# Patient Record
Sex: Female | Born: 2000 | Race: White | Hispanic: No | Marital: Single | Smoking: Never smoker
Health system: Southern US, Community
[De-identification: ages and names within clinical notes are randomized; demographics above are authoritative.]

## PROBLEM LIST (undated history)

## (undated) DIAGNOSIS — D649 Anemia, unspecified: Secondary | ICD-10-CM

## (undated) DIAGNOSIS — J309 Allergic rhinitis, unspecified: Secondary | ICD-10-CM

## (undated) DIAGNOSIS — G43D0 Abdominal migraine, not intractable: Secondary | ICD-10-CM

## (undated) DIAGNOSIS — E039 Hypothyroidism, unspecified: Secondary | ICD-10-CM

## (undated) DIAGNOSIS — T7840XA Allergy, unspecified, initial encounter: Secondary | ICD-10-CM

## (undated) HISTORY — DX: Allergy, unspecified, initial encounter: T78.40XA

## (undated) HISTORY — PX: WISDOM TOOTH EXTRACTION: SHX21

## (undated) HISTORY — DX: Allergic rhinitis, unspecified: J30.9

## (undated) HISTORY — PX: CHOLECYSTECTOMY: SHX55

## (undated) HISTORY — DX: Anemia, unspecified: D64.9

---
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
## 2019-04-02 ENCOUNTER — Other Ambulatory Visit: Payer: Self-pay

## 2019-04-02 NOTE — Progress Notes (Signed)
Prescreened pt over the phone for visit Doximity visit on Monday 4-20

## 2019-04-06 ENCOUNTER — Other Ambulatory Visit: Payer: Self-pay

## 2019-04-06 ENCOUNTER — Encounter: Payer: Self-pay | Admitting: Gastroenterology

## 2019-04-06 ENCOUNTER — Ambulatory Visit (INDEPENDENT_AMBULATORY_CARE_PROVIDER_SITE_OTHER): Payer: Medicaid Other | Admitting: Gastroenterology

## 2019-04-06 VITALS — Ht 67.0 in | Wt 155.0 lb

## 2019-04-06 DIAGNOSIS — R109 Unspecified abdominal pain: Secondary | ICD-10-CM | POA: Diagnosis not present

## 2019-04-06 DIAGNOSIS — F1210 Cannabis abuse, uncomplicated: Secondary | ICD-10-CM

## 2019-04-06 DIAGNOSIS — R112 Nausea with vomiting, unspecified: Secondary | ICD-10-CM | POA: Diagnosis not present

## 2019-04-06 DIAGNOSIS — Z8669 Personal history of other diseases of the nervous system and sense organs: Secondary | ICD-10-CM | POA: Diagnosis not present

## 2019-04-06 MED ORDER — NORTRIPTYLINE HCL 10 MG PO CAPS: 10.0000 mg | ORAL_CAPSULE | Freq: Every day | ORAL | 1 refills | Status: DC

## 2019-04-06 MED ORDER — OMEPRAZOLE 40 MG PO CPDR: 40.0000 mg | DELAYED_RELEASE_CAPSULE | Freq: Every day | ORAL | 3 refills | Status: DC

## 2019-04-06 MED ORDER — DICYCLOMINE HCL 10 MG PO CAPS: 10.0000 mg | ORAL_CAPSULE | Freq: Three times a day (TID) | ORAL | 2 refills | Status: DC | PRN

## 2019-04-06 MED ORDER — ONDANSETRON 4 MG PO TBDP: 4.0000 mg | ORAL_TABLET | Freq: Three times a day (TID) | ORAL | 1 refills | Status: DC | PRN

## 2019-04-06 NOTE — Patient Instructions (Addendum)
If you are age 19 or older, your body mass index should be between 23-30. Your Body mass index is 24.28 kg/m. If this is out of the aforementioned range listed, please consider follow up with your Primary Care Provider.  If you are age 3 or younger, your body mass index should be between 19-25. Your Body mass index is 24.28 kg/m. If this is out of the aformentioned range listed, please consider follow up with your Primary Care Provider.    We have sent the following medications to your pharmacy for you to pick up at your convenience: Zofran 4mg  ODT: Take every 8 hours as needed Bentyl 10mg : Take every 8 hours as needed Omeprazole 40mg : Take daily Nortriptyline 10mg : Take one tablet at bedtime for 2 weeks then increase to 2 tablets by mouth at bedtime  Please go to the lab in the basement of our building to have lab work done. Hit "B" for basement when you get on the elevator.  When the doors open the lab is on your left.  We will call you with the results. Thank you.  We have emailed you a handout regarding Cannabinoid Hyperemesis Syndrome. Let us know if you do not receive it.  Thank you for entrusting me with your care and for choosing [facility], Dr. Ileene Patrick

## 2019-04-06 NOTE — Progress Notes (Signed)
Virtual Visit via Video Note  I connected with Kathleen Grimes on 04/06/19 at  9:00 AM EDT by a video enabled telemedicine application and verified that I am speaking with the correct person using two identifiers.   I discussed the limitations of evaluation and management by telemedicine and the availability of in person appointments. The patient expressed understanding and agreed to proceed.   THIS ENCOUNTER IS A VIRTUAL VISIT DUE TO COVID-19 - PATIENT WAS NOT SEEN IN THE OFFICE. PATIENT HAS CONSENTED TO VIRTUAL VISIT / TELEMEDICINE VISIT USING DOXIMITY   Location of patient: home Location of provider: office Name of referring provider: [facility], no physician listed Persons participating: myself, patient   HPI :  18 y/o female with a prior reported history of "abdominal migraines", cyclical vomiting syndrome, migraine headaches, referred here for a new patient visit for nausea / vomiting, abdominal pains.   Patient reports years of chronic nausea / vomiting, abdominal pains dating back to childhood. She has been seen by pediatric GI in the past and carries a diagnosis of abdominal migraines / cyclical vomiting syndrome. Her symptoms are chronic and stable over time.   She occasionally has burning chest pain in her right chest at times. No dysphagia. She is bothered by borborygmi at times. She has nausea frequently which is usually postprandial, but can happen at any time. She feels nausea with all meals. She has vomiting about twice per month, it can happen at any time. She can have episodes of vomiting that are severe and can last for hours at a times. She has chronic abdominal discomfort, she feels cramps in lower abdomen frequently. Pains are below her umbilicus in the lower midline. She has fluctuating bowel habits, one day constipation and the other stools can be loose, and then normal times as well. No blood in the stools. She does have some reliable relief of  her lower pain with a bowel movement. Sometimes she has vomiting and nausea with bowel movements.   She has migraine headaches frequently, usually 5 out of 7 days of the week she will be bothered by headaches. She is not using anything preventatively to treat this. She denies any NSAID use.  She will go to the ED for symptoms when severe, seen in [facility], roughly 4 times since October. She has spells which can be severe with frequent vomiting.   She does not think she's had a CT scan in the past, only ultrasound which was done in 07/2014 and was normal. No gallstones.  Normal LFTs and CBC done on 03/30/19  No prior EGD or colonoscopy.  No FH of malignancy known.   She was given Zofran in the ED which helps her symptoms when she has them.   No tobacco use. No alcohol use. 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 She reports showers do not often help.     Past Medical History:  Diagnosis Date  . Abdominal migraine   . Allergic rhinitis 03/13/2013  . Allergy   . Anemia      Past Surgical History:  Procedure Laterality Date  . WISDOM TOOTH EXTRACTION     Family History  Problem Relation Age of Onset  . Breast cancer Paternal Grandmother   . Cancer Maternal Great-grandmother        bone marrow  . Colon cancer Neg Hx   .  Esophageal cancer Neg Hx   . Stomach cancer Neg Hx    Social History   Tobacco Use  . Smoking status: Never Smoker  . Smokeless tobacco: Never Used  Substance Use Topics  . Alcohol use: No  . Drug use: No   Current Outpatient Medications  Medication Sig Dispense Refill  . ondansetron (ZOFRAN-ODT) 4 MG disintegrating tablet Take 4 mg by mouth every 8 (eight) hours as needed for nausea or vomiting.    . potassium chloride (K-DUR) 10 MEQ tablet Take 10 mEq by mouth 2 (two) times daily.     No current facility-administered medications for this  visit.    No Known Allergies   Review of Systems: All systems reviewed and negative except where noted in HPI.   Labs per HPI  Lab Results  Component Value Date   WBC 11.4 08/02/2017   HGB 13.6 08/02/2017   HCT 39.8 08/02/2017   MCV 91.7 08/02/2017   PLT 340 08/02/2017    Lab Results  Component Value Date   CREATININE 0.66 08/02/2017   BUN 13 08/02/2017   NA 139 08/02/2017   K 4.0 08/02/2017   CL 104 08/02/2017   CO2 22 08/02/2017      Physical Exam: Ht 5\' 7"  (1.702 m) Comment: pt provided over the phone  Wt 155 lb (70.3 kg) Comment: pt provided over the phone  LMP 03/30/2019 (Exact Date)   BMI 24.28 kg/m   NA  ASSESSMENT AND PLAN:  18 y/o female here for a new patient consultation regarding the following:  Nausea with vomiting / abdominal pain / migraine headaches / marijuana use - I discussed the ddx with the patient. I'm concerned about her frequent marijuana use and possibility for cannabinoid hyperemesis syndrome. We discussed what this was and will email her some educational information on this. With all of these symptoms she should stop using marijuana completely until this is sorted out, she agreed with this. Otherwise I will screen her for H pylori using IgG serology, if positive will treat. She has not had a good trial of PPI that I can see, will give her omeprazole 40mg  once daily to take as well as Zofran PRN. Her migraine headaches are poorly controlled and that could also be driving some of her symptoms, I discussed options and think she is a good candidate for a TCA. Will try Nortryptylline to start, 10mg  qHS for 2 weeks then double dose to 20mg . Counseled her on risks / benefits and she wanted to try it. If this does not control migraines will have her see PCP or neurology for other options. Otherwise will try some bentyl to use PRN for cramps, suspect she could have IBS in regards to her low symptoms. Will also screen for celiac disease to ensure normal.    She agreed. Her labs are normal and reassuring. Will see how she responds to these measures and await her course. If no improvement moving forward, next stop is to consider imaging with CT or endoscopic evaluation. She agreed and will keep me updated if no better.  Ileene PatrickSteven Hansika Leaming, MD [facility]  CC: [facility]

## 2019-04-08 ENCOUNTER — Other Ambulatory Visit (INDEPENDENT_AMBULATORY_CARE_PROVIDER_SITE_OTHER): Payer: Medicaid Other

## 2019-04-08 DIAGNOSIS — F1210 Cannabis abuse, uncomplicated: Secondary | ICD-10-CM | POA: Diagnosis not present

## 2019-04-08 DIAGNOSIS — R109 Unspecified abdominal pain: Secondary | ICD-10-CM

## 2019-04-08 DIAGNOSIS — R112 Nausea with vomiting, unspecified: Secondary | ICD-10-CM

## 2019-04-08 DIAGNOSIS — Z8669 Personal history of other diseases of the nervous system and sense organs: Secondary | ICD-10-CM | POA: Diagnosis not present

## 2019-04-08 LAB — H. PYLORI ANTIBODY, IGG: H Pylori IgG: NEGATIVE

## 2019-04-08 LAB — IGA: IgA: 127 mg/dL (ref 68–378)

## 2019-04-10 LAB — TISSUE TRANSGLUTAMINASE, IGA: (tTG) Ab, IgA: 1 U/mL

## 2019-04-13 NOTE — Progress Notes (Unsigned)
Called patient and gave lab results. Patient states she has been taking the Omeprazole, zofran, Nortryptylline and bentyl for 1 week and it is helping. She has cut back on her marijuana use from 3gm daily to 1 gm daily. Told her that was good, but continue to try to stop

## 2019-04-14 IMAGING — US ULTRASOUND RIGHT BREAST LIMITED
1 series · 2 of 2 positions shown · non-contrast
Comparison: None.

CLINICAL DATA: 17-year-old female with a palpable abnormality
involving the upper-outer quadrant of the right breast.

EXAM:
ULTRASOUND OF THE RIGHT BREAST

[Series 1: ultrasound right breast limited · 0.07mm/px · 2 of 2 slices shown]
[im 1/2]
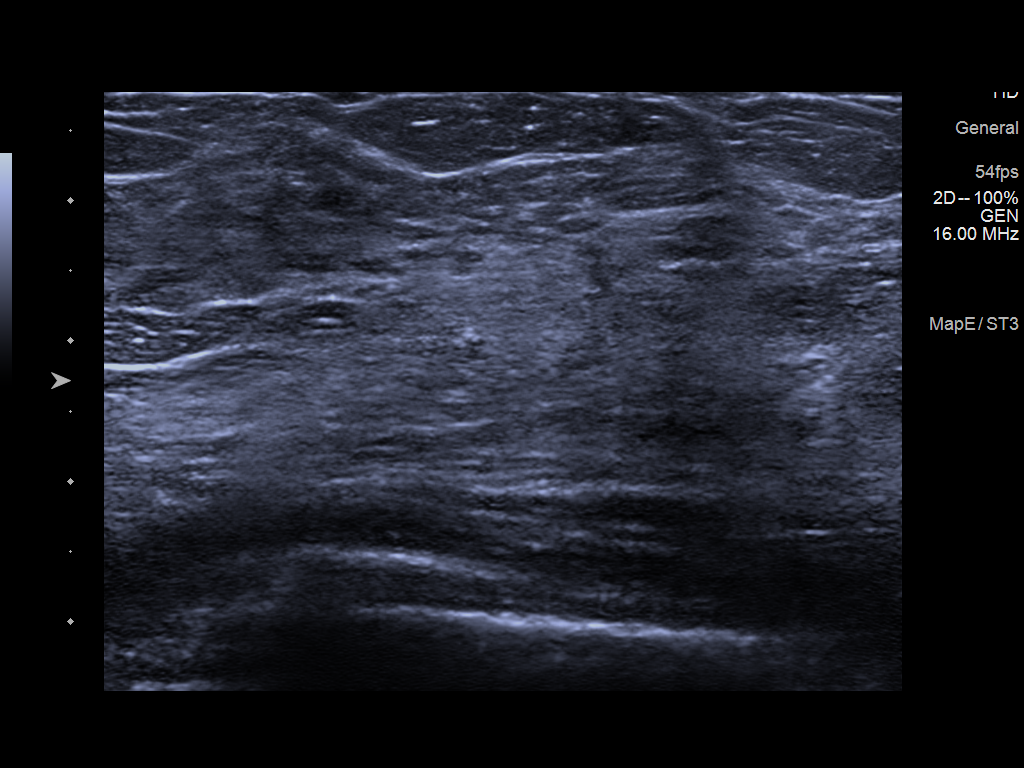
[im 2/2]
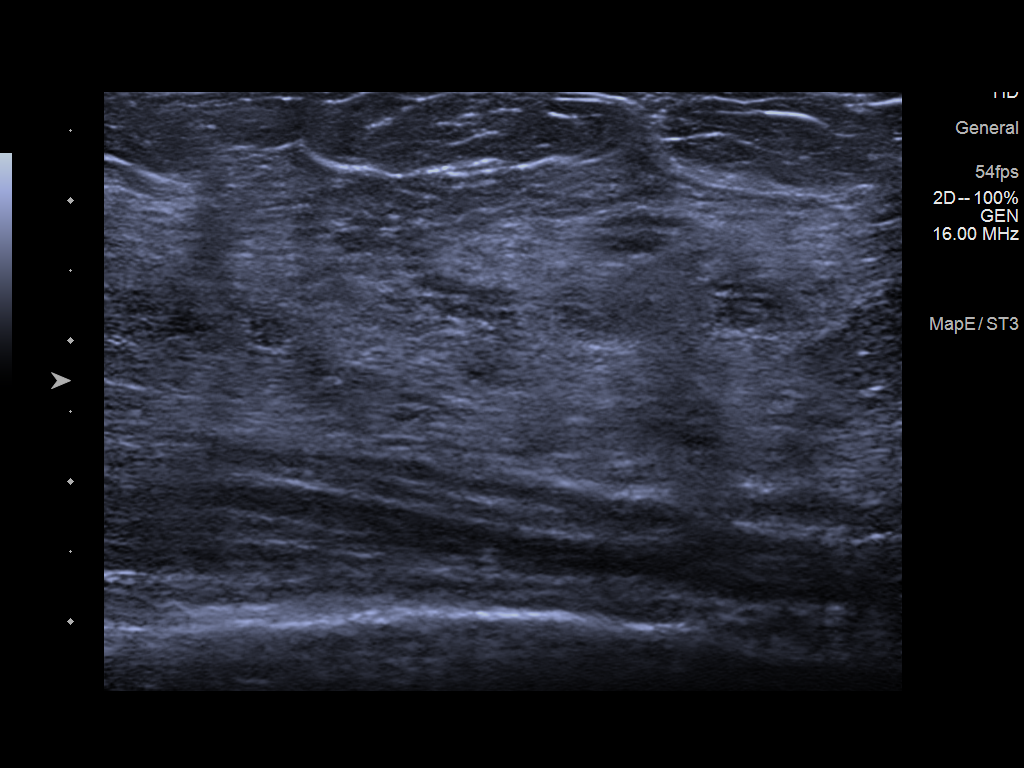

[2 of 2 positions shown; findings below may reference images not displayed]

FINDINGS: Physical examination site of palpable concern reveals a large area
of thickening involving the upper-outer right breast. No discrete
masses.

Targeted ultrasound of the upper-outer right breast was performed.
No suspicious masses or abnormality seen, only extremely dense
fibroglandular tissue identified.
IMPRESSION: No abnormalities identified at site of palpable concern in the
upper-outer right breast. This palpable area of concern could be due
to extremely dense fibroglandular tissue in this location.

RECOMMENDATION:
1. Recommend further management of the right breast palpable
abnormality be based on clinical assessment.

2. Screening mammogram at age 40 unless there are persistent or
intervening clinical concerns. (Code:QG-I-BI9)

I have discussed the findings and recommendations with the patient.
Results were also provided in writing at the conclusion of the
visit. If applicable, a reminder letter will be sent to the patient
regarding the next appointment.

BI-RADS CATEGORY  1: Negative.

## 2019-05-15 IMAGING — DX DG CHEST 2V
2 series · 2 of 2 positions shown · non-contrast
Comparison: July 25, 2017

CLINICAL DATA: Wheezing

EXAM:
CHEST - 2 VIEW

[chest pa]
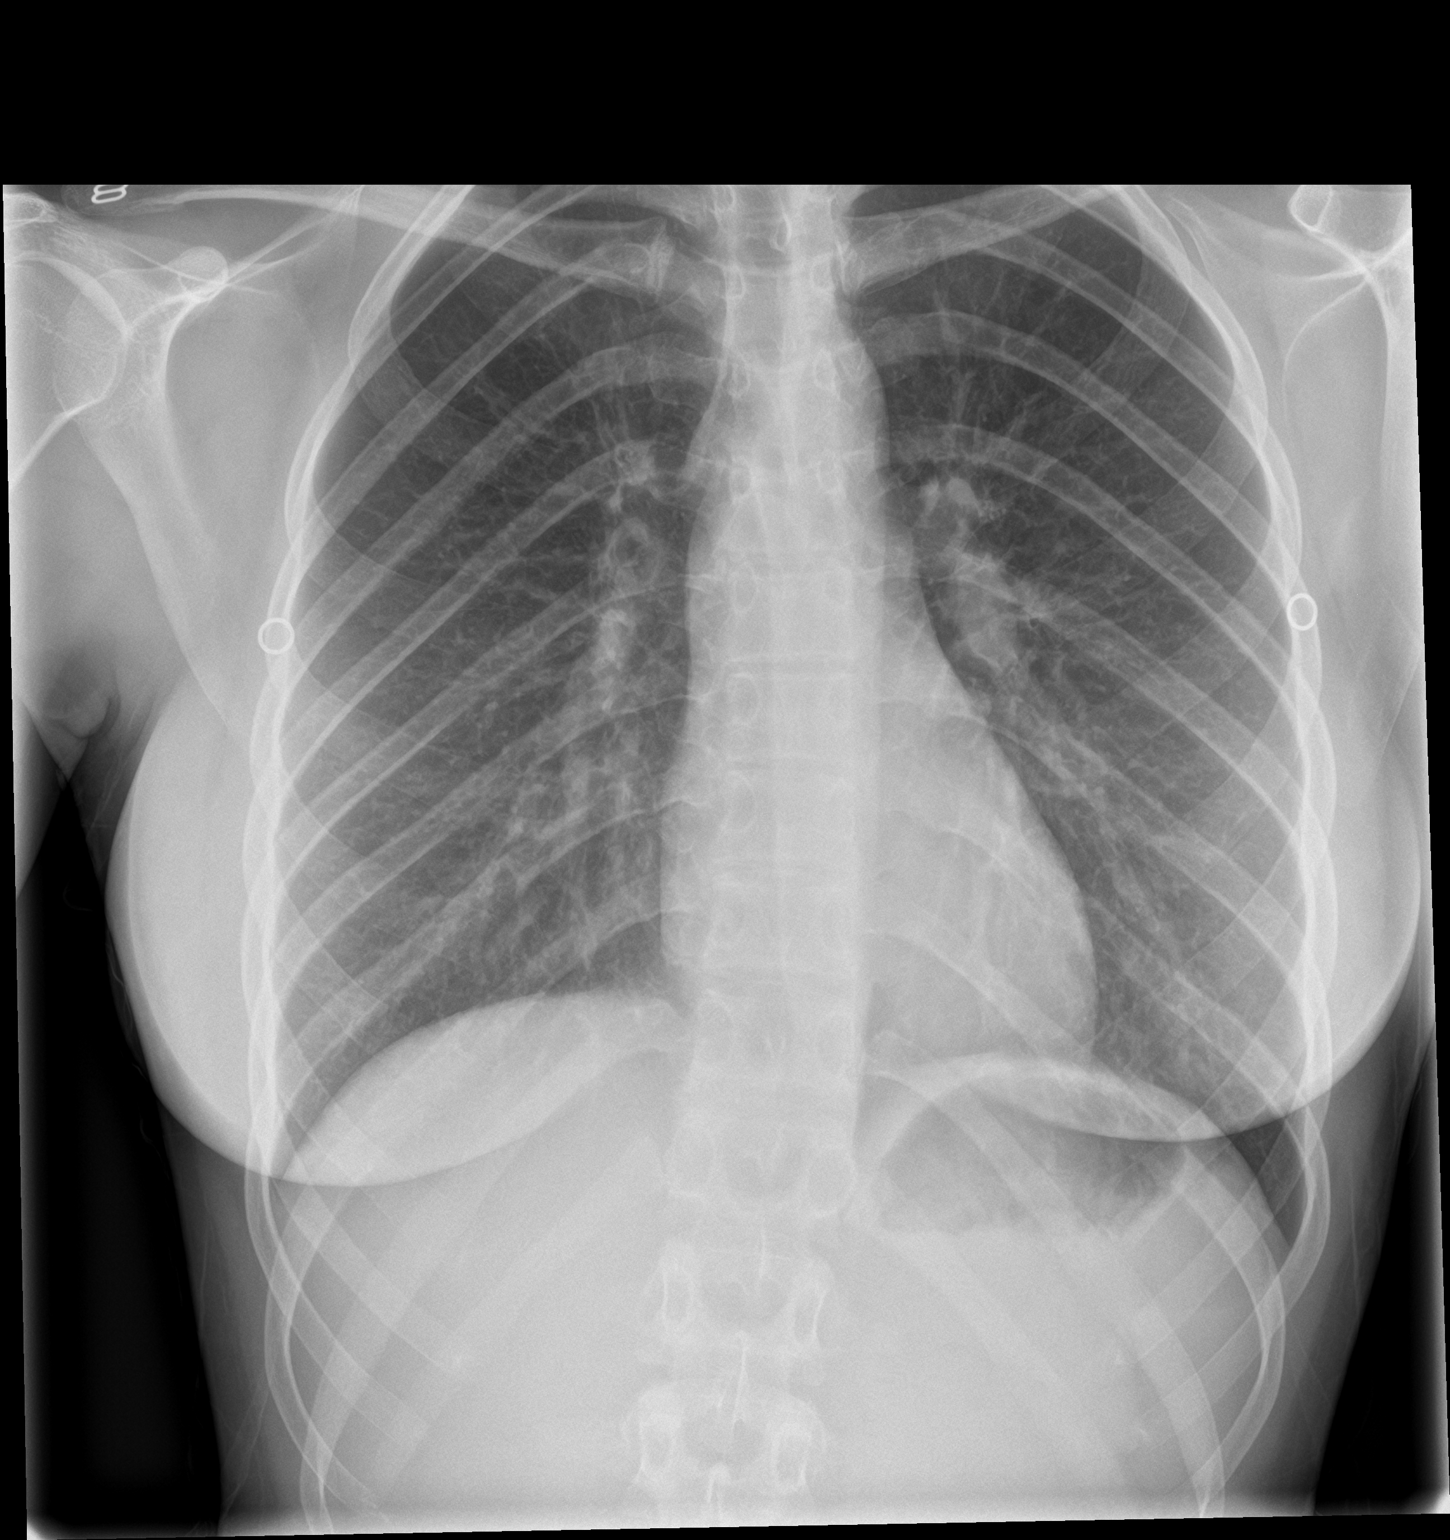

[chest lat]
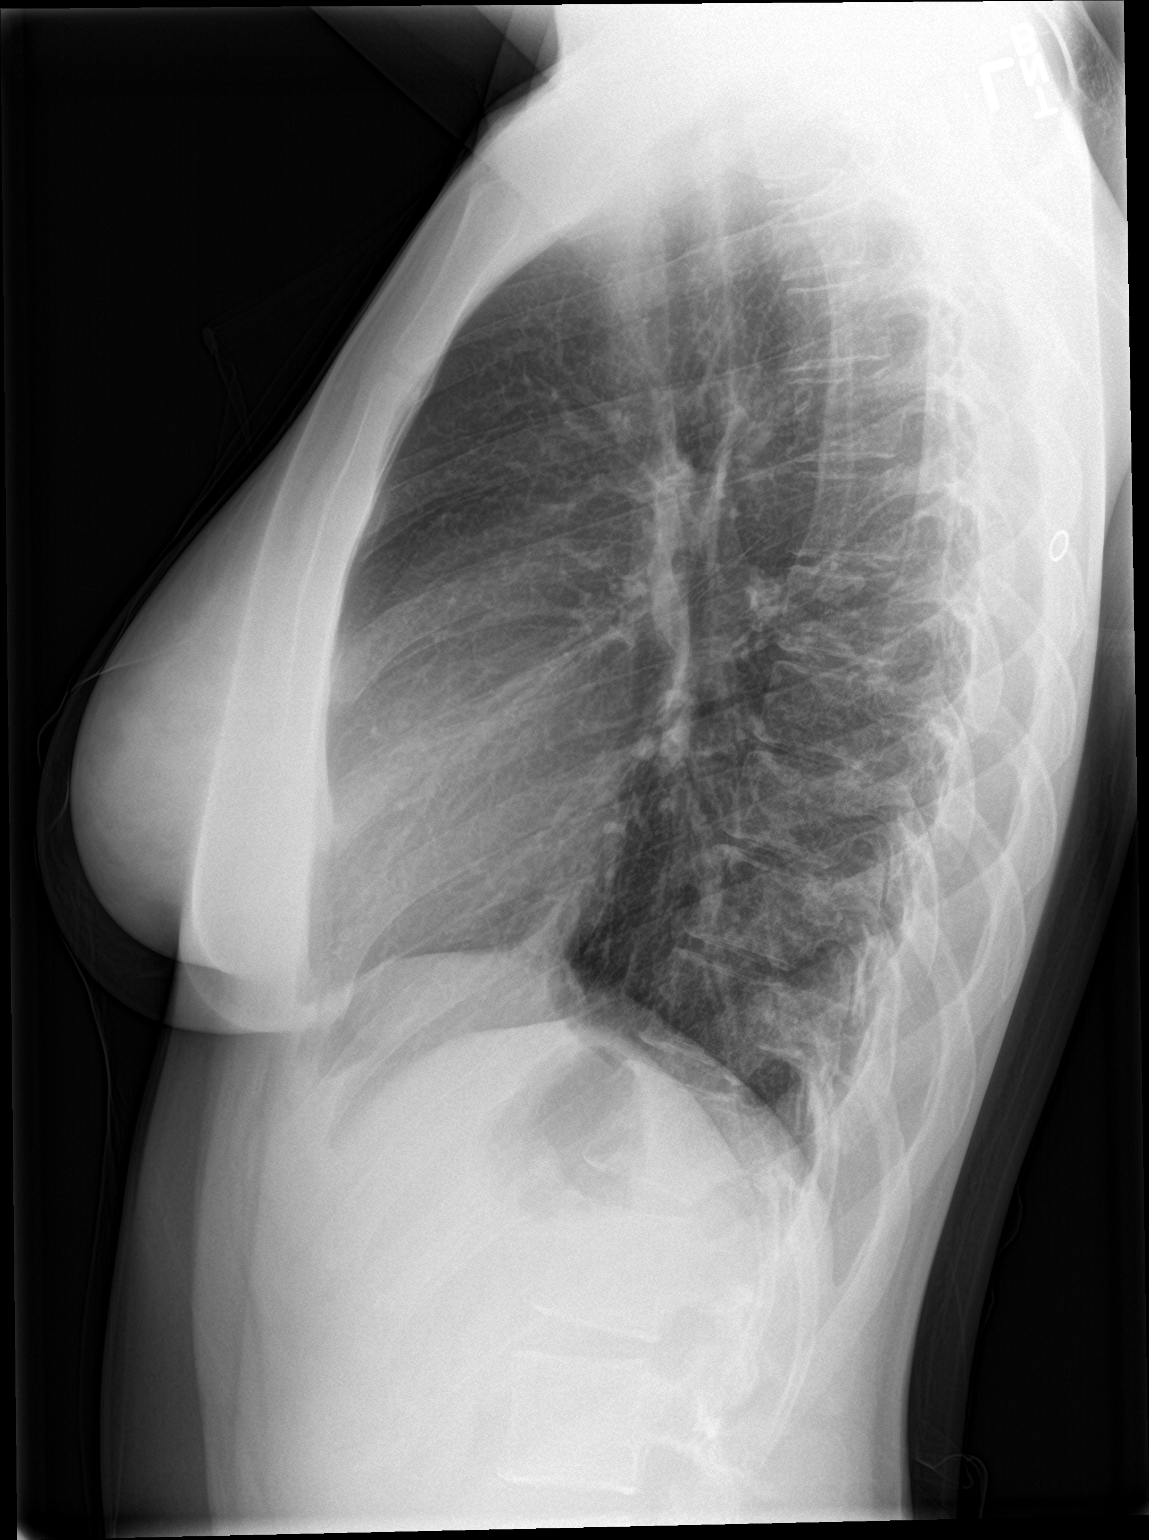

[2 of 2 positions shown; findings below may reference images not displayed]

FINDINGS: The lungs are clear. The heart size and pulmonary vascularity are
normal. No adenopathy. No bone lesions.
IMPRESSION: No edema or consolidation.

## 2019-10-22 ENCOUNTER — Other Ambulatory Visit: Payer: Self-pay

## 2019-10-22 ENCOUNTER — Encounter (HOSPITAL_COMMUNITY): Payer: Self-pay

## 2019-10-22 ENCOUNTER — Emergency Department (HOSPITAL_COMMUNITY)
Admission: EM | Admit: 2019-10-22 | Discharge: 2019-10-22 | Disposition: A | Discharge status: Home / Self Care | Payer: Medicaid Other | Attending: Emergency Medicine | Admitting: Emergency Medicine

## 2019-10-22 DIAGNOSIS — R1110 Vomiting, unspecified: Secondary | ICD-10-CM | POA: Diagnosis not present

## 2019-10-22 DIAGNOSIS — Z5321 Procedure and treatment not carried out due to patient leaving prior to being seen by health care provider: Secondary | ICD-10-CM | POA: Insufficient documentation

## 2019-10-22 DIAGNOSIS — R109 Unspecified abdominal pain: Secondary | ICD-10-CM | POA: Diagnosis present

## 2019-10-22 NOTE — ED Notes (Signed)
Called for Pt in waiting room. No answer.

## 2019-10-22 NOTE — ED Triage Notes (Signed)
Pt arrives from home via POV c/o severe abdominal pain and vomiting. Pt presents anxious and distressed. Pt states she lost count of how many times she has vomitted. Pt states she needs pain medicine and needs to be seen as soon as possible.

## 2019-12-18 NOTE — L&D Delivery Note (Addendum)
LABOR COURSE Patient was an induction for post dates. Labor course was uncomplicated. S/p cytotec x1 and pitocin. Patient progressed to complete shortly after.   Delivery Note Called to room and patient was complete and pushing. Head delivered OA. No nuchal cord present. Shoulder and body delivered in usual fashion. At 1933 a healthy female was delivered via Vaginal, Spontaneous (Presentation:LOA).  Infant with spontaneous cry, placed on mother's abdomen, dried and stimulated. Cord clamped x 2 after 1-minute delay, and cut by Grandmother of baby. Cord blood drawn. Placenta delivered spontaneously with gentle cord traction. Appears intact. Fundus firm with massage and Pitocin. Labia, perineum, vagina, and cervix inspected with first degree perineal laceration.   APGAR: 8,9 ; weight per medical record Cord: 3 vessel cord, membranes are intact.    Anesthesia: IV fentanyl, lidocaine for lac repair Episiotomy: None Lacerations: First degree perineal laceration, repaired in standard fashion without complication Suture Repair: 3.0 vicryl Est. Blood Loss (mL): 75 ml  Mom to postpartum.  Baby to Couplet care / Skin to Skin.  Betti Cruz, MD PGY-1 11/14/2020 8:07 PM    I was gloved and present for delivery of infant and placenta. I assisted with laceration repair as noted above and agree with resident's documentation.  Sheila Oats, MD OB Fellow, Faculty Practice 11/14/2020 9:22 PM

## 2020-03-17 ENCOUNTER — Other Ambulatory Visit: Payer: Self-pay | Admitting: Obstetrics and Gynecology

## 2020-03-17 DIAGNOSIS — O3680X0 Pregnancy with inconclusive fetal viability, not applicable or unspecified: Secondary | ICD-10-CM

## 2020-03-21 ENCOUNTER — Other Ambulatory Visit: Payer: Self-pay

## 2020-03-21 ENCOUNTER — Ambulatory Visit (INDEPENDENT_AMBULATORY_CARE_PROVIDER_SITE_OTHER): Payer: Medicaid Other

## 2020-03-21 DIAGNOSIS — O3680X0 Pregnancy with inconclusive fetal viability, not applicable or unspecified: Secondary | ICD-10-CM | POA: Diagnosis not present

## 2020-03-21 DIAGNOSIS — Z3A01 Less than 8 weeks gestation of pregnancy: Secondary | ICD-10-CM | POA: Diagnosis not present

## 2020-03-21 NOTE — Progress Notes (Signed)
[name] 7+1 wks,single IUP with YS,positive FHT 132 bpm,normal ovaries,CRL 11.10 mm

## 2020-03-24 ENCOUNTER — Emergency Department (HOSPITAL_COMMUNITY)
Admission: EM | Admit: 2020-03-24 | Discharge: 2020-03-24 | Disposition: A | Discharge status: Home / Self Care | Payer: Medicaid Other | Attending: Emergency Medicine | Admitting: Emergency Medicine

## 2020-03-24 ENCOUNTER — Other Ambulatory Visit: Payer: Self-pay

## 2020-03-24 ENCOUNTER — Telehealth: Payer: Self-pay | Admitting: Women's Health

## 2020-03-24 ENCOUNTER — Encounter (HOSPITAL_COMMUNITY): Payer: Self-pay | Admitting: *Deleted

## 2020-03-24 ENCOUNTER — Emergency Department (HOSPITAL_COMMUNITY): Payer: Medicaid Other

## 2020-03-24 DIAGNOSIS — R7989 Other specified abnormal findings of blood chemistry: Secondary | ICD-10-CM

## 2020-03-24 DIAGNOSIS — E876 Hypokalemia: Secondary | ICD-10-CM

## 2020-03-24 DIAGNOSIS — Z79899 Other long term (current) drug therapy: Secondary | ICD-10-CM | POA: Insufficient documentation

## 2020-03-24 DIAGNOSIS — O219 Vomiting of pregnancy, unspecified: Secondary | ICD-10-CM | POA: Diagnosis present

## 2020-03-24 DIAGNOSIS — Z3A01 Less than 8 weeks gestation of pregnancy: Secondary | ICD-10-CM | POA: Insufficient documentation

## 2020-03-24 DIAGNOSIS — R945 Abnormal results of liver function studies: Secondary | ICD-10-CM | POA: Insufficient documentation

## 2020-03-24 LAB — URINALYSIS, ROUTINE W REFLEX MICROSCOPIC
Glucose, UA: NEGATIVE mg/dL
Hgb urine dipstick: NEGATIVE
Ketones, ur: 80 mg/dL — AB
Nitrite: NEGATIVE
Protein, ur: 30 mg/dL — AB
Specific Gravity, Urine: 1.028 (ref 1.005–1.030)
pH: 6 (ref 5.0–8.0)

## 2020-03-24 LAB — CBC WITH DIFFERENTIAL/PLATELET
Abs Immature Granulocytes: 0.04 10*3/uL (ref 0.00–0.07)
Basophils Absolute: 0 10*3/uL (ref 0.0–0.1)
Basophils Relative: 0 %
Eosinophils Absolute: 0 10*3/uL (ref 0.0–0.5)
Eosinophils Relative: 0 %
HCT: 39.7 % (ref 36.0–46.0)
Hemoglobin: 14.4 g/dL (ref 12.0–15.0)
Immature Granulocytes: 1 %
Lymphocytes Relative: 11 %
Lymphs Abs: 0.9 10*3/uL (ref 0.7–4.0)
MCH: 32.7 pg (ref 26.0–34.0)
MCHC: 36.3 g/dL — ABNORMAL HIGH (ref 30.0–36.0)
MCV: 90 fL (ref 80.0–100.0)
Monocytes Absolute: 0.9 10*3/uL (ref 0.1–1.0)
Monocytes Relative: 10 %
Neutro Abs: 6.9 10*3/uL (ref 1.7–7.7)
Neutrophils Relative %: 78 %
Platelets: 270 10*3/uL (ref 150–400)
RBC: 4.41 MIL/uL (ref 3.87–5.11)
RDW: 11.9 % (ref 11.5–15.5)
WBC: 8.8 10*3/uL (ref 4.0–10.5)
nRBC: 0 % (ref 0.0–0.2)

## 2020-03-24 LAB — COMPREHENSIVE METABOLIC PANEL
ALT: 305 U/L — ABNORMAL HIGH (ref 0–44)
AST: 188 U/L — ABNORMAL HIGH (ref 15–41)
Albumin: 4.9 g/dL (ref 3.5–5.0)
Alkaline Phosphatase: 61 U/L (ref 38–126)
Anion gap: 12 (ref 5–15)
BUN: 14 mg/dL (ref 6–20)
CO2: 22 mmol/L (ref 22–32)
Calcium: 9.6 mg/dL (ref 8.9–10.3)
Chloride: 100 mmol/L (ref 98–111)
Creatinine, Ser: 0.49 mg/dL (ref 0.44–1.00)
GFR calc Af Amer: >60 mL/min (ref >60)
GFR calc non Af Amer: >60 mL/min (ref >60)
Glucose, Bld: 92 mg/dL (ref 70–99)
Potassium: 3 mmol/L — ABNORMAL LOW (ref 3.5–5.1)
Sodium: 134 mmol/L — ABNORMAL LOW (ref 135–145)
Total Bilirubin: 5 mg/dL — ABNORMAL HIGH (ref 0.3–1.2)
Total Protein: 8 g/dL (ref 6.5–8.1)

## 2020-03-24 MED ORDER — LACTATED RINGERS IV BOLUS
500.0000 mL | Freq: Once | INTRAVENOUS | Status: AC
  Administered 2020-03-24: 500 mL via INTRAVENOUS

## 2020-03-24 MED ORDER — POTASSIUM CHLORIDE CRYS ER 20 MEQ PO TBCR
40.0000 meq | EXTENDED_RELEASE_TABLET | Freq: Once | ORAL | Status: AC
  Administered 2020-03-24: 40 meq via ORAL
  Filled 2020-03-24: qty 2

## 2020-03-24 MED ORDER — ONDANSETRON HCL 4 MG/2ML IJ SOLN
4.0000 mg | Freq: Once | INTRAMUSCULAR | Status: AC
  Administered 2020-03-24: 12:00:00 4 mg via INTRAVENOUS
  Filled 2020-03-24: qty 2

## 2020-03-24 MED ORDER — POTASSIUM CHLORIDE CRYS ER 20 MEQ PO TBCR: 40.0000 meq | EXTENDED_RELEASE_TABLET | Freq: Every day | ORAL | 0 refills | Status: DC

## 2020-03-24 MED ORDER — LACTATED RINGERS IV BOLUS
1000.0000 mL | Freq: Once | INTRAVENOUS | Status: AC
  Administered 2020-03-24: 12:00:00 1000 mL via INTRAVENOUS

## 2020-03-24 MED ORDER — CEPHALEXIN 500 MG PO CAPS
500.0000 mg | ORAL_CAPSULE | Freq: Once | ORAL | Status: AC
  Administered 2020-03-24: 16:00:00 500 mg via ORAL
  Filled 2020-03-24: qty 1

## 2020-03-24 MED ORDER — CEPHALEXIN 500 MG PO CAPS: 500.0000 mg | ORAL_CAPSULE | Freq: Two times a day (BID) | ORAL | 0 refills | Status: AC

## 2020-03-24 NOTE — Telephone Encounter (Signed)
Pt states she went to the ER and she states they could not give her fluids because the ER was packed. They did call her in zofran but she is still not able to keep anything down. Pt would like to speak with the nurse.

## 2020-03-24 NOTE — Discharge Instructions (Addendum)
You have been seen today for nausea and vomiting. Please read and follow all provided instructions. Return to the emergency room for worsening condition or new concerning symptoms.    Do NOT take any Tylenol until you have your liver function retested.  1. Medications:  -Prescription sent to your pharmacy for Keflex.  This is used to treat urinary tract infections.  Please take this as prescribed. -Prescription also sent for potassium pills.  Your potassium was low today so please take these as prescribed.  Continue usual home medications Take medications as prescribed. Please review all of the medicines and only take them if you do not have an allergy to them.   2. Treatment: rest, drink plenty of fluids  3. Follow Up:  Please follow up with primary care provider by scheduling an appointment in 5 days (after finishing potassium pills) to have your potassium, liver enzymes, rechecked and your bilirubin  -Also recommend you follow-up with the on-call general surgeon Dr. Henreitta Leber.  I have given you her contact information.  Please call to schedule next available follow-up appointment.    -You can also follow up with the GI (stomach doctors) to talk about your elevated bilirubin to talk about your gallbladder sludge. When you call you should say this is an emergency department follow-up visit.    It is also a possibility that you have an allergic reaction to any of the medicines that you have been prescribed - Everybody reacts differently to medications and while MOST people have no trouble with most medicines, you may have a reaction such as nausea, vomiting, rash, swelling, shortness of breath. If this is the case, please stop taking the medicine immediately and contact your physician.  ?

## 2020-03-24 NOTE — ED Provider Notes (Signed)
[facility] EMERGENCY DEPARTMENT Provider Note   CSN: [id number] Arrival date & time: 03/24/20  1039     History Chief Complaint  Patient presents with  . Emesis During Pregnancy    Kathleen Grimes is a 19 y.o. female G1P0 currently 7 weeks 4 days gestation presents to emergency department today with chief complaint of emesis x3 days.  Patient states she has not able to tolerate any p.o. intake.  She is endorsing generalized weakness and fatigue.  She went to outside ED yesterday and was given prescription for Diclegis and Phenergan.  She last took Phenergan 12 hours ago and has not had any vomiting since. Prior to taking phenergan she had 10+ episodes of non bloody non bilious emesis She thinks she is dehydrated after noticing her urine was dark in color. She denies fever, chills, chest pain, shortness of breath, abdominal pain, gross hematuria, urinary frequency, dysuria, diarrhea, vaginal bleeding, vaginal discharge, pelvic pain. Patient's OB is Family Electrical engineer. Chart review shows she had an ultrasound on 03/21/2020 that showed single viable IUP.  History provided by patient with additional history obtained from chart review.      Past Medical History:  Diagnosis Date  . Abdominal migraine   . Allergic rhinitis 03/13/2013  . Allergy   . Anemia     Patient Active Problem List   Diagnosis Date Noted  . Allergic rhinitis 03/13/2013    Past Surgical History:  Procedure Laterality Date  . WISDOM TOOTH EXTRACTION       OB History    Gravida  1   Para      Term      Preterm      AB      Living        SAB      TAB      Ectopic      Multiple      Live Births              Family History  Problem Relation Age of Onset  . Breast cancer Paternal Grandmother   . Cancer Maternal Great-grandmother        bone marrow  . Colon cancer Neg Hx   . Esophageal cancer Neg Hx   . Stomach cancer Neg Hx     Social History   Tobacco Use  . Smoking status: Never  Smoker  . Smokeless tobacco: Never Used  Substance Use Topics  . Alcohol use: No  . Drug use: No    Home Medications Prior to Admission medications   Medication Sig Start Date End Date Taking? Authorizing Provider  Doxylamine-Pyridoxine (DICLEGIS) 10-10 MG TBEC Take 1 tablet by mouth at bedtime.   Yes [provider]  prenatal vitamin w/FE, FA (PRENATAL 1 + 1) 27-1 MG TABS tablet Take 1 tablet by mouth daily at 12 noon.   Yes [provider]  promethazine (PHENERGAN) 25 MG tablet Take 25 mg by mouth every 6 (six) hours as needed for nausea or vomiting.   Yes [provider]  cephALEXin (KEFLEX) 500 MG capsule Take 1 capsule (500 mg total) by mouth 2 (two) times daily for 7 days. 03/24/20 03/31/20  Mykenna Viele E, PA-C  dicyclomine (BENTYL) 10 MG capsule Take 1 capsule (10 mg total) by mouth every 8 (eight) hours as needed for spasms. Patient not taking: Reported on 03/24/2020 04/06/19   Benancio Deeds, MD  nortriptyline (PAMELOR) 10 MG capsule Take 1 capsule (10 mg total) by mouth  at bedtime for 14 days. Then increase to 2 tablets (20mg ) by mouth at bedtime Patient not taking: Reported on 03/24/2020 04/06/19 04/20/19  06/20/19, MD  omeprazole (PRILOSEC) 40 MG capsule Take 1 capsule (40 mg total) by mouth daily. Patient not taking: Reported on 03/24/2020 04/06/19   04/08/19, MD  ondansetron (ZOFRAN-ODT) 4 MG disintegrating tablet Take 1 tablet (4 mg total) by mouth every 8 (eight) hours as needed for nausea or vomiting. Patient not taking: Reported on 03/24/2020 04/06/19   04/08/19, MD  potassium chloride SA (KLOR-CON) 20 MEQ tablet Take 2 tablets (40 mEq total) by mouth daily for 5 days. 03/24/20 03/29/20  Oluwademilade Mckiver, 03/31/20, PA-C    Allergies    Patient has no known allergies.  Review of Systems   Review of Systems  All other systems are reviewed and are negative for acute change except as noted in the HPI.   Physical  Exam Updated Vital Signs BP 105/67 (BP Location: Right Arm)   Pulse 71   Temp 98.3 F (36.8 C) (Oral)   Resp 14   Ht 5\' 7"  (1.702 m)   Wt 59 kg   LMP  (LMP Unknown)   SpO2 100%   BMI 20.36 kg/m   Physical Exam Vitals and nursing note reviewed.  Constitutional:      General: She is not in acute distress.    Appearance: She is not ill-appearing.  HENT:     Head: Normocephalic and atraumatic.     Right Ear: Tympanic membrane and external ear normal.     Left Ear: Tympanic membrane and external ear normal.     Nose: Nose normal.     Mouth/Throat:     Mouth: Mucous membranes are dry.     Pharynx: Oropharynx is clear.  Eyes:     General: No scleral icterus.       Right eye: No discharge.        Left eye: No discharge.     Extraocular Movements: Extraocular movements intact.     Conjunctiva/sclera: Conjunctivae normal.     Pupils: Pupils are equal, round, and reactive to light.  Neck:     Vascular: No JVD.  Cardiovascular:     Rate and Rhythm: Normal rate and regular rhythm.     Pulses: Normal pulses.          Radial pulses are 2+ on the right side and 2+ on the left side.     Heart sounds: Normal heart sounds.  Pulmonary:     Comments: Lungs clear to auscultation in all fields. Symmetric chest rise. No wheezing, rales, or rhonchi. Abdominal:     Tenderness: There is no right CVA tenderness or left CVA tenderness.     Comments: Abdomen is soft, non-distended, and non-tender in all quadrants. No rigidity, no guarding. No peritoneal signs.  Musculoskeletal:        General: Normal range of motion.     Cervical back: Normal range of motion.  Skin:    General: Skin is warm and dry.     Capillary Refill: Capillary refill takes less than 2 seconds.  Neurological:     Mental Status: She is oriented to person, place, and time.     GCS: GCS eye subscore is 4. GCS verbal subscore is 5. GCS motor subscore is 6.     Comments: Fluent speech, no facial droop.  Psychiatric:         Behavior: Behavior normal.  ED Results / Procedures / Treatments   Labs (all labs ordered are listed, but only abnormal results are displayed) Labs Reviewed  CBC WITH DIFFERENTIAL/PLATELET - Abnormal; Notable for the following components:      Result Value   MCHC 36.3 (*)    All other components within normal limits  COMPREHENSIVE METABOLIC PANEL - Abnormal; Notable for the following components:   Sodium 134 (*)    Potassium 3.0 (*)    AST 188 (*)    ALT 305 (*)    Total Bilirubin 5.0 (*)    All other components within normal limits  URINALYSIS, ROUTINE W REFLEX MICROSCOPIC - Abnormal; Notable for the following components:   Color, Urine AMBER (*)    APPearance CLOUDY (*)    Bilirubin Urine SMALL (*)    Ketones, ur 80 (*)    Protein, ur 30 (*)    Leukocytes,Ua MODERATE (*)    Bacteria, UA RARE (*)    Non Squamous Epithelial 0-5 (*)    All other components within normal limits  URINE CULTURE    EKG None  Radiology US Abdomen Complete  Result Date: 03/24/2020 CLINICAL DATA:  Elevated liver function tests. EXAM: ABDOMEN ULTRASOUND COMPLETE COMPARISON:  08/03/2014 FINDINGS: Gallbladder: No shadowing gallstones. Low level echoes noted within the gallbladder lumen consistent with sludge. Small dependent hypoechoic lesions in the lower gallbladder/gallbladder neck may reflect polyps focal areas of tumefactive sludge. No wall thickening. No pericholecystic fluid. Common bile duct: Diameter: 4 mm Liver: No focal lesion identified. Within normal limits in parenchymal echogenicity. Portal vein is patent on color Doppler imaging with normal direction of blood flow towards the liver. IVC: No abnormality visualized. Pancreas: Visualized portion unremarkable. Spleen: Size and appearance within normal limits. Right Kidney: Length: 10.7. Echogenicity within normal limits. No mass or hydronephrosis visualized. Left Kidney: Length: 11.0. Echogenicity within normal limits. No mass or  hydronephrosis visualized. Abdominal aorta: No aneurysm visualized. Other findings: None. IMPRESSION: 1. No acute findings. 2. Gallbladder sludge with small polyps versus tumefactive sludge, but no shadowing stones. 3. No other abnormalities. Normal sonographic appearance of the liver. Electronically Signed   By: Amie Portland M.D.   On: 03/24/2020 15:03    Procedures Procedures (including critical care time)  Medications Ordered in ED Medications  lactated ringers bolus 1,000 mL (0 mLs Intravenous Stopped 03/24/20 1340)  ondansetron (ZOFRAN) injection 4 mg (4 mg Intravenous Given 03/24/20 1135)  lactated ringers bolus 500 mL (0 mLs Intravenous Stopped 03/24/20 1615)  potassium chloride SA (KLOR-CON) CR tablet 40 mEq (40 mEq Oral Given 03/24/20 1626)  cephALEXin (KEFLEX) capsule 500 mg (500 mg Oral Given 03/24/20 1627)    ED Course  I have reviewed the triage vital signs and the nursing notes.  Pertinent labs & imaging results that were available during my care of the patient were reviewed by me and considered in my medical decision making (see chart for details).    MDM Rules/Calculators/A&P                      Patient seen and examined. Patient presents awake, alert, hemodynamically stable, afebrile, non toxic.  On exam patient appears dehydrated, she has dry mucous membranes.  No abdominal tenderness, no peritoneal signs.  No CVA tenderness. Labs today show no leukocytosis, no anemia.  She does appear to have mild hypokalemia of 3.0, will replete with p.o. as well as elevated liver enzymes, AST is 188 and ALT is 305, her total bilirubin is  5.0.  Patient has no history of similar elevation in the past.  Serial abdominal exams have been benign.  She took Tylenol regular strength 2 times last week for a headache, she typically does not take it very often.  1500 mL of LR and zofran given.  She has had no further episodes of emesis and she is tolerating PO intake. Findings and plan of care discussed  with supervising physician Dr. Rogene Houston who agrees with plan for ultrasound of her abdomen. UA with possible infection with moderate leukocytes, 11-20 WBC, also suggestive of contaminated sample with 11-20 squamous epithelial cells.  Will send for culture.  Patient does appear to be dehydrated with 80 ketones in her urine. Also with protein 30 and small bilirubin. Will treat for UTI in the setting of pregnancy.  Abdominal ultrasound shows sludge in the gallbladder without gallstones. Liver is normal appearing.  On reassessment patient is resting comfortably.  I informed her of the imaging findings today.  Given she serial benign abdominal exams do not think this is an acute surgical abdomen.  Will give patient information to follow-up outpatient with surgery and GI if needed for further work up.  Also discharged with prescription for potassium and will have patient follow-up with PCP to have this rechecked as well as rechecking her liver enzymes.  The patient appears reasonably screened and/or stabilized for discharge and I doubt any other medical condition or other [facility] requiring further screening, evaluation, or treatment in the ED at this time prior to discharge. The patient is safe for discharge with strict return precautions discussed. Patient appears reliable for follow up.    Portions of this note were generated with [employer]. Dictation errors may occur despite best attempts at proofreading.   Final Clinical Impression(s) / ED Diagnoses Final diagnoses:  Nausea and vomiting in pregnancy  Hypokalemia  Elevated liver function tests    Rx / DC Orders ED Discharge Orders         Ordered    cephALEXin (KEFLEX) 500 MG capsule  2 times daily     03/24/20 1542    potassium chloride SA (KLOR-CON) 20 MEQ tablet  Daily     03/24/20 1542           Ivionna Verley, Harley Hallmark, PA-C 03/24/20 1639    Fredia Sorrow, MD 03/25/20 [phone]

## 2020-03-24 NOTE — Telephone Encounter (Signed)
Pt went to [facility] yesterday due to vomiting with pregnancy. Pt states ER was to busy to give her fluids. Was given prescription for Diclegis and Phenergan. Mouth is not moist. Pt is urinating 3-4 times a day; urine is dark. Pt was advised to go to [facility] ER for eval and possible fluids. Pt voiced understanding. JSY

## 2020-03-24 NOTE — ED Triage Notes (Signed)
Pt states she can keep some fluids down but not any food for days.  Pt states she is [redacted] weeks pregnant.

## 2020-03-26 LAB — URINE CULTURE: Culture: <10000 — AB

## 2020-03-28 ENCOUNTER — Telehealth: Payer: Self-pay

## 2020-03-28 NOTE — Telephone Encounter (Signed)
Pt called to schedule an apt. Pt states that she hasn't been seen at [facility]. Please advise. [phone]

## 2020-03-28 NOTE — Telephone Encounter (Signed)
Pt saw Dr.Armbruster at [facility] on 04/06/19 for a virtual visit. She has established care with them and we cannot accept her.

## 2020-03-28 NOTE — Telephone Encounter (Signed)
Pt is established with [facility].

## 2020-03-28 NOTE — Telephone Encounter (Signed)
Spoke with pt. Pt was notified that we cannot accept her at our office due to her establishing care with [facility].

## 2020-03-28 NOTE — Telephone Encounter (Signed)
Can we accept her as a new patient. Seen in the [phone]

## 2020-03-30 ENCOUNTER — Ambulatory Visit (INDEPENDENT_AMBULATORY_CARE_PROVIDER_SITE_OTHER): Payer: Medicaid Other | Admitting: Nurse Practitioner

## 2020-03-30 ENCOUNTER — Encounter: Payer: Self-pay | Admitting: Nurse Practitioner

## 2020-03-30 ENCOUNTER — Other Ambulatory Visit (INDEPENDENT_AMBULATORY_CARE_PROVIDER_SITE_OTHER): Payer: Medicaid Other

## 2020-03-30 VITALS — BP 104/70 | HR 80 | Temp 97.9°F | Ht 67.0 in | Wt 127.4 lb

## 2020-03-30 DIAGNOSIS — R7989 Other specified abnormal findings of blood chemistry: Secondary | ICD-10-CM

## 2020-03-30 DIAGNOSIS — R945 Abnormal results of liver function studies: Secondary | ICD-10-CM | POA: Diagnosis not present

## 2020-03-30 DIAGNOSIS — R112 Nausea with vomiting, unspecified: Secondary | ICD-10-CM

## 2020-03-30 DIAGNOSIS — R1011 Right upper quadrant pain: Secondary | ICD-10-CM

## 2020-03-30 LAB — LIPASE: Lipase: 10 U/L — ABNORMAL LOW (ref 11.0–59.0)

## 2020-03-30 LAB — HEPATIC FUNCTION PANEL
ALT: 108 U/L — ABNORMAL HIGH (ref 0–35)
AST: 27 U/L (ref 0–37)
Albumin: 4.8 g/dL (ref 3.5–5.2)
Alkaline Phosphatase: 59 U/L (ref 39–117)
Bilirubin, Direct: 0.5 mg/dL — ABNORMAL HIGH (ref 0.0–0.3)
Total Bilirubin: 1.8 mg/dL — ABNORMAL HIGH (ref 0.2–1.2)
Total Protein: 7.5 g/dL (ref 6.0–8.3)

## 2020-03-30 NOTE — Patient Instructions (Addendum)
If you are age 20 or older, your body mass index should be between 23-30. Your Body mass index is 19.95 kg/m. If this is out of the aforementioned range listed, please consider follow up with your Primary Care Provider.  If you are age 54 or younger, your body mass index should be between 19-25. Your Body mass index is 19.95 kg/m. If this is out of the aformentioned range listed, please consider follow up with your Primary Care Provider.   Your provider has requested that you go to the basement level for lab work before leaving today. Press "B" on the elevator. The lab is located at the first door on the left as you exit the elevator.  Keep your appointment with surgeon.  Follow up with me on 04/20/20 at 11:30 a.m.  Thank you for choosing me and [facility].   Willette Cluster, NP

## 2020-03-30 NOTE — Progress Notes (Signed)
IMPRESSION and PLAN:    18 yo female with pmh significant for cyclic vomiting syndrome, migraine headaches  # RUQ / abnormal LFTs / gallbladder sludge --Seen several days ago in ED for nausea / vomiting. I didn't see where the RUQ pain was mentioned but patient says it has been present for 2 weeks.  --AST 188 / ALT 305. Tbili 5.0, normal alk phos --Normal caliber bile ducts on [name]. With normal alk phos, choledocholithiasis seems unlikely.  --No suggestion of cholecystitis on [name] --No culprit medications --Repeat liver tests today. Additionally, obtain fractionated bilirubin, lipase, acute hepatitis panel, ANA.  --I will call her with results. Consider MCRP  --She is scheduled to see a Surgeon next week    # Chronic nausea / vomiting.  --Hx of abdominal migraines / cyclic vomiting syndrome. Currently off Nortriptyline due to pregnancy --Currently she is [redacted] weeks gestation which could be contributing to recent flare in nausea / vomiting  Also, could be related to above --ED prescribed anti-emetics    HPI:    Primary GI: Dr. Havery Moros  Chief complaint :  Abdominal pain, nausea, vomiting  Kathleen Grimes established care here 1 year ago via telemedicine visit for evaluation of nausea, vomiting and abdominal pain.  At that time she reported a history of abdominal migraines and cyclic vomiting syndrome. Please refer to that office dictation but briefly we recommended discontinuation of THC. She was given a trial of a PPI and Nortryptylline. H.pyori and celiac labs negative.   Kathleen Grimes went to [facility] ED several months later for vomiting. Per records she apparently left prior to being evaluated.   Patient seen in [facility] 03/24/20 with 3 days of vomiting. She was felt to be dehydrated and was hypokalemic with K+ of 3. CBC was normal. AST was 188 / ALT 305. Tbili 5.0, normal alk phos of 61. Lipase not drawn. [name] abd showed no acute findings. There was gallbladder sludge / polyps. CBD only 4 mm. No  findings of cholecystitis. She had taken two doses of Tylenol within a couple of days of ED labs but no other medications. Except for mildly elevated ALT of 52 in 2018, liver tests historically normal.    Patient was discharged from ED with anti-emetics, with potassium supplement and Keflex for UTI. Urine cx has returned and there was no significant growth. She is scheduled to see a surgeon with [facility] next week. Also referred to GI.   Kathleen Grimes reports RUQ pain radiating through to her back for the last 2 weeks.  I did not find mention of this at her recent ED visit for nausea / vomiting.  The pain is intermittent, worse with eating.  Not having the pain today but yesterday was a bad day.  The RUQ pain is different than pain we saw her for before which was in lower abdomen. Patient told gallbladder problems are common in pregnancy. She continues to have frequent nausea and vomiting.  Since being pregnant she has stopped all of her medications except for Keflex prescribed by ED for UTI.  She is not taking her prenatal vitamin or any OTC medications.    Review of systems:     No chest pain, no SOB, no fevers, no urinary sx   Past Medical History:  Diagnosis Date  . Abdominal migraine   . Allergic rhinitis 03/13/2013  . Allergy   . Anemia     Patient's surgical history, family medical history, social history,  medications and allergies were all reviewed in Epic   Serum creatinine: 0.49 mg/dL 03/24/20 1207 Estimated creatinine clearance: 102.4 mL/min  Current Outpatient Medications  Medication Sig Dispense Refill  . cephALEXin (KEFLEX) 500 MG capsule Take 1 capsule (500 mg total) by mouth 2 (two) times daily for 7 days. 14 capsule 0  . dicyclomine (BENTYL) 10 MG capsule Take 1 capsule (10 mg total) by mouth every 8 (eight) hours as needed for spasms. (Patient not taking: Reported on 03/24/2020) 60 capsule 2  . Doxylamine-Pyridoxine (DICLEGIS) 10-10 MG TBEC Take 1 tablet by mouth at bedtime.    .  nortriptyline (PAMELOR) 10 MG capsule Take 1 capsule (10 mg total) by mouth at bedtime for 14 days. Then increase to 2 tablets ('20mg'$ ) by mouth at bedtime (Patient not taking: Reported on 03/24/2020) 60 capsule 1  . omeprazole (PRILOSEC) 40 MG capsule Take 1 capsule (40 mg total) by mouth daily. (Patient not taking: Reported on 03/24/2020) 30 capsule 3  . ondansetron (ZOFRAN-ODT) 4 MG disintegrating tablet Take 1 tablet (4 mg total) by mouth every 8 (eight) hours as needed for nausea or vomiting. (Patient not taking: Reported on 03/24/2020) 90 tablet 1  . potassium chloride SA (KLOR-CON) 20 MEQ tablet Take 2 tablets (40 mEq total) by mouth daily for 5 days. 10 tablet 0  . prenatal vitamin w/FE, FA (PRENATAL 1 + 1) 27-1 MG TABS tablet Take 1 tablet by mouth daily at 12 noon.    . promethazine (PHENERGAN) 25 MG tablet Take 25 mg by mouth every 6 (six) hours as needed for nausea or vomiting.     No current facility-administered medications for this visit.    Physical Exam:     BP 104/70   Pulse 80   Temp 97.9 F (36.6 C)   Ht '5\' 7"'$  (1.702 m)   Wt 127 lb 6 oz (57.8 kg)   LMP  (LMP Unknown)   BMI 19.95 kg/m   GENERAL:  Pleasant female in NAD PSYCH: : Cooperative, normal affect CARDIAC:  RRR, no murmur heard, no peripheral edema PULM: Normal respiratory effort, lungs CTA bilaterally, no wheezing ABDOMEN:  Nondistended, soft, nontender. No obvious masses, no hepatomegaly,  normal bowel sounds SKIN:  turgor, no lesions seen Musculoskeletal:  Normal muscle tone, normal strength NEURO: Alert and oriented x 3, no focal neurologic deficits   Kathleen Grimes , NP 03/30/2020, 12:35 PM

## 2020-03-31 NOTE — Progress Notes (Signed)
Agree with assessment with the following thoughts: RUQ pain with sludge noted in the GB and transient elevation of LAEs. Biliary colic possible, she is seeing a surgeon however she is pregnant, hyperemesis possible with her nausea / vomiting on top of her chronic symptoms.   Not sure if she is still using marijuana but she needs to completely stop. Would give antiemetics as needed if those symptoms persist. Repeat LAEs show normalization of AST and downtrend of ALT to around 100. Awaiting acute hep panel. Would repeat LAEs in a few weeks to see if normalization occurs, and if persistent will need further serologic evaluation.

## 2020-04-01 LAB — HEPATITIS PANEL, ACUTE
Hep A IgM: NONREACTIVE
Hep B C IgM: NONREACTIVE
Hepatitis B Surface Ag: NONREACTIVE
Hepatitis C Ab: NONREACTIVE
SIGNAL TO CUT-OFF: 0.01 (ref <1.00)

## 2020-04-01 LAB — ANA: Anti Nuclear Antibody (ANA): NEGATIVE

## 2020-04-05 ENCOUNTER — Encounter: Payer: Self-pay | Admitting: General Surgery

## 2020-04-05 ENCOUNTER — Other Ambulatory Visit: Payer: Self-pay

## 2020-04-05 ENCOUNTER — Ambulatory Visit (INDEPENDENT_AMBULATORY_CARE_PROVIDER_SITE_OTHER): Payer: Medicaid Other | Admitting: General Surgery

## 2020-04-05 VITALS — BP 103/67 | HR 68 | Temp 97.9°F | Resp 12 | Ht 67.0 in | Wt 134.0 lb

## 2020-04-05 DIAGNOSIS — K8020 Calculus of gallbladder without cholecystitis without obstruction: Secondary | ICD-10-CM

## 2020-04-05 NOTE — Progress Notes (Signed)
[facility] History and Physical  Reason for Referral: Symptomatic cholelithiasis  Referring Physician: Muse, Rochelle D., PA-C     Chief Complaint     New Patient (Initial Visit)      Kathleen Grimes is a 19 y.o. female.  HPI: Ms. Kathleen Grimes is a very sweet 19 year who comes in with a chronic history of abdominal migraines and cyclic nausea /vomiting who is seen by [facility]. She recently started having RUQ pain and worsening symptoms. She is currently 9 weeks, 2 days pregnant with her first child. She says that the pain for about 2 weeks prior to seeing GI. She had been seen in the ED at [facility] and found to have elevated LFTs and US with no biliary dilation or stone. She was found to have gallbladder sludge and possible polyp. The ED visit does not really describe any RUQ pain but the patient reports that this was going on in addition to a worsening of her chronic nausea and vomiting.   Her LFTs were repeated with down trending bilirubin after her ED visit. T bilirubin of 5 trending down to 1.8. She says over the last week she has actually felt better. Her pain has been improved and the additional nausea/vomiting she felt on top of her chronic seems better.  She is nervous about having surgery and her baby. She is tearful at times.      Past Medical History:  Diagnosis Date  . Abdominal migraine   . Allergic rhinitis 03/13/2013  . Allergy   . Anemia         Past Surgical History:  Procedure Laterality Date  . WISDOM TOOTH EXTRACTION          Family History  Problem Relation Age of Onset  . Breast cancer Paternal Grandmother   . Cancer Maternal Great-grandmother    bone marrow  . Colon cancer Neg Hx   . Esophageal cancer Neg Hx   . Stomach cancer Neg Hx    Social History       Tobacco Use  . Smoking status: Never Smoker  . Smokeless tobacco: Never Used  Substance Use Topics  . Alcohol use: No  . Drug use: No   Medications: I have reviewed the patient's  current medications.  Allergies as of 04/05/2020   No Known Allergies          Medication List       Accurate as of April 05, 2020 11:59 PM. If you have any questions, ask your nurse or doctor.        STOP taking these medications    Diclegis 10-10 MG Tbec  Generic drug: Doxylamine-Pyridoxine  Stopped by: Neyland Pettengill C Brees Hounshell, MD   dicyclomine 10 MG capsule  Commonly known as: BENTYL  Stopped by: Curby Carswell C Yamili Lichtenwalner, MD   omeprazole 40 MG capsule  Commonly known as: PRILOSEC  Stopped by: Selby Slovacek C Astoria Condon, MD   ondansetron 4 MG disintegrating tablet  Commonly known as: ZOFRAN-ODT  Stopped by: Itzamara Casas C Zayvier Caravello, MD   potassium chloride SA 20 MEQ tablet  Commonly known as: KLOR-CON  Stopped by: Jehiel Koepp C Ronel Rodeheaver, MD   prenatal vitamin w/FE, FA 27-1 MG Tabs tablet  Stopped by: Khristian Phillippi C Julissa Browning, MD   promethazine 25 MG tablet  Commonly known as: PHENERGAN  Stopped by: Richey Doolittle C Verlisa Vara, MD       TAKE these medications    prenatal multivitamin Tabs tablet  Take 2 tablets by mouth daily at 12   noon. Gummies       ROS:  A comprehensive review of systems was negative except for: Gastrointestinal: positive for abdominal pain, nausea and vomiting  Musculoskeletal: positive for back pain  No vaginal bleeding or spotting, no lower abdominal pain  Blood pressure 103/67, pulse 68, temperature 97.9 F (36.6 C), temperature source Oral, resp. rate 12, height 5' 7" (1.702 m), weight 134 lb (60.8 kg), last menstrual period 02/07/2020, SpO2 99 %.  Physical Exam  Vitals reviewed.  Constitutional:  Appearance: She is normal weight.  HENT:  Head: Normocephalic.  Nose: Nose normal.  Mouth/Throat:  Mouth: Mucous membranes are moist.  Eyes:  Extraocular Movements: Extraocular movements intact.  Pupils: Pupils are equal, round, and reactive to light.  Abdominal:  General: There is no distension.  Palpations: Abdomen is soft.  Tenderness: There is no abdominal tenderness.    Comments: Unable to palpate uterus  Neurological:  Mental Status: She is alert.   Results:  US 03/24/2020- personally reviewed- normal wall thickness, some layering sludge, Small stone versus polyp  CLINICAL DATA: Elevated liver function tests.  EXAM:  ABDOMEN ULTRASOUND COMPLETE  COMPARISON: 08/03/2014  FINDINGS:  Gallbladder: No shadowing gallstones. Low level echoes noted within  the gallbladder lumen consistent with sludge. Small dependent  hypoechoic lesions in the lower gallbladder/gallbladder neck may  reflect polyps focal areas of tumefactive sludge. No wall  thickening. No pericholecystic fluid.  Common bile duct: Diameter: 4 mm  Liver: No focal lesion identified. Within normal limits in  parenchymal echogenicity. Portal vein is patent on color Doppler  imaging with normal direction of blood flow towards the liver.  IVC: No abnormality visualized.  Pancreas: Visualized portion unremarkable.  Spleen: Size and appearance within normal limits.  Right Kidney: Length: 10.7. Echogenicity within normal limits. No  mass or hydronephrosis visualized.  Left Kidney: Length: 11.0. Echogenicity within normal limits. No  mass or hydronephrosis visualized.  Abdominal aorta: No aneurysm visualized.  Other findings: None.  IMPRESSION:  1. No acute findings.  2. Gallbladder sludge with small polyps versus tumefactive sludge,  but no shadowing stones.  3. No other abnormalities. Normal sonographic appearance of the  liver.  Electronically Signed  By: David Ormond M.D.  On: 03/24/2020 15:03  Results for Sampley, Lisbeth M (MRN [id number]) as of 04/06/2020 12:31   Ref. Range 03/24/2020 12:07 03/30/2020 11:33  Alkaline Phosphatase Latest Ref Range: 39 - 117 U/L 61 59  Albumin Latest Ref Range: 3.5 - 5.2 g/dL 4.9 4.8  Lipase Latest Ref Range: 11.0 - 59.0 U/L  10.0 (L)  AST Latest Ref Range: 0 - 37 U/L 188 (H) 27  ALT Latest Ref Range: 0 - 35 U/L 305 (H) 108 (H)  Total Protein Latest Ref Range:  6.0 - 8.3 g/dL 8.0 7.5  Bilirubin, Direct Latest Ref Range: 0.0 - 0.3 mg/dL  0.5 (H)  Total Bilirubin Latest Ref Range: 0.2 - 1.2 mg/dL 5.0 (H) 1.8 (H)  GFR, Est Non African American Latest Ref Range: >60 mL/min >60    Assessment & Plan:  Dametra M Swantek is a 19 y.o. female with symptomatic cholelithiasis and potentially has had some choledocholithiasis given the elevated bilirubin in the ED. Her CBD is normal caliber and her pain has resolved so likely she passed a stone. She is feeling better. She will be 10 weeks, 2 days pregnant on 04/15/2020. She is at risk for further choledocholithiasis, acute cholecystitis, pancreatitis.  We discussed surgery during pregnancy and the guidelines that indicate surgery and laparoscopic   surgery is safe during all trimesters of pregnancy. We discussed that in the first trimester there is the risk of miscarriage can be as high as 10%. We discussed that research indicates that first, second, and third trimester surgeries are safe and have a low risk of miscarriage but that a miscarriage can still occur. We discussed that in the event of worsening symptoms, infection, or further disease that these such as appendicitis, cholecystitis could actually be worse for the fetus. We discussed the need for anesthesia and that the medications are felt to be safe in all stages of pregnancy without a high risk of teratogenic effect.  PLAN: I counseled the patient about the indication, risks and benefits of laparoscopic cholecystectomy. She understands there is a very small chance for bleeding, infection, injury to normal structures (including common bile duct), conversion to open surgery, persistent symptoms, evolution of postcholecystectomy diarrhea, need for secondary interventions, anesthesia reaction, cardiopulmonary issues and other risks not specifically detailed here. I described the expected recovery, the plan for follow-up and the restrictions during the recovery phase. We  discussed the potential need for intraoperative cholangiogram and the risk of allergic reaction from the dye and the radiation use which is low and acceptable and the lower abdomen would be shielded from the radiation if this were needed. All questions were answered.  Plan for laparoscopic cholecystectomy with intraoperative cholangiogram. Discussed need for COVID testing.  She wants to return to work week at [employer] and plan to be out of work for a week following surgery.  I will reach out to Dr. Ferguson to discuss the case. I will call the patient's mother this week to also answer any of her questions.  All questions were answered to the satisfaction of the patient.   Lillah Standre C Daleah Coulson  04/06/2020, 12:35 PM   

## 2020-04-05 NOTE — Progress Notes (Signed)
[facility]  Patient has been out of work due to illness and can return to work 04/05/2020. She will be out of work for surgery starting 04/13/2020 until 04/22/2020.  Algis Greenhouse, MD [facility] [address] [place], [place] [address] [phone] (office)

## 2020-04-05 NOTE — Patient Instructions (Signed)
Cholelithiasis  Cholelithiasis is also called "gallstones." It is a kind of gallbladder disease. The gallbladder is an organ that stores a liquid (bile) that helps you digest fat. Gallstones may not cause symptoms (may be silent gallstones) until they cause a blockage, and then they can cause pain (gallbladder attack). Follow these instructions at home:  Take over-the-counter and prescription medicines only as told by your doctor.  Stay at a healthy weight.  Eat healthy foods. This includes: ? Eating fewer fatty foods, like fried foods. ? Eating fewer refined carbs (refined carbohydrates). Refined carbs are breads and grains that are highly processed, like white bread and white rice. Instead, choose whole grains like whole-wheat bread and brown rice. ? Eating more fiber. Almonds, fresh fruit, and beans are healthy sources of fiber.  Keep all follow-up visits as told by your doctor. This is important. Contact a doctor if:  You have sudden pain in the upper right side of your belly (abdomen). Pain might spread to your right shoulder or your chest. This may be a sign of a gallbladder attack.  You feel sick to your stomach (are nauseous).  You throw up (vomit).  You have been diagnosed with gallstones that have no symptoms and you get: ? Belly pain. ? Discomfort, burning, or fullness in the upper part of your belly (indigestion). Get help right away if:  You have sudden pain in the upper right side of your belly, and it lasts for more than 2 hours.  You have belly pain that lasts for more than 5 hours.  You have a fever or chills.  You keep feeling sick to your stomach or you keep throwing up.  Your skin or the whites of your eyes turn yellow (jaundice).  You have dark-colored pee (urine).  You have light-colored poop (stool). Summary  Cholelithiasis is also called "gallstones."  The gallbladder is an organ that stores a liquid (bile) that helps you digest fat.  Silent  gallstones are gallstones that do not cause symptoms.  A gallbladder attack may cause sudden pain in the upper right side of your belly. Pain might spread to your right shoulder or your chest. If this happens, contact your doctor.  If you have sudden pain in the upper right side of your belly that lasts for more than 2 hours, get help right away. This information is not intended to replace advice given to you by your health care provider. Make sure you discuss any questions you have with your health care provider. Document Revised: 11/15/2017 Document Reviewed: 08/19/2016 Elsevier Patient Education  2020 Elsevier Inc.    Laparoscopic Cholecystectomy Laparoscopic cholecystectomy is surgery to remove the gallbladder. The gallbladder is a pear-shaped organ that lies beneath the liver on the right side of the body. The gallbladder stores bile, which is a fluid that helps the body to digest fats. Cholecystectomy is often done for inflammation of the gallbladder (cholecystitis). This condition is usually caused by a buildup of gallstones (cholelithiasis) in the gallbladder. Gallstones can block the flow of bile, which can result in inflammation and pain. In severe cases, emergency surgery may be required. This procedure is done though small incisions in your abdomen (laparoscopic surgery). A thin scope with a camera (laparoscope) is inserted through one incision. Thin surgical instruments are inserted through the other incisions. In some cases, a laparoscopic procedure may be turned into a type of surgery that is done through a larger incision (open surgery). Tell a health care provider about:  Any   allergies you have.  All medicines you are taking, including vitamins, herbs, eye drops, creams, and over-the-counter medicines.  Any problems you or family members have had with anesthetic medicines.  Any blood disorders you have.  Any surgeries you have had.  Any medical conditions you  have.  Whether you are pregnant or may be pregnant. What are the risks? Generally, this is a safe procedure. However, problems may occur, including:  Infection.  Bleeding.  Allergic reactions to medicines.  Damage to other structures or organs.  A stone remaining in the common bile duct. The common bile duct carries bile from the gallbladder into the small intestine.  A bile leak from the cyst duct that is clipped when your gallbladder is removed. Medicines  Ask your health care provider about: ? Changing or stopping your regular medicines. This is especially important if you are taking diabetes medicines or blood thinners. ? Taking medicines such as aspirin and ibuprofen. These medicines can thin your blood. Do not take these medicines before your procedure if your health care provider instructs you not to.  You may be given antibiotic medicine to help prevent infection. General instructions  Let your health care provider know if you develop a cold or an infection before surgery.  Plan to have someone take you home from the hospital or clinic.  Ask your health care provider how your surgical site will be marked or identified. What happens during the procedure?   To reduce your risk of infection: ? Your health care team will wash or sanitize their hands. ? Your skin will be washed with soap. ? Hair may be removed from the surgical area.  An IV tube may be inserted into one of your veins.  You will be given one or more of the following: ? A medicine to help you relax (sedative). ? A medicine to make you fall asleep (general anesthetic).  A breathing tube will be placed in your mouth.  Your surgeon will make several small cuts (incisions) in your abdomen.  The laparoscope will be inserted through one of the small incisions. The camera on the laparoscope will send images to a TV screen (monitor) in the operating room. This lets your surgeon see inside your  abdomen.  Air-like gas will be pumped into your abdomen. This will expand your abdomen to give the surgeon more room to perform the surgery.  Other tools that are needed for the procedure will be inserted through the other incisions. The gallbladder will be removed through one of the incisions.  Your common bile duct may be examined. If stones are found in the common bile duct, they may be removed.  After your gallbladder has been removed, the incisions will be closed with stitches (sutures), staples, or skin glue.  Your incisions may be covered with a bandage (dressing). The procedure may vary among health care providers and hospitals. What happens after the procedure?  Your blood pressure, heart rate, breathing rate, and blood oxygen level will be monitored until the medicines you were given have worn off.  You will be given medicines as needed to control your pain.  Do not drive for 24 hours if you were given a sedative. This information is not intended to replace advice given to you by your health care provider. Make sure you discuss any questions you have with your health care provider. Document Revised: 11/15/2017 Document Reviewed: 05/21/2016 Elsevier Patient Education  2020 Elsevier Inc.  

## 2020-04-06 ENCOUNTER — Encounter (HOSPITAL_COMMUNITY): Payer: Self-pay

## 2020-04-06 ENCOUNTER — Other Ambulatory Visit: Payer: Self-pay

## 2020-04-06 ENCOUNTER — Emergency Department (HOSPITAL_COMMUNITY)
Admission: EM | Admit: 2020-04-06 | Discharge: 2020-04-06 | Disposition: A | Discharge status: Home / Self Care | Payer: Medicaid Other | Attending: Emergency Medicine | Admitting: Emergency Medicine

## 2020-04-06 ENCOUNTER — Emergency Department (HOSPITAL_COMMUNITY): Payer: Medicaid Other

## 2020-04-06 DIAGNOSIS — Z3A09 9 weeks gestation of pregnancy: Secondary | ICD-10-CM | POA: Diagnosis not present

## 2020-04-06 DIAGNOSIS — R1011 Right upper quadrant pain: Secondary | ICD-10-CM | POA: Diagnosis not present

## 2020-04-06 DIAGNOSIS — O26899 Other specified pregnancy related conditions, unspecified trimester: Secondary | ICD-10-CM | POA: Diagnosis not present

## 2020-04-06 DIAGNOSIS — R112 Nausea with vomiting, unspecified: Secondary | ICD-10-CM | POA: Diagnosis not present

## 2020-04-06 DIAGNOSIS — K8020 Calculus of gallbladder without cholecystitis without obstruction: Secondary | ICD-10-CM | POA: Insufficient documentation

## 2020-04-06 DIAGNOSIS — O219 Vomiting of pregnancy, unspecified: Secondary | ICD-10-CM

## 2020-04-06 LAB — CBC WITH DIFFERENTIAL/PLATELET
Abs Immature Granulocytes: 0.05 10*3/uL (ref 0.00–0.07)
Basophils Absolute: 0 10*3/uL (ref 0.0–0.1)
Basophils Relative: 0 %
Eosinophils Absolute: 0 10*3/uL (ref 0.0–0.5)
Eosinophils Relative: 0 %
HCT: 38.1 % (ref 36.0–46.0)
Hemoglobin: 13.4 g/dL (ref 12.0–15.0)
Immature Granulocytes: 1 %
Lymphocytes Relative: 6 %
Lymphs Abs: 0.4 10*3/uL — ABNORMAL LOW (ref 0.7–4.0)
MCH: 32.4 pg (ref 26.0–34.0)
MCHC: 35.2 g/dL (ref 30.0–36.0)
MCV: 92.3 fL (ref 80.0–100.0)
Monocytes Absolute: 0.2 10*3/uL (ref 0.1–1.0)
Monocytes Relative: 3 %
Neutro Abs: 6.8 10*3/uL (ref 1.7–7.7)
Neutrophils Relative %: 90 %
Platelets: 246 10*3/uL (ref 150–400)
RBC: 4.13 MIL/uL (ref 3.87–5.11)
RDW: 12.6 % (ref 11.5–15.5)
WBC: 7.5 10*3/uL (ref 4.0–10.5)
nRBC: 0 % (ref 0.0–0.2)

## 2020-04-06 LAB — COMPREHENSIVE METABOLIC PANEL
ALT: 49 U/L — ABNORMAL HIGH (ref 0–44)
AST: 24 U/L (ref 15–41)
Albumin: 4.3 g/dL (ref 3.5–5.0)
Alkaline Phosphatase: 45 U/L (ref 38–126)
Anion gap: 9 (ref 5–15)
BUN: <5 mg/dL — ABNORMAL LOW (ref 6–20)
CO2: 21 mmol/L — ABNORMAL LOW (ref 22–32)
Calcium: 9.1 mg/dL (ref 8.9–10.3)
Chloride: 102 mmol/L (ref 98–111)
Creatinine, Ser: 0.44 mg/dL (ref 0.44–1.00)
GFR calc Af Amer: >60 mL/min (ref >60)
GFR calc non Af Amer: >60 mL/min (ref >60)
Glucose, Bld: 105 mg/dL — ABNORMAL HIGH (ref 70–99)
Potassium: 3.1 mmol/L — ABNORMAL LOW (ref 3.5–5.1)
Sodium: 132 mmol/L — ABNORMAL LOW (ref 135–145)
Total Bilirubin: 0.9 mg/dL (ref 0.3–1.2)
Total Protein: 7.3 g/dL (ref 6.5–8.1)

## 2020-04-06 LAB — URINALYSIS, ROUTINE W REFLEX MICROSCOPIC
Bilirubin Urine: NEGATIVE
Glucose, UA: NEGATIVE mg/dL
Hgb urine dipstick: NEGATIVE
Ketones, ur: 80 mg/dL — AB
Leukocytes,Ua: NEGATIVE
Nitrite: NEGATIVE
Protein, ur: NEGATIVE mg/dL
Specific Gravity, Urine: 1.01 (ref 1.005–1.030)
pH: 9 — ABNORMAL HIGH (ref 5.0–8.0)

## 2020-04-06 LAB — LIPASE, BLOOD: Lipase: 17 U/L (ref 11–51)

## 2020-04-06 MED ORDER — METOCLOPRAMIDE HCL 5 MG/ML IJ SOLN
10.0000 mg | Freq: Once | INTRAMUSCULAR | Status: AC
  Administered 2020-04-06: 17:00:00 10 mg via INTRAVENOUS
  Filled 2020-04-06: qty 2

## 2020-04-06 MED ORDER — MORPHINE SULFATE (PF) 4 MG/ML IV SOLN
4.0000 mg | Freq: Once | INTRAVENOUS | Status: AC
  Administered 2020-04-06: 4 mg via INTRAVENOUS
  Filled 2020-04-06: qty 1

## 2020-04-06 MED ORDER — ONDANSETRON HCL 4 MG/2ML IJ SOLN
4.0000 mg | Freq: Once | INTRAMUSCULAR | Status: AC
  Administered 2020-04-06: 14:00:00 4 mg via INTRAVENOUS
  Filled 2020-04-06: qty 2

## 2020-04-06 MED ORDER — POTASSIUM CHLORIDE CRYS ER 20 MEQ PO TBCR
40.0000 meq | EXTENDED_RELEASE_TABLET | Freq: Once | ORAL | Status: AC
  Administered 2020-04-06: 17:00:00 40 meq via ORAL
  Filled 2020-04-06: qty 2

## 2020-04-06 MED ORDER — SODIUM CHLORIDE 0.9 % IV BOLUS
1000.0000 mL | Freq: Once | INTRAVENOUS | Status: AC
  Administered 2020-04-06: 14:00:00 1000 mL via INTRAVENOUS

## 2020-04-06 MED ORDER — METOCLOPRAMIDE HCL 10 MG PO TABS: 10.0000 mg | ORAL_TABLET | Freq: Four times a day (QID) | ORAL | 0 refills | Status: DC

## 2020-04-06 NOTE — ED Triage Notes (Signed)
Pt presents to ED with complaints of vomiting and cold chills. Pt is currently [redacted] weeks pregnant. Pt is also having right sided abd pain.

## 2020-04-06 NOTE — ED Provider Notes (Signed)
[facility] EMERGENCY DEPARTMENT Provider Note   CSN: [id number] Arrival date & time: 04/06/20  1246     History Chief Complaint  Patient presents with  . Emesis    Kathleen Grimes is a 19 y.o. female with a past medical history significant for anemia, seasonal allergies, and calculus of gallbladder who presents to the ED due to nausea and vomiting that started earlier this morning associated with RUQ abdominal pain.  Patient is currently 9 weeks and 3 days pregnant with her first pregnancy.  She was seen by Dr. Constance Haw with general surgery yesterday and has a scheduled laparoscopic cholecystectomy on 04/15/2020.  Patient admits to worsening right upper quadrant abdominal pain associated with yellow/greenish emesis.  She admits to 5 episodes of non-bloody emesis today.  Denies diarrhea.  Denies sick contacts and Covid exposures.  Denies vaginal bleeding, urinary symptoms, and vaginal fluid.  Chart reviewed.  Patient had a transvaginal ultrasound performed on 4/5 that confirmed a viable IUP.  [facility].  She has not tried anything for her symptoms.  Denies fever, but admits to chills.  History obtained from patient and past medical records. No interpreter used during encounter.      Past Medical History:  Diagnosis Date  . Abdominal migraine   . Allergic rhinitis 03/13/2013  . Allergy   . Anemia     Patient Active Problem List   Diagnosis Date Noted  . Calculus of gallbladder without cholecystitis without obstruction 04/06/2020  . Abdominal migraine 02/22/2015  . Cyclical vomiting 17/51/0258  . Allergic rhinitis 03/13/2013    Past Surgical History:  Procedure Laterality Date  . WISDOM TOOTH EXTRACTION       OB History    Gravida  1   Para      Term      Preterm      AB      Living        SAB      TAB      Ectopic      Multiple      Live Births              Family History  Problem Relation Age of Onset  . Breast cancer  Paternal Grandmother   . Cancer Maternal Great-grandmother        bone marrow  . Colon cancer Neg Hx   . Esophageal cancer Neg Hx   . Stomach cancer Neg Hx     Social History   Tobacco Use  . Smoking status: Never Smoker  . Smokeless tobacco: Never Used  Substance Use Topics  . Alcohol use: No  . Drug use: No    Home Medications Prior to Admission medications   Medication Sig Start Date End Date Taking? Authorizing Provider  metoCLOPramide (REGLAN) 10 MG tablet Take 1 tablet (10 mg total) by mouth every 6 (six) hours. 04/06/20   Suzy Bouchard, PA-C  Prenatal Vit-Fe Fumarate-FA (PRENATAL MULTIVITAMIN) TABS tablet Take 2 tablets by mouth daily at 12 noon. Gummies    [provider]    Allergies    Patient has no known allergies.  Review of Systems   Review of Systems  Constitutional: Positive for chills. Negative for fever.  Respiratory: Negative for shortness of breath.   Cardiovascular: Negative for chest pain.  Gastrointestinal: Positive for abdominal pain (RUQ), nausea and vomiting. Negative for diarrhea.  Genitourinary: Negative for dysuria, vaginal bleeding and vaginal discharge.  All other systems reviewed  and are negative.   Physical Exam Updated Vital Signs BP (!) 116/97 (BP Location: Right Arm)   Pulse 95   Temp 98.8 F (37.1 C) (Oral)   Resp 18   Ht 5\' 7"  (1.702 m)   Wt 60.8 kg   LMP 02/07/2020   SpO2 100%   BMI 20.99 kg/m   Physical Exam Vitals and nursing note reviewed.  Constitutional:      General: She is not in acute distress.    Appearance: She is not toxic-appearing.     Comments: Appears to be uncomfortable in bed  HENT:     Head: Normocephalic.  Eyes:     Pupils: Pupils are equal, round, and reactive to light.  Cardiovascular:     Rate and Rhythm: Normal rate and regular rhythm.     Pulses: Normal pulses.     Heart sounds: Normal heart sounds. No murmur. No friction rub. No gallop.   Pulmonary:     Effort: Pulmonary  effort is normal.     Breath sounds: Normal breath sounds.  Abdominal:     General: Abdomen is flat. Bowel sounds are normal. There is no distension.     Palpations: Abdomen is soft.     Tenderness: There is abdominal tenderness. There is no guarding or rebound.     Comments: Tenderness palpation right upper quadrant without guarding or rebound.  Musculoskeletal:     Cervical back: Neck supple.     Comments: Able to move all 4 extremities without difficulty.  Skin:    General: Skin is warm and dry.  Neurological:     General: No focal deficit present.     Mental Status: She is alert.  Psychiatric:        Mood and Affect: Mood normal.        Behavior: Behavior normal.     ED Results / Procedures / Treatments   Labs (all labs ordered are listed, but only abnormal results are displayed) Labs Reviewed  CBC WITH DIFFERENTIAL/PLATELET - Abnormal; Notable for the following components:      Result Value   Lymphs Abs 0.4 (*)    All other components within normal limits  COMPREHENSIVE METABOLIC PANEL - Abnormal; Notable for the following components:   Sodium 132 (*)    Potassium 3.1 (*)    CO2 21 (*)    Glucose, Bld 105 (*)    BUN <5 (*)    ALT 49 (*)    All other components within normal limits  URINALYSIS, ROUTINE W REFLEX MICROSCOPIC - Abnormal; Notable for the following components:   APPearance CLOUDY (*)    pH 9.0 (*)    Ketones, ur 80 (*)    All other components within normal limits  LIPASE, BLOOD    EKG None  Radiology 02/09/2020 Abdomen Limited RUQ  Result Date: 04/06/2020 CLINICAL DATA:  Right upper quadrant pain EXAM: ULTRASOUND ABDOMEN LIMITED RIGHT UPPER QUADRANT COMPARISON:  03/24/2020 FINDINGS: Gallbladder: Small gallbladder polyp measuring 4 mm. No evidence of cholelithiasis or cholecystitis. Common bile duct: Diameter: 3 mm Liver: No focal lesion identified. Within normal limits in parenchymal echogenicity. Portal vein is patent on color Doppler imaging with normal  direction of blood flow towards the liver. Other: None. IMPRESSION: 1. Small gallbladder polyp. 2. Otherwise unremarkable exam. Electronically Signed   By: 05/24/2020 M.D.   On: 04/06/2020 16:19    Procedures Procedures (including critical care time)  Medications Ordered in ED Medications  ondansetron (ZOFRAN) injection 4 mg (  4 mg Intravenous Given 04/06/20 1424)  sodium chloride 0.9 % bolus 1,000 mL (0 mLs Intravenous Stopped 04/06/20 1607)  potassium chloride SA (KLOR-CON) CR tablet 40 mEq (40 mEq Oral Given 04/06/20 1652)  morphine 4 MG/ML injection 4 mg (4 mg Intravenous Given 04/06/20 1631)  metoCLOPramide (REGLAN) injection 10 mg (10 mg Intravenous Given 04/06/20 1630)    ED Course  I have reviewed the triage vital signs and the nursing notes.  Pertinent labs & imaging results that were available during my care of the patient were reviewed by me and considered in my medical decision making (see chart for details).    MDM Rules/Calculators/A&P                     19 year old female presents to the ED due to worsening right upper quadrant abdominal pain associate with nausea and vomiting that started today.  Patient has history of the same and is scheduled for a laparoscopic cholecystectomy on 04/15/2020 with Dr. Henreitta Leber with general surgery.  Patient is currently 9 weeks and 3 days pregnant with her first pregnancy.  Denies vaginal bleeding and vaginal fluid.  Upon arrival, patient is afebrile, not tachycardic or hypoxic.  Patient in no acute distress and nontoxic-appearing.  Physical exam significant for right upper quadrant tenderness palpation without rebound or guarding.  Will obtain routine labs to trend LFTs from previous ED visit.  Will also order [name] to rule out acute cholecystitis. Suspect symptoms related to nausea in pregnancy vs. Acute cholecystitis.  Will give IV fluids, Zofran, and morphine.  CMP significant for mild hyponatremia at 132 and hyper kalemia at 3.1.  Potassium  repleted here in the ED.  Very mild elevation in ALT at 49 which is an improvement from a week and 2 weeks ago.  CBC reassuring with no leukocytosis.  Lipase normal at 17.  Doubt pancreatitis.  UA negative for signs of infection.  Mild ketonuria likely due to dehydration from emesis.  [name] personally reviewed which demonstrates: IMPRESSION:  1. Small gallbladder polyp.  2. Otherwise unremarkable exam.     No concern for acute cholecystitis at this time.   5:25 PM reassessed patient at bedside who admits to improvement in symptoms.  She notes she is ready to go home.  Abdomen soft, nondistended with improvement in right upper quadrant abdominal pain.  No peritoneal signs.  Will discharge patient with nausea medication.  Advised patient to call OB/GYN tomorrow for further evaluation of nausea vomiting in early pregnancy.  Advised patient to call general surgery if right upper quadrant abdominal pain continues.  Instructed patient to take over-the-counter Tylenol as needed for pain.  Patient able to tolerate p.o. here in the ED. Strict ED precautions discussed with patient. Patient states understanding and agrees to plan. Patient discharged home in no acute distress and stable vitals.  Final Clinical Impression(s) / ED Diagnoses Final diagnoses:  RUQ pain  Nausea and vomiting in pregnancy  [redacted] weeks gestation of pregnancy    Rx / DC Orders ED Discharge Orders         Ordered    metoCLOPramide (REGLAN) 10 MG tablet  Every 6 hours,   Status:  Discontinued     04/06/20 1658    metoCLOPramide (REGLAN) 10 MG tablet  Every 6 hours     04/06/20 1726           Jesusita Oka 04/06/20 1729    Vanetta Mulders, MD 04/11/20 [phone]

## 2020-04-06 NOTE — Discharge Instructions (Addendum)
As discussed, all of your labs and ultrasound were reassuring.  No signs of gallstones or infection in your gallbladder.  I suspect your nausea and vomiting is related to early pregnancy.  I am sending you home with a nausea medication.  Please call your OB/GYN tomorrow to see if they have any other recommendations for nausea and early pregnancy.  If your right upper quadrant pain continues, please call the general surgeon for further evaluation.  Return to the ER for new or worsening symptoms.  If you continue to have abdominal pain, you may take over-the-counter Tylenol as needed for pain.  Do not take ibuprofen because it can be harmful in pregnancy.

## 2020-04-06 NOTE — Progress Notes (Signed)
Beth, please let her know that liver tests have improved. Please ask her to come for LFTs in 2 weeks. Make sure she isn't using marijuana. Thanks

## 2020-04-07 ENCOUNTER — Telehealth: Payer: Self-pay | Admitting: General Surgery

## 2020-04-07 NOTE — H&P (Signed)
[facility] History and Physical  Reason for Referral: Symptomatic cholelithiasis  Referring Physician: Tylene Fantasia., PA-C     Chief Complaint     New Patient (Initial Visit)      Kathleen Grimes is a 19 y.o. female.  HPI: Kathleen Grimes is a very sweet 19 year who comes in with a chronic history of abdominal migraines and cyclic nausea /vomiting who is seen by [facility]. She recently started having RUQ pain and worsening symptoms. She is currently 9 weeks, 2 days pregnant with her first child. She says that the pain for about 2 weeks prior to seeing GI. She had been seen in the ED at [facility] and found to have elevated LFTs and [name] with no biliary dilation or stone. She was found to have gallbladder sludge and possible polyp. The ED visit does not really describe any RUQ pain but the patient reports that this was going on in addition to a worsening of her chronic nausea and vomiting.   Her LFTs were repeated with down trending bilirubin after her ED visit. T bilirubin of 5 trending down to 1.8. She says over the last week she has actually felt better. Her pain has been improved and the additional nausea/vomiting she felt on top of her chronic seems better.  She is nervous about having surgery and her baby. She is tearful at times.      Past Medical History:  Diagnosis Date  . Abdominal migraine   . Allergic rhinitis 03/13/2013  . Allergy   . Anemia         Past Surgical History:  Procedure Laterality Date  . WISDOM TOOTH EXTRACTION          Family History  Problem Relation Age of Onset  . Breast cancer Paternal Grandmother   . Cancer Maternal Great-grandmother    bone marrow  . Colon cancer Neg Hx   . Esophageal cancer Neg Hx   . Stomach cancer Neg Hx    Social History       Tobacco Use  . Smoking status: Never Smoker  . Smokeless tobacco: Never Used  Substance Use Topics  . Alcohol use: No  . Drug use: No   Medications: I have reviewed the patient's  current medications.  Allergies as of 04/05/2020   No Known Allergies          Medication List       Accurate as of April 05, 2020 11:59 PM. If you have any questions, ask your nurse or doctor.        STOP taking these medications    Diclegis 10-10 MG Tbec  Generic drug: Doxylamine-Pyridoxine  Stopped by: Kathleen Roers, MD   dicyclomine 10 MG capsule  Commonly known as: BENTYL  Stopped by: Kathleen Roers, MD   omeprazole 40 MG capsule  Commonly known as: PRILOSEC  Stopped by: Kathleen Roers, MD   ondansetron 4 MG disintegrating tablet  Commonly known as: ZOFRAN-ODT  Stopped by: Kathleen Roers, MD   potassium chloride SA 20 MEQ tablet  Commonly known as: KLOR-CON  Stopped by: Kathleen Roers, MD   prenatal vitamin w/FE, FA 27-1 MG Tabs tablet  Stopped by: Kathleen Roers, MD   promethazine 25 MG tablet  Commonly known as: PHENERGAN  Stopped by: Kathleen Roers, MD       TAKE these medications    prenatal multivitamin Tabs tablet  Take 2 tablets by mouth daily at 12  noon. Gummies       ROS:  A comprehensive review of systems was negative except for: Gastrointestinal: positive for abdominal pain, nausea and vomiting  Musculoskeletal: positive for back pain  No vaginal bleeding or spotting, no lower abdominal pain  Blood pressure 103/67, pulse 68, temperature 97.9 F (36.6 C), temperature source Oral, resp. rate 12, height 5\' 7"  (1.702 m), weight 134 lb (60.8 kg), last menstrual period 02/07/2020, SpO2 99 %.  Physical Exam  Vitals reviewed.  Constitutional:  Appearance: She is normal weight.  HENT:  Head: Normocephalic.  Nose: Nose normal.  Mouth/Throat:  Mouth: Mucous membranes are moist.  Eyes:  Extraocular Movements: Extraocular movements intact.  Pupils: Pupils are equal, round, and reactive to light.  Abdominal:  General: There is no distension.  Palpations: Abdomen is soft.  Tenderness: There is no abdominal tenderness.    Comments: Unable to palpate uterus  Neurological:  Mental Status: She is alert.   Results:  [name] 03/24/2020- personally reviewed- normal wall thickness, some layering sludge, Small stone versus polyp  CLINICAL DATA: Elevated liver function tests.  EXAM:  ABDOMEN ULTRASOUND COMPLETE  COMPARISON: 08/03/2014  FINDINGS:  Gallbladder: No shadowing gallstones. Low level echoes noted within  the gallbladder lumen consistent with sludge. Small dependent  hypoechoic lesions in the lower gallbladder/gallbladder neck may  reflect polyps focal areas of tumefactive sludge. No wall  thickening. No pericholecystic fluid.  Common bile duct: Diameter: 4 mm  Liver: No focal lesion identified. Within normal limits in  parenchymal echogenicity. Portal vein is patent on color Doppler  imaging with normal direction of blood flow towards the liver.  IVC: No abnormality visualized.  Pancreas: Visualized portion unremarkable.  Spleen: Size and appearance within normal limits.  Right Kidney: Length: 10.7. Echogenicity within normal limits. No  mass or hydronephrosis visualized.  Left Kidney: Length: 11.0. Echogenicity within normal limits. No  mass or hydronephrosis visualized.  Abdominal aorta: No aneurysm visualized.  Other findings: None.  IMPRESSION:  1. No acute findings.  2. Gallbladder sludge with small polyps versus tumefactive sludge,  but no shadowing stones.  3. No other abnormalities. Normal sonographic appearance of the  liver.  Electronically Signed  By: Lajean Manes M.D.  On: 03/24/2020 15:03  Results for Kathleen Grimes, Kathleen Grimes (MRN [id number]) as of 04/06/2020 12:31   Ref. Range 03/24/2020 12:07 03/30/2020 11:33  Alkaline Phosphatase Latest Ref Range: 39 - 117 U/L 61 59  Albumin Latest Ref Range: 3.5 - 5.2 g/dL 4.9 4.8  Lipase Latest Ref Range: 11.0 - 59.0 U/L  10.0 (L)  AST Latest Ref Range: 0 - 37 U/L 188 (H) 27  ALT Latest Ref Range: 0 - 35 U/L 305 (H) 108 (H)  Total Protein Latest Ref Range:  6.0 - 8.3 g/dL 8.0 7.5  Bilirubin, Direct Latest Ref Range: 0.0 - 0.3 mg/dL  0.5 (H)  Total Bilirubin Latest Ref Range: 0.2 - 1.2 mg/dL 5.0 (H) 1.8 (H)  GFR, Est Non African American Latest Ref Range: >60 mL/min >60    Assessment & Plan:  ILAH BOULE is a 19 y.o. female with symptomatic cholelithiasis and potentially has had some choledocholithiasis given the elevated bilirubin in the ED. Her CBD is normal caliber and her pain has resolved so likely she passed a stone. She is feeling better. She will be 10 weeks, 2 days pregnant on 04/15/2020. She is at risk for further choledocholithiasis, acute cholecystitis, pancreatitis.  We discussed surgery during pregnancy and the guidelines that indicate surgery and laparoscopic  surgery is safe during all trimesters of pregnancy. We discussed that in the first trimester there is the risk of miscarriage can be as high as 10%. We discussed that research indicates that first, second, and third trimester surgeries are safe and have a low risk of miscarriage but that a miscarriage can still occur. We discussed that in the event of worsening symptoms, infection, or further disease that these such as appendicitis, cholecystitis could actually be worse for the fetus. We discussed the need for anesthesia and that the medications are felt to be safe in all stages of pregnancy without a high risk of teratogenic effect.  PLAN: I counseled the patient about the indication, risks and benefits of laparoscopic cholecystectomy. She understands there is a very small chance for bleeding, infection, injury to normal structures (including common bile duct), conversion to open surgery, persistent symptoms, evolution of postcholecystectomy diarrhea, need for secondary interventions, anesthesia reaction, cardiopulmonary issues and other risks not specifically detailed here. I described the expected recovery, the plan for follow-up and the restrictions during the recovery phase. We  discussed the potential need for intraoperative cholangiogram and the risk of allergic reaction from the dye and the radiation use which is low and acceptable and the lower abdomen would be shielded from the radiation if this were needed. All questions were answered.  Plan for laparoscopic cholecystectomy with intraoperative cholangiogram. Discussed need for COVID testing.  She wants to return to work week at [employer] and plan to be out of work for a week following surgery.  I will reach out to Dr. Emelda Fear to discuss the case. I will call the patient's mother this week to also answer any of her questions.  All questions were answered to the satisfaction of the patient.   Kathleen Grimes  04/06/2020, 12:35 PM

## 2020-04-07 NOTE — Telephone Encounter (Signed)
[facility]  Called patient's mother to see if she had any questions about the patient having surgery. Discussed that she is 10 weeks next week, and that I have reached out to Dr. Emelda Fear. Her first US showed possible stones and polyp and she has been back to the Ed with reassuring labs and reassuring [name].  We plan for surgery next week and I went over the risk extensively with the patient. I was calling her mom as a courtesy but unfortunately the phone got disconnected in the middle of the conversation.  I tried to call her back and give her the number to call but have not heard back from her. I tried calling two more times.   If the mother has any questions, I am happy to talk to her next week.   Plan for surgery 04/15/2020. Laparoscopic cholecystectomy as her recent  LFTs and bilirubin were normal, do not think we will need a cholangiogram.   Algis Greenhouse, MD [facility] [address] Vella Raring [place], [place] [address] [phone] (office)

## 2020-04-08 DIAGNOSIS — R7989 Other specified abnormal findings of blood chemistry: Secondary | ICD-10-CM

## 2020-04-08 DIAGNOSIS — K8020 Calculus of gallbladder without cholecystitis without obstruction: Secondary | ICD-10-CM

## 2020-04-08 DIAGNOSIS — R945 Abnormal results of liver function studies: Secondary | ICD-10-CM

## 2020-04-09 ENCOUNTER — Encounter: Payer: Self-pay | Admitting: General Surgery

## 2020-04-11 ENCOUNTER — Other Ambulatory Visit: Payer: Self-pay

## 2020-04-11 DIAGNOSIS — K8020 Calculus of gallbladder without cholecystitis without obstruction: Secondary | ICD-10-CM

## 2020-04-11 NOTE — Patient Instructions (Signed)
Kathleen Grimes  04/11/2020     @PREFPERIOPPHARMACY @   Your procedure is scheduled on  04/15/2020 .  Report to 04/17/2020 at  0740  A.M.  Call this number if you have problems the morning of surgery:  [phone]   Remember:  Do not eat or drink after midnight.                        Take these medicines the morning of surgery with A SIP OF WATER reglan    Do not wear jewelry, make-up or nail polish.  Do not wear lotions, powders, or perfumes. Please wear deodorant and brush your teeth.  Do not shave 48 hours prior to surgery.  Men may shave face and neck.  Do not bring valuables to the hospital.  [facility] is not responsible for any belongings or valuables.  Contacts, dentures or bridgework may not be worn into surgery.  Leave your suitcase in the car.  After surgery it may be brought to your room.  For patients admitted to the hospital, discharge time will be determined by your treatment team.  Patients discharged the day of surgery will not be allowed to drive home.   Name and phone number of your driver:   family Special instructions:  DO NOT smoke the morning of your procedure.  Please read over the following fact sheets that you were given. Anesthesia Post-op Instructions and Care and Recovery After Surgery       Cataract Surgery, Care After This sheet gives you information about how to care for yourself after your procedure. Your health care provider may also give you more specific instructions. If you have problems or questions, contact your health care provider. What can I expect after the procedure? After the procedure, it is common to have:  Itching.  Discomfort.  Fluid discharge.  Sensitivity to light and to touch.  Bruising in or around the eye.  Mild blurred vision. Follow these instructions at home: Eye care   Do not touch or rub your eyes.  Protect your eyes as told by your health care provider. You may be told to wear a  protective eye shield or sunglasses.  Do not put a contact lens into the affected eye or eyes until your health care provider approves.  Keep the area around your eye clean and dry: ? Avoid swimming. ? Do not allow water to hit you directly in the face while showering. ? Keep soap and shampoo out of your eyes.  Check your eye every day for signs of infection. Watch for: ? Redness, swelling, or pain. ? Fluid, blood, or pus. ? Warmth. ? A bad smell. ? Vision that is getting worse. ? Sensitivity that is getting worse. Activity  Do not drive for 24 hours if you were given a sedative during your procedure.  Avoid strenuous activities, such as playing contact sports, for as long as told by your health care provider.  Do not drive or use heavy machinery until your health care provider approves.  Do not bend or lift heavy objects. Bending increases pressure in the eye. You can walk, climb stairs, and do light household chores.  Ask your health care provider when you can return to work. If you work in a dusty environment, you may be advised to wear protective eyewear for a period of time. General instructions  Take or apply over-the-counter and prescription medicines only  as told by your health care provider. This includes eye drops.  Keep all follow-up visits as told by your health care provider. This is important. Contact a health care provider if:  You have increased bruising around your eye.  You have pain that is not helped with medicine.  You have a fever.  You have redness, swelling, or pain in your eye.  You have fluid, blood, or pus coming from your incision.  Your vision gets worse.  Your sensitivity to light gets worse. Get help right away if:  You have sudden loss of vision.  You see flashes of light or spots (floaters).  You have severe eye pain.  You develop nausea or vomiting. Summary  After your procedure, it is common to have itching, discomfort,  bruising, fluid discharge, or sensitivity to light.  Follow instructions from your health care provider about caring for your eye after the procedure.  Do not rub your eye after the procedure. You may need to wear eye protection or sunglasses. Do not wear contact lenses. Keep the area around your eye clean and dry.  Avoid activities that require a lot of effort. These include playing sports and lifting heavy objects.  Contact a health care provider if you have increased bruising, pain that does not go away, or a fever. Get help right away if you suddenly lose your vision, see flashes of light or spots, or have severe pain in the eye. This information is not intended to replace advice given to you by your health care provider. Make sure you discuss any questions you have with your health care provider. Document Revised: 09/29/2019 Document Reviewed: 06/02/2018 Elsevier Patient Education  2020 [place] Anesthesia, Adult, Care After This sheet gives you information about how to care for yourself after your procedure. Your health care provider may also give you more specific instructions. If you have problems or questions, contact your health care provider. What can I expect after the procedure? After the procedure, the following side effects are common:  Pain or discomfort at the IV site.  Nausea.  Vomiting.  Sore throat.  Trouble concentrating.  Feeling cold or chills.  Weak or tired.  Sleepiness and fatigue.  Soreness and body aches. These side effects can affect parts of the body that were not involved in surgery. Follow these instructions at home:  For at least 24 hours after the procedure:  Have a responsible adult stay with you. It is important to have someone help care for you until you are awake and alert.  Rest as needed.  Do not: ? Participate in activities in which you could fall or become injured. ? Drive. ? Use heavy machinery. ? Drink  alcohol. ? Take sleeping pills or medicines that cause drowsiness. ? Make important decisions or sign legal documents. ? Take care of children on your own. Eating and drinking  Follow any instructions from your health care provider about eating or drinking restrictions.  When you feel hungry, start by eating small amounts of foods that are soft and easy to digest (bland), such as toast. Gradually return to your regular diet.  Drink enough fluid to keep your urine pale yellow.  If you vomit, rehydrate by drinking water, juice, or clear broth. General instructions  If you have sleep apnea, surgery and certain medicines can increase your risk for breathing problems. Follow instructions from your health care provider about wearing your sleep device: ? Anytime you are sleeping, including during daytime naps. ?  While taking prescription pain medicines, sleeping medicines, or medicines that make you drowsy.  Return to your normal activities as told by your health care provider. Ask your health care provider what activities are safe for you.  Take over-the-counter and prescription medicines only as told by your health care provider.  If you smoke, do not smoke without supervision.  Keep all follow-up visits as told by your health care provider. This is important. Contact a health care provider if:  You have nausea or vomiting that does not get better with medicine.  You cannot eat or drink without vomiting.  You have pain that does not get better with medicine.  You are unable to pass urine.  You develop a skin rash.  You have a fever.  You have redness around your IV site that gets worse. Get help right away if:  You have difficulty breathing.  You have chest pain.  You have blood in your urine or stool, or you vomit blood. Summary  After the procedure, it is common to have a sore throat or nausea. It is also common to feel tired.  Have a responsible adult stay with you  for the first 24 hours after general anesthesia. It is important to have someone help care for you until you are awake and alert.  When you feel hungry, start by eating small amounts of foods that are soft and easy to digest (bland), such as toast. Gradually return to your regular diet.  Drink enough fluid to keep your urine pale yellow.  Return to your normal activities as told by your health care provider. Ask your health care provider what activities are safe for you. This information is not intended to replace advice given to you by your health care provider. Make sure you discuss any questions you have with your health care provider. Document Revised: 12/06/2017 Document Reviewed: 07/19/2017 Elsevier Patient Education  2020 [employer]. How to Use Chlorhexidine for Bathing Chlorhexidine gluconate (CHG) is a germ-killing (antiseptic) solution that is used to clean the skin. It can get rid of the bacteria that normally live on the skin and can keep them away for about 24 hours. To clean your skin with CHG, you may be given:  A CHG solution to use in the shower or as part of a sponge bath.  A prepackaged cloth that contains CHG. Cleaning your skin with CHG may help lower the risk for infection:  While you are staying in the intensive care unit of the hospital.  If you have a vascular access, such as a central line, to provide short-term or long-term access to your veins.  If you have a catheter to drain urine from your bladder.  If you are on a ventilator. A ventilator is a machine that helps you breathe by moving air in and out of your lungs.  After surgery. What are the risks? Risks of using CHG include:  A skin reaction.  Hearing loss, if CHG gets in your ears.  Eye injury, if CHG gets in your eyes and is not rinsed out.  The CHG product catching fire. Make sure that you avoid smoking and flames after applying CHG to your skin. Do not use CHG:  If you have a  chlorhexidine allergy or have previously reacted to chlorhexidine.  On babies younger than 64 months of age. How to use CHG solution  Use CHG only as told by your health care provider, and follow the instructions on the label.  Use  the full amount of CHG as directed. Usually, this is one bottle. During a shower Follow these steps when using CHG solution during a shower (unless your health care provider gives you different instructions): 1. Start the shower. 2. Use your normal soap and shampoo to wash your face and hair. 3. Turn off the shower or move out of the shower stream. 4. Pour the CHG onto a clean washcloth. Do not use any type of brush or rough-edged sponge. 5. Starting at your neck, lather your body down to your toes. Make sure you follow these instructions: ? If you will be having surgery, pay special attention to the part of your body where you will be having surgery. Scrub this area for at least 1 minute. ? Do not use CHG on your head or face. If the solution gets into your ears or eyes, rinse them well with water. ? Avoid your genital area. ? Avoid any areas of skin that have broken skin, cuts, or scrapes. ? Scrub your back and under your arms. Make sure to wash skin folds. 6. Let the lather sit on your skin for 1-2 minutes or as long as told by your health care provider. 7. Thoroughly rinse your entire body in the shower. Make sure that all body creases and crevices are rinsed well. 8. Dry off with a clean towel. Do not put any substances on your body afterward--such as powder, lotion, or perfume--unless you are told to do so by your health care provider. Only use lotions that are recommended by the manufacturer. 9. Put on clean clothes or pajamas. 10. If it is the night before your surgery, sleep in clean sheets.  During a sponge bath Follow these steps when using CHG solution during a sponge bath (unless your health care provider gives you different instructions): 1. Use  your normal soap and shampoo to wash your face and hair. 2. Pour the CHG onto a clean washcloth. 3. Starting at your neck, lather your body down to your toes. Make sure you follow these instructions: ? If you will be having surgery, pay special attention to the part of your body where you will be having surgery. Scrub this area for at least 1 minute. ? Do not use CHG on your head or face. If the solution gets into your ears or eyes, rinse them well with water. ? Avoid your genital area. ? Avoid any areas of skin that have broken skin, cuts, or scrapes. ? Scrub your back and under your arms. Make sure to wash skin folds. 4. Let the lather sit on your skin for 1-2 minutes or as long as told by your health care provider. 5. Using a different clean, wet washcloth, thoroughly rinse your entire body. Make sure that all body creases and crevices are rinsed well. 6. Dry off with a clean towel. Do not put any substances on your body afterward--such as powder, lotion, or perfume--unless you are told to do so by your health care provider. Only use lotions that are recommended by the manufacturer. 7. Put on clean clothes or pajamas. 8. If it is the night before your surgery, sleep in clean sheets. How to use CHG prepackaged cloths  Only use CHG cloths as told by your health care provider, and follow the instructions on the label.  Use the CHG cloth on clean, dry skin.  Do not use the CHG cloth on your head or face unless your health care provider tells you to.  When washing  with the CHG cloth: ? Avoid your genital area. ? Avoid any areas of skin that have broken skin, cuts, or scrapes. Before surgery Follow these steps when using a CHG cloth to clean before surgery (unless your health care provider gives you different instructions): 1. Using the CHG cloth, vigorously scrub the part of your body where you will be having surgery. Scrub using a back-and-forth motion for 3 minutes. The area on your body  should be completely wet with CHG when you are done scrubbing. 2. Do not rinse. Discard the cloth and let the area air-dry. Do not put any substances on the area afterward, such as powder, lotion, or perfume. 3. Put on clean clothes or pajamas. 4. If it is the night before your surgery, sleep in clean sheets.  For general bathing Follow these steps when using CHG cloths for general bathing (unless your health care provider gives you different instructions). 1. Use a separate CHG cloth for each area of your body. Make sure you wash between any folds of skin and between your fingers and toes. Wash your body in the following order, switching to a new cloth after each step: ? The front of your neck, shoulders, and chest. ? Both of your arms, under your arms, and your hands. ? Your stomach and groin area, avoiding the genitals. ? Your right leg and foot. ? Your left leg and foot. ? The back of your neck, your back, and your buttocks. 2. Do not rinse. Discard the cloth and let the area air-dry. Do not put any substances on your body afterward--such as powder, lotion, or perfume--unless you are told to do so by your health care provider. Only use lotions that are recommended by the manufacturer. 3. Put on clean clothes or pajamas. Contact a health care provider if:  Your skin gets irritated after scrubbing.  You have questions about using your solution or cloth. Get help right away if:  Your eyes become very red or swollen.  Your eyes itch badly.  Your skin itches badly and is red or swollen.  Your hearing changes.  You have trouble seeing.  You have swelling or tingling in your mouth or throat.  You have trouble breathing.  You swallow any chlorhexidine. Summary  Chlorhexidine gluconate (CHG) is a germ-killing (antiseptic) solution that is used to clean the skin. Cleaning your skin with CHG may help to lower your risk for infection.  You may be given CHG to use for bathing. It may  be in a bottle or in a prepackaged cloth to use on your skin. Carefully follow your health care provider's instructions and the instructions on the product label.  Do not use CHG if you have a chlorhexidine allergy.  Contact your health care provider if your skin gets irritated after scrubbing. This information is not intended to replace advice given to you by your health care provider. Make sure you discuss any questions you have with your health care provider. Document Revised: 02/19/2019 Document Reviewed: 10/31/2017 Elsevier Patient Education  2020 [employer].

## 2020-04-11 NOTE — Pre-Procedure Instructions (Signed)
Notified Dr Alva Garnet on upcoming PAT and procedure. He wants to see patient at her PAT.

## 2020-04-12 ENCOUNTER — Encounter (HOSPITAL_COMMUNITY)
Admission: RE | Admit: 2020-04-12 | Discharge: 2020-04-12 | Disposition: A | Discharge status: Home / Self Care | Payer: Medicaid Other | Source: Ambulatory Visit | Attending: General Surgery | Admitting: General Surgery

## 2020-04-12 ENCOUNTER — Other Ambulatory Visit (HOSPITAL_COMMUNITY)
Admission: RE | Admit: 2020-04-12 | Discharge: 2020-04-12 | Disposition: A | Discharge status: Home / Self Care | Payer: Medicaid Other | Source: Ambulatory Visit | Attending: General Surgery | Admitting: General Surgery

## 2020-04-12 ENCOUNTER — Encounter (HOSPITAL_COMMUNITY): Payer: Self-pay

## 2020-04-12 ENCOUNTER — Other Ambulatory Visit: Payer: Self-pay

## 2020-04-12 DIAGNOSIS — Z20822 Contact with and (suspected) exposure to covid-19: Secondary | ICD-10-CM | POA: Insufficient documentation

## 2020-04-12 DIAGNOSIS — Z01812 Encounter for preprocedural laboratory examination: Secondary | ICD-10-CM | POA: Diagnosis not present

## 2020-04-12 LAB — COMPREHENSIVE METABOLIC PANEL
ALT: 34 U/L (ref 0–44)
AST: 21 U/L (ref 15–41)
Albumin: 4 g/dL (ref 3.5–5.0)
Alkaline Phosphatase: 38 U/L (ref 38–126)
Anion gap: 7 (ref 5–15)
BUN: 5 mg/dL — ABNORMAL LOW (ref 6–20)
CO2: 23 mmol/L (ref 22–32)
Calcium: 8.8 mg/dL — ABNORMAL LOW (ref 8.9–10.3)
Chloride: 104 mmol/L (ref 98–111)
Creatinine, Ser: 0.48 mg/dL (ref 0.44–1.00)
GFR calc Af Amer: >60 mL/min (ref >60)
GFR calc non Af Amer: >60 mL/min (ref >60)
Glucose, Bld: 84 mg/dL (ref 70–99)
Potassium: 3.7 mmol/L (ref 3.5–5.1)
Sodium: 134 mmol/L — ABNORMAL LOW (ref 135–145)
Total Bilirubin: 0.9 mg/dL (ref 0.3–1.2)
Total Protein: 6.1 g/dL — ABNORMAL LOW (ref 6.5–8.1)

## 2020-04-13 ENCOUNTER — Ambulatory Visit (HOSPITAL_COMMUNITY): Payer: Medicaid Other | Admitting: Anesthesiology

## 2020-04-13 ENCOUNTER — Inpatient Hospital Stay (HOSPITAL_COMMUNITY)
Admission: RE | Admit: 2020-04-13 | Discharge: 2020-04-13 | Disposition: A | Discharge status: Home / Self Care | Payer: Medicaid Other | Source: Ambulatory Visit

## 2020-04-13 ENCOUNTER — Other Ambulatory Visit: Payer: Self-pay

## 2020-04-13 ENCOUNTER — Encounter (HOSPITAL_COMMUNITY)
Admission: RE | Disposition: A | Discharge status: Home / Self Care | Payer: Self-pay | Source: Home / Self Care | Attending: General Surgery

## 2020-04-13 ENCOUNTER — Ambulatory Visit (HOSPITAL_COMMUNITY)
Admission: RE | Admit: 2020-04-13 | Discharge: 2020-04-13 | Disposition: A | Discharge status: Home / Self Care | Payer: Medicaid Other | Attending: General Surgery | Admitting: General Surgery

## 2020-04-13 ENCOUNTER — Other Ambulatory Visit (HOSPITAL_COMMUNITY): Payer: Medicaid Other

## 2020-04-13 ENCOUNTER — Encounter (HOSPITAL_COMMUNITY): Payer: Self-pay | Admitting: General Surgery

## 2020-04-13 ENCOUNTER — Encounter (HOSPITAL_COMMUNITY): Payer: Medicaid Other

## 2020-04-13 DIAGNOSIS — Z3A10 10 weeks gestation of pregnancy: Secondary | ICD-10-CM | POA: Diagnosis not present

## 2020-04-13 DIAGNOSIS — K8020 Calculus of gallbladder without cholecystitis without obstruction: Secondary | ICD-10-CM | POA: Diagnosis present

## 2020-04-13 DIAGNOSIS — R945 Abnormal results of liver function studies: Secondary | ICD-10-CM | POA: Diagnosis not present

## 2020-04-13 DIAGNOSIS — O99611 Diseases of the digestive system complicating pregnancy, first trimester: Secondary | ICD-10-CM | POA: Insufficient documentation

## 2020-04-13 DIAGNOSIS — K8010 Calculus of gallbladder with chronic cholecystitis without obstruction: Secondary | ICD-10-CM | POA: Diagnosis present

## 2020-04-13 HISTORY — PX: CHOLECYSTECTOMY: SHX55

## 2020-04-13 LAB — SARS CORONAVIRUS 2 (TAT 6-24 HRS): SARS Coronavirus 2: NEGATIVE

## 2020-04-13 SURGERY — LAPAROSCOPIC CHOLECYSTECTOMY
Anesthesia: General

## 2020-04-13 MED ORDER — DEXAMETHASONE SODIUM PHOSPHATE 4 MG/ML IJ SOLN
INTRAMUSCULAR | Status: DC | PRN
  Administered 2020-04-13: 10 mg via INTRAVENOUS

## 2020-04-13 MED ORDER — FENTANYL CITRATE (PF) 100 MCG/2ML IJ SOLN
INTRAMUSCULAR | Status: DC | PRN
  Administered 2020-04-13 (×5): 50 ug via INTRAVENOUS

## 2020-04-13 MED ORDER — ACETAMINOPHEN 10 MG/ML IV SOLN
INTRAVENOUS | Status: AC
  Filled 2020-04-13: qty 100

## 2020-04-13 MED ORDER — HYDROCODONE-ACETAMINOPHEN 5-325 MG PO TABS: 1.0000 | ORAL_TABLET | ORAL | 0 refills | Status: DC | PRN

## 2020-04-13 MED ORDER — CHLORHEXIDINE GLUCONATE CLOTH 2 % EX PADS: 6.0000 | MEDICATED_PAD | Freq: Once | CUTANEOUS | Status: DC

## 2020-04-13 MED ORDER — MEPERIDINE HCL 50 MG/ML IJ SOLN: 6.2500 mg | INTRAMUSCULAR | Status: DC | PRN

## 2020-04-13 MED ORDER — SODIUM CHLORIDE 0.9 % IV SOLN
2.0000 g | INTRAVENOUS | Status: AC
  Administered 2020-04-13: 2 g via INTRAVENOUS
  Filled 2020-04-13: qty 2

## 2020-04-13 MED ORDER — BUPIVACAINE HCL (PF) 0.5 % IJ SOLN
INTRAMUSCULAR | Status: AC
  Filled 2020-04-13: qty 30

## 2020-04-13 MED ORDER — BUPIVACAINE HCL (PF) 0.5 % IJ SOLN
INTRAMUSCULAR | Status: DC | PRN
  Administered 2020-04-13: 9 mL

## 2020-04-13 MED ORDER — SUGAMMADEX SODIUM 500 MG/5ML IV SOLN
INTRAVENOUS | Status: AC
  Filled 2020-04-13: qty 5

## 2020-04-13 MED ORDER — PROMETHAZINE HCL 25 MG/ML IJ SOLN
6.2500 mg | INTRAMUSCULAR | Status: DC | PRN
  Administered 2020-04-13: 6.25 mg via INTRAVENOUS
  Filled 2020-04-13: qty 1

## 2020-04-13 MED ORDER — ONDANSETRON HCL 4 MG/2ML IJ SOLN
INTRAMUSCULAR | Status: DC | PRN
  Administered 2020-04-13: 4 mg via INTRAVENOUS

## 2020-04-13 MED ORDER — LACTATED RINGERS IV SOLN
INTRAVENOUS | Status: DC | PRN
Start: 2020-04-13 — End: 2020-04-13

## 2020-04-13 MED ORDER — LACTATED RINGERS IV SOLN
Freq: Once | INTRAVENOUS | Status: AC
  Administered 2020-04-13: 12:00:00 1000 mL via INTRAVENOUS

## 2020-04-13 MED ORDER — PROPOFOL 10 MG/ML IV BOLUS
INTRAVENOUS | Status: DC | PRN
  Administered 2020-04-13: 200 mg via INTRAVENOUS

## 2020-04-13 MED ORDER — SODIUM CHLORIDE 0.9 % IV SOLN
INTRAVENOUS | Status: AC
  Filled 2020-04-13: qty 2

## 2020-04-13 MED ORDER — HYDROMORPHONE HCL 1 MG/ML IJ SOLN
0.2500 mg | INTRAMUSCULAR | Status: DC | PRN
  Administered 2020-04-13 (×3): 0.5 mg via INTRAVENOUS
  Filled 2020-04-13 (×3): qty 0.5

## 2020-04-13 MED ORDER — DOCUSATE SODIUM 100 MG PO CAPS
100.0000 mg | ORAL_CAPSULE | Freq: Two times a day (BID) | ORAL | 0 refills | Status: DC | PRN
Start: 2020-04-13 — End: 2020-06-07

## 2020-04-13 MED ORDER — SODIUM CHLORIDE 0.9 % IR SOLN
Status: DC | PRN
  Administered 2020-04-13: 1000 mL

## 2020-04-13 MED ORDER — FENTANYL CITRATE (PF) 100 MCG/2ML IJ SOLN
INTRAMUSCULAR | Status: AC
  Filled 2020-04-13: qty 2

## 2020-04-13 MED ORDER — ROCURONIUM BROMIDE 100 MG/10ML IV SOLN
INTRAVENOUS | Status: DC | PRN
  Administered 2020-04-13: 40 mg via INTRAVENOUS

## 2020-04-13 MED ORDER — SUGAMMADEX SODIUM 200 MG/2ML IV SOLN
INTRAVENOUS | Status: DC | PRN
  Administered 2020-04-13: 150 mg via INTRAVENOUS

## 2020-04-13 MED ORDER — ACETAMINOPHEN 10 MG/ML IV SOLN
1000.0000 mg | Freq: Once | INTRAVENOUS | Status: AC
  Administered 2020-04-13: 1000 mg via INTRAVENOUS

## 2020-04-13 MED ORDER — LIDOCAINE HCL (CARDIAC) PF 100 MG/5ML IV SOSY
PREFILLED_SYRINGE | INTRAVENOUS | Status: DC | PRN
  Administered 2020-04-13: 50 mg via INTRAVENOUS

## 2020-04-13 MED ORDER — HEMOSTATIC AGENTS (NO CHARGE) OPTIME
TOPICAL | Status: DC | PRN
  Administered 2020-04-13: 1 via TOPICAL

## 2020-04-13 SURGICAL SUPPLY — 50 items
ADH SKN CLS APL DERMABOND .7 (GAUZE/BANDAGES/DRESSINGS) ×1
APL PRP STRL LF DISP 70% ISPRP (MISCELLANEOUS) ×1
APPLIER CLIP ROT 10 11.4 M/L (STAPLE) ×3
APR CLP MED LRG 11.4X10 (STAPLE) ×1
BAG RETRIEVAL 10 (BASKET) ×1
BAG RETRIEVAL 10MM (BASKET) ×1
BLADE SURG 15 STRL LF DISP TIS (BLADE) ×1 IMPLANT
BLADE SURG 15 STRL SS (BLADE) ×3
CHLORAPREP W/TINT 26 (MISCELLANEOUS) ×3 IMPLANT
CLIP APPLIE ROT 10 11.4 M/L (STAPLE) ×1 IMPLANT
CLOTH BEACON ORANGE TIMEOUT ST (SAFETY) ×3 IMPLANT
COVER LIGHT HANDLE STERIS (MISCELLANEOUS) ×6 IMPLANT
COVER WAND RF STERILE (DRAPES) ×3 IMPLANT
DECANTER SPIKE VIAL GLASS SM (MISCELLANEOUS) ×3 IMPLANT
DERMABOND ADVANCED (GAUZE/BANDAGES/DRESSINGS) ×2
DERMABOND ADVANCED .7 DNX12 (GAUZE/BANDAGES/DRESSINGS) ×1 IMPLANT
ELECT REM PT RETURN 9FT ADLT (ELECTROSURGICAL) ×3
ELECTRODE REM PT RTRN 9FT ADLT (ELECTROSURGICAL) ×1 IMPLANT
GLOVE BIO SURGEON STRL SZ 6.5 (GLOVE) ×2 IMPLANT
GLOVE BIO SURGEONS STRL SZ 6.5 (GLOVE) ×1
GLOVE BIOGEL M 7.0 STRL (GLOVE) ×2 IMPLANT
GLOVE BIOGEL PI IND STRL 6.5 (GLOVE) ×1 IMPLANT
GLOVE BIOGEL PI IND STRL 7.0 (GLOVE) ×3 IMPLANT
GLOVE BIOGEL PI IND STRL 7.5 (GLOVE) IMPLANT
GLOVE BIOGEL PI INDICATOR 6.5 (GLOVE) ×2
GLOVE BIOGEL PI INDICATOR 7.0 (GLOVE) ×8
GLOVE BIOGEL PI INDICATOR 7.5 (GLOVE) ×2
GOWN STRL REUS W/TWL LRG LVL3 (GOWN DISPOSABLE) ×9 IMPLANT
HEMOSTAT SNOW SURGICEL 2X4 (HEMOSTASIS) ×3 IMPLANT
INST SET LAPROSCOPIC AP (KITS) ×3 IMPLANT
KIT TURNOVER KIT A (KITS) ×3 IMPLANT
MANIFOLD NEPTUNE II (INSTRUMENTS) ×3 IMPLANT
NDL INSUFFLATION 14GA 120MM (NEEDLE) ×1 IMPLANT
NEEDLE INSUFFLATION 14GA 120MM (NEEDLE) ×3 IMPLANT
NS IRRIG 1000ML POUR BTL (IV SOLUTION) ×3 IMPLANT
PACK LAP CHOLE LZT030E (CUSTOM PROCEDURE TRAY) ×2 IMPLANT
PAD ARMBOARD 7.5X6 YLW CONV (MISCELLANEOUS) ×3 IMPLANT
SET BASIN LINEN APH (SET/KITS/TRAYS/PACK) ×3 IMPLANT
SET TUBE SMOKE EVAC HIGH FLOW (TUBING) ×3 IMPLANT
SLEEVE ENDOPATH XCEL 5M (ENDOMECHANICALS) ×3 IMPLANT
SUT MNCRL AB 4-0 PS2 18 (SUTURE) ×5 IMPLANT
SUT VICRYL 0 UR6 27IN ABS (SUTURE) ×3 IMPLANT
SYS BAG RETRIEVAL 10MM (BASKET) ×1
SYSTEM BAG RETRIEVAL 10MM (BASKET) ×1 IMPLANT
TROCAR ENDO BLADELESS 11MM (ENDOMECHANICALS) ×3 IMPLANT
TROCAR XCEL NON-BLD 5MMX100MML (ENDOMECHANICALS) ×3 IMPLANT
TROCAR XCEL UNIV SLVE 11M 100M (ENDOMECHANICALS) ×3 IMPLANT
TUBE CONNECTING 12'X1/4 (SUCTIONS) ×1
TUBE CONNECTING 12X1/4 (SUCTIONS) ×2 IMPLANT
WARMER LAPAROSCOPE (MISCELLANEOUS) ×3 IMPLANT

## 2020-04-13 NOTE — Discharge Instructions (Addendum)
General Anesthesia, Adult, Care After This sheet gives you information about how to care for yourself after your procedure. Your health care provider may also give you more specific instructions. If you have problems or questions, contact your health care provider. What can I expect after the procedure? After the procedure, the following side effects are common:  Pain or discomfort at the IV site.  Nausea.  Vomiting.  Sore throat.  Trouble concentrating.  Feeling cold or chills.  Weak or tired.  Sleepiness and fatigue.  Soreness and body aches. These side effects can affect parts of the body that were not involved in surgery. Follow these instructions at home:  For at least 24 hours after the procedure:  Have a responsible adult stay with you. It is important to have someone help care for you until you are awake and alert.  Rest as needed.  Do not: ? Participate in activities in which you could fall or become injured. ? Drive. ? Use heavy machinery. ? Drink alcohol. ? Take sleeping pills or medicines that cause drowsiness. ? Make important decisions or sign legal documents. ? Take care of children on your own. Eating and drinking  Follow any instructions from your health care provider about eating or drinking restrictions.  When you feel hungry, start by eating small amounts of foods that are soft and easy to digest (bland), such as toast. Gradually return to your regular diet.  Drink enough fluid to keep your urine pale yellow.  If you vomit, rehydrate by drinking water, juice, or clear broth. General instructions  If you have sleep apnea, surgery and certain medicines can increase your risk for breathing problems. Follow instructions from your health care provider about wearing your sleep device: ? Anytime you are sleeping, including during daytime naps. ? While taking prescription pain medicines, sleeping medicines, or medicines that make you drowsy.  Return to  your normal activities as told by your health care provider. Ask your health care provider what activities are safe for you.  Take over-the-counter and prescription medicines only as told by your health care provider.  If you smoke, do not smoke without supervision.  Keep all follow-up visits as told by your health care provider. This is important. Contact a health care provider if:  You have nausea or vomiting that does not get better with medicine.  You cannot eat or drink without vomiting.  You have pain that does not get better with medicine.  You are unable to pass urine.  You develop a skin rash.  You have a fever.  You have redness around your IV site that gets worse. Get help right away if:  You have difficulty breathing.  You have chest pain.  You have blood in your urine or stool, or you vomit blood. Summary  After the procedure, it is common to have a sore throat or nausea. It is also common to feel tired.  Have a responsible adult stay with you for the first 24 hours after general anesthesia. It is important to have someone help care for you until you are awake and alert.  When you feel hungry, start by eating small amounts of foods that are soft and easy to digest (bland), such as toast. Gradually return to your regular diet.  Drink enough fluid to keep your urine pale yellow.  Return to your normal activities as told by your health care provider. Ask your health care provider what activities are safe for you. This information is not   intended to replace advice given to you by your health care provider. Make sure you discuss any questions you have with your health care provider. Document Revised: 12/06/2017 Document Reviewed: 07/19/2017 Elsevier Patient Education  [place]. Discharge Laparoscopic Surgery Instructions:  Common Complaints: Right shoulder pain is common after laparoscopic surgery. This is secondary to the gas used in the surgery being  trapped under the diaphragm.  Walk to help your body absorb the gas. This will improve in a few days. Pain at the port sites are common, especially the larger port sites. This will improve with time.  Some nausea is common and poor appetite. The main goal is to stay hydrated the first few days after surgery.   Diet/ Activity: Diet as tolerated. You may not have an appetite, but it is important to stay hydrated. Drink 64 ounces of water a day. Your appetite will return with time.  Shower per your regular routine daily.  Do not take hot showers. Take warm showers that are less than 10 minutes. Rest and listen to your body, but do not remain in bed all day.  Walk everyday for at least 15-20 minutes. Deep cough and move around every 1-2 hours in the first few days after surgery.  Do not lift > 10 lbs, perform excessive bending, pushing, pulling, squatting for 1-2 weeks after surgery.  Do not pick at the dermabond glue on your incision sites.  This glue film will remain in place for 1-2 weeks and will start to peel off.  Do not place lotions or balms on your incision unless instructed to specifically by Dr. Constance Haw.   Pain Expectations and Narcotics: -After surgery you will have pain associated with your incisions and this is normal. The pain is muscular and nerve pain, and will get better with time. -You are encouraged and expected to take non narcotic medications like tylenol and ibuprofen (when able) to treat pain as multiple modalities can aid with pain treatment. -Narcotics are only used when pain is severe or there is breakthrough pain. -You are not expected to have a pain score of 0 after surgery, as we cannot prevent pain. A pain score of 3-4 that allows you to be functional, move, walk, and tolerate some activity is the goal. The pain will continue to improve over the days after surgery and is dependent on your surgery. -Due to [place] law, we are only able to give a certain amount of pain  medication to treat post operative pain, and we only give additional narcotics on a patient by patient basis.  -For most laparoscopic surgery, studies have shown that the majority of patients only need 10-15 narcotic pills, and for open surgeries most patients only need 15-20.   -Having appropriate expectations of pain and knowledge of pain management with non narcotics is important as we do not want anyone to become addicted to narcotic pain medication.  -Using ice packs in the first 48 hours and heating pads after 48 hours, wearing an abdominal binder (when recommended), and using over the counter medications are all ways to help with pain management.   -Simple acts like meditation and mindfulness practices after surgery can also help with pain control and research has proven the benefit of these practices.  Medication:l Take tylenol as needed for pain control,  every 4-6 hours.  Do not exceed 4000mg  of tylenol a day. Take Norco for breakthrough pain every 4 hours.  Take Colace for constipation related to narcotic pain medication. If  you do not have a bowel movement in 2 days, take Miralax over the counter.  Drink plenty of water to also prevent constipation.   Contact Information: If you have questions or concerns, please call our office, [phone], Monday- Thursday 8AM-5PM and Friday 8AM-12Noon.  If it is after hours or on the weekend, please call [facility], [phone], and ask to speak to the surgeon on call for Dr. Henreitta Leber at [facility].    Laparoscopic Cholecystectomy, Care After This sheet gives you information about how to care for yourself after your procedure. Your doctor may also give you more specific instructions. If you have problems or questions, contact your doctor. Follow these instructions at home: Care for cuts from surgery (incisions)   Follow instructions from your doctor about how to take care of your cuts from surgery. Make sure you: ? Wash your hands  with soap and water before you change your bandage (dressing). If you cannot use soap and water, use hand sanitizer. ? Change your bandage as told by your doctor. ? Leave stitches (sutures), skin glue, or skin tape (adhesive) strips in place. They may need to stay in place for 2 weeks or longer. If tape strips get loose and curl up, you may trim the loose edges. Do not remove tape strips completely unless your doctor says it is okay.  Do not take baths, swim, or use a hot tub until your doctor says it is okay.   You are ok to shower.  Check your surgical cut area every day for signs of infection. Check for: ? More redness, swelling, or pain. ? More fluid or blood. ? Warmth. ? Pus or a bad smell. Activity  Do not drive or use heavy machinery while taking prescription pain medicine.  Do not lift anything that is heavier than 10 lb (4.5 kg) until your doctor says it is okay.  Do not play contact sports until your doctor says it is okay.  Do not drive for 24 hours if you were given a medicine to help you relax (sedative).  Rest as needed. Do not return to work or school until your doctor says it is okay. General instructions  Take over-the-counter and prescription medicines only as told by your doctor.  To prevent or treat constipation while you are taking prescription pain medicine, your doctor may recommend that you: ? Drink enough fluid to keep your pee (urine) clear or pale yellow. ? Take over-the-counter or prescription medicines. ? Eat foods that are high in fiber, such as fresh fruits and vegetables, whole grains, and beans. ? Limit foods that are high in fat and processed sugars, such as fried and sweet foods. Contact a doctor if:  You develop a rash.  You have more redness, swelling, or pain around your surgical cuts.  You have more fluid or blood coming from your surgical cuts.  Your surgical cuts feel warm to the touch.  You have pus or a bad smell coming from your  surgical cuts.  You have a fever.  One or more of your surgical cuts breaks open. Get help right away if:  You have trouble breathing.  You have chest pain.  You have pain that is getting worse in your shoulders.  You faint or feel dizzy when you stand.  You have very bad pain in your belly (abdomen).  You are sick to your stomach (nauseous) for more than one day.  You have throwing up (vomiting) that lasts for more than  one day.  You have leg pain. This information is not intended to replace advice given to you by your health care provider. Make sure you discuss any questions you have with your health care provider. Document Revised: 11/15/2017 Document Reviewed: 05/21/2016 Elsevier Patient Education  2020 [employer].   First Trimester of Pregnancy The first trimester of pregnancy is from week 1 until the end of week 13 (months 1 through 3). A week after a sperm fertilizes an egg, the egg will implant on the wall of the uterus. This embryo will begin to develop into a baby. Genes from you and your partner will form the baby. The female genes will determine whether the baby will be a boy or a girl. At 6-8 weeks, the eyes and face will be formed, and the heartbeat can be seen on ultrasound. At the end of 12 weeks, all the baby's organs will be formed. Now that you are pregnant, you will want to do everything you can to have a healthy baby. Two of the most important things are to get good prenatal care and to follow your health care provider's instructions. Prenatal care is all the medical care you receive before the baby's birth. This care will help prevent, find, and treat any problems during the pregnancy and childbirth. Body changes during your first trimester Your body goes through many changes during pregnancy. The changes vary from woman to woman.  You may gain or lose a couple of pounds at first.  You may feel sick to your stomach (nauseous) and you may throw up (vomit). If  the vomiting is uncontrollable, call your health care provider.  You may tire easily.  You may develop headaches that can be relieved by medicines. All medicines should be approved by your health care provider.  You may urinate more often. Painful urination may mean you have a bladder infection.  You may develop heartburn as a result of your pregnancy.  You may develop constipation because certain hormones are causing the muscles that push stool through your intestines to slow down.  You may develop hemorrhoids or swollen veins (varicose veins).  Your breasts may begin to grow larger and become tender. Your nipples may stick out more, and the tissue that surrounds them (areola) may become darker.  Your gums may bleed and may be sensitive to brushing and flossing.  Dark spots or blotches (chloasma, mask of pregnancy) may develop on your face. This will likely fade after the baby is born.  Your menstrual periods will stop.  You may have a loss of appetite.  You may develop cravings for certain kinds of food.  You may have changes in your emotions from day to day, such as being excited to be pregnant or being concerned that something may go wrong with the pregnancy and baby.  You may have more vivid and strange dreams.  You may have changes in your hair. These can include thickening of your hair, rapid growth, and changes in texture. Some women also have hair loss during or after pregnancy, or hair that feels dry or thin. Your hair will most likely return to normal after your baby is born. What to expect at prenatal visits During a routine prenatal visit:  You will be weighed to make sure you and the baby are growing normally.  Your blood pressure will be taken.  Your abdomen will be measured to track your baby's growth.  The fetal heartbeat will be listened to between weeks 10 and  14 of your pregnancy.  Test results from any previous visits will be discussed. Your health care  provider may ask you:  How you are feeling.  If you are feeling the baby move.  If you have had any abnormal symptoms, such as leaking fluid, bleeding, severe headaches, or abdominal cramping.  If you are using any tobacco products, including cigarettes, chewing tobacco, and electronic cigarettes.  If you have any questions. Other tests that may be performed during your first trimester include:  Blood tests to find your blood type and to check for the presence of any previous infections. The tests will also be used to check for low iron levels (anemia) and protein on red blood cells (Rh antibodies). Depending on your risk factors, or if you previously had diabetes during pregnancy, you may have tests to check for high blood sugar that affects pregnant women (gestational diabetes).  Urine tests to check for infections, diabetes, or protein in the urine.  An ultrasound to confirm the proper growth and development of the baby.  Fetal screens for spinal cord problems (spina bifida) and Down syndrome.  HIV (human immunodeficiency virus) testing. Routine prenatal testing includes screening for HIV, unless you choose not to have this test.  You may need other tests to make sure you and the baby are doing well. Follow these instructions at home: Medicines  Follow your health care provider's instructions regarding medicine use. Specific medicines may be either safe or unsafe to take during pregnancy.  Take a prenatal vitamin that contains at least 600 micrograms (mcg) of folic acid.  If you develop constipation, try taking a stool softener if your health care provider approves. Eating and drinking   Eat a balanced diet that includes fresh fruits and vegetables, whole grains, good sources of protein such as meat, eggs, or tofu, and low-fat dairy. Your health care provider will help you determine the amount of weight gain that is right for you.  Avoid raw meat and uncooked cheese. These  carry germs that can cause birth defects in the baby.  Eating four or five small meals rather than three large meals a day may help relieve nausea and vomiting. If you start to feel nauseous, eating a few soda crackers can be helpful. Drinking liquids between meals, instead of during meals, also seems to help ease nausea and vomiting.  Limit foods that are high in fat and processed sugars, such as fried and sweet foods.  To prevent constipation: ? Eat foods that are high in fiber, such as fresh fruits and vegetables, whole grains, and beans. ? Drink enough fluid to keep your urine clear or pale yellow. Activity  Exercise only as directed by your health care provider. Most women can continue their usual exercise routine during pregnancy. Try to exercise for 30 minutes at least 5 days a week. Exercising will help you: ? Control your weight. ? Stay in shape. ? Be prepared for labor and delivery.  Experiencing pain or cramping in the lower abdomen or lower back is a good sign that you should stop exercising. Check with your health care provider before continuing with normal exercises.  Try to avoid standing for long periods of time. Move your legs often if you must stand in one place for a long time.  Avoid heavy lifting.  Wear low-heeled shoes and practice good posture.  You may continue to have sex unless your health care provider tells you not to. Relieving pain and discomfort  Wear a good  support bra to relieve breast tenderness.  Take warm sitz baths to soothe any pain or discomfort caused by hemorrhoids. Use hemorrhoid cream if your health care provider approves.  Rest with your legs elevated if you have leg cramps or low back pain.  If you develop varicose veins in your legs, wear support hose. Elevate your feet for 15 minutes, 3-4 times a day. Limit salt in your diet. Prenatal care  Schedule your prenatal visits by the twelfth week of pregnancy. They are usually scheduled  monthly at first, then more often in the last 2 months before delivery.  Write down your questions. Take them to your prenatal visits.  Keep all your prenatal visits as told by your health care provider. This is important. Safety  Wear your seat belt at all times when driving.  Make a list of emergency phone numbers, including numbers for family, friends, the hospital, and police and fire departments. General instructions  Ask your health care provider for a referral to a local prenatal education class. Begin classes no later than the beginning of month 6 of your pregnancy.  Ask for help if you have counseling or nutritional needs during pregnancy. Your health care provider can offer advice or refer you to specialists for help with various needs.  Do not use hot tubs, steam rooms, or saunas.  Do not douche or use tampons or scented sanitary pads.  Do not cross your legs for long periods of time.  Avoid cat litter boxes and soil used by cats. These carry germs that can cause birth defects in the baby and possibly loss of the fetus by miscarriage or stillbirth.  Avoid all smoking, herbs, alcohol, and medicines not prescribed by your health care provider. Chemicals in these products affect the formation and growth of the baby.  Do not use any products that contain nicotine or tobacco, such as cigarettes and e-cigarettes. If you need help quitting, ask your health care provider. You may receive counseling support and other resources to help you quit.  Schedule a dentist appointment. At home, brush your teeth with a soft toothbrush and be gentle when you floss. Contact a health care provider if:  You have dizziness.  You have mild pelvic cramps, pelvic pressure, or nagging pain in the abdominal area.  You have persistent nausea, vomiting, or diarrhea.  You have a bad smelling vaginal discharge.  You have pain when you urinate.  You notice increased swelling in your face, hands,  legs, or ankles.  You are exposed to fifth disease or chickenpox.  You are exposed to [place] measles (rubella) and have never had it. Get help right away if:  You have a fever.  You are leaking fluid from your vagina.  You have spotting or bleeding from your vagina.  You have severe abdominal cramping or pain.  You have rapid weight gain or loss.  You vomit blood or material that looks like coffee grounds.  You develop a severe headache.  You have shortness of breath.  You have any kind of trauma, such as from a fall or a car accident. Summary  The first trimester of pregnancy is from week 1 until the end of week 13 (months 1 through 3).  Your body goes through many changes during pregnancy. The changes vary from woman to woman.  You will have routine prenatal visits. During those visits, your health care provider will examine you, discuss any test results you may have, and talk with you about how  you are feeling. This information is not intended to replace advice given to you by your health care provider. Make sure you discuss any questions you have with your health care provider. Document Revised: 11/15/2017 Document Reviewed: 11/14/2016 Elsevier Patient Education  2020 [employer].

## 2020-04-13 NOTE — Interval H&P Note (Signed)
History and Physical Interval Note:  04/13/2020 12:06 PM  Kathleen Grimes  has presented today for surgery, with the diagnosis of Cholelithiasis.  The various methods of treatment have been discussed with the patient and family. After consideration of risks, benefits and other options for treatment, the patient has consented to  Procedure(s): LAPAROSCOPIC CHOLECYSTECTOMY (N/A) as a surgical intervention.  The patient's history has been reviewed, patient examined, no change in status, stable for surgery.  I have reviewed the patient's chart and labs.  Questions were answered to the patient's satisfaction.    Patient has been symptomatic with RUQ pain and had to go to ED last week. Asked if we could schedule surgery sooner. She is 10 weeks pregnany. No spotting. Has chronic nausea and vomiting.  LFTs have been good and normal X 2 now, and I repeated them again, and do not think we need to do a cholangiogram at this time.  Repeat US with possible polyp in gallbladder and CBD 70mm.   Lucretia Roers

## 2020-04-13 NOTE — Anesthesia Postprocedure Evaluation (Signed)
Anesthesia Post Note  Patient: Kathleen Grimes  Procedure(s) Performed: LAPAROSCOPIC CHOLECYSTECTOMY (N/A )  Patient location during evaluation: PACU Anesthesia Type: General Level of consciousness: awake and alert Pain management: pain level controlled Vital Signs Assessment: post-procedure vital signs reviewed and stable Respiratory status: spontaneous breathing, nonlabored ventilation and respiratory function stable Cardiovascular status: stable Postop Assessment: no apparent nausea or vomiting Anesthetic complications: no     Last Vitals:  Vitals:   04/13/20 1134 04/13/20 1323  BP: 108/67 (!) 136/91  Pulse: 95 (!) 116  Resp: 18 11  Temp: 36.8 C 37.3 C  SpO2: 97% 98%    Last Pain:  Vitals:   04/13/20 1330  TempSrc:   PainSc: 5                  Dara Camargo Hristova

## 2020-04-13 NOTE — Progress Notes (Signed)
[facility]  Patient asked me to speak with her cousin Pennie Rushing after surgery. Mother has no minutes on her phone so unable to call her.  Pennie Rushing was not in the lobby when I called out.   Norco sent to [facility].  Dr. Emelda Fear notified that we had to move up 2 days due to worsening symptoms.   Algis Greenhouse, MD [facility] [address] Vella Raring [place], [place] [address] [phone] (office)

## 2020-04-13 NOTE — Transfer of Care (Signed)
Immediate Anesthesia Transfer of Care Note  Patient: Kathleen Grimes  Procedure(s) Performed: LAPAROSCOPIC CHOLECYSTECTOMY (N/A )  Patient Location: PACU  Anesthesia Type:General  Level of Consciousness: awake, alert  and oriented  Airway & Oxygen Therapy: Patient Spontanous Breathing  Post-op Assessment: Report given to RN and Post -op Vital signs reviewed and stable  Post vital signs: Reviewed and stable  Last Vitals:  Vitals Value Taken Time  BP 129/85 04/13/20 1330  Temp 37.3 C 04/13/20 1323  Pulse 104 04/13/20 1331  Resp 11 04/13/20 1331  SpO2 100 % 04/13/20 1331  Vitals shown include unvalidated device data.  Last Pain:  Vitals:   04/13/20 1330  TempSrc:   PainSc: 5       Patients Stated Pain Goal: 4 (04/13/20 1134)  Complications: No apparent anesthesia complications

## 2020-04-13 NOTE — Op Note (Addendum)
Operative Note   Preoperative Diagnosis: Symptomatic cholelithiasis Postoperative Diagnosis: Same   Procedure(s) Performed: Laparoscopic cholecystectomy   Surgeon: Lillia Abed C. Henreitta Leber, MD   Assistants: Franky Macho, MD No   Anesthesia: General endotracheal   Anesthesiologist: Molli Barrows, MD    Specimens: Gallbladder    Estimated Blood Loss: Minimal    Blood Replacement: None    Complications: None    Operative Findings: Distended gallbladder, minor omental adhesions    Procedure: The patient was taken to the operating room and placed supine. General endotracheal anesthesia was induced. Intravenous antibiotics were administered per protocol. An orogastric tube positioned to decompress the stomach. The abdomen was prepared and draped in the usual sterile fashion.    A supraumbilical incision was made and the open Hassan technique was performed gaining access into the peritoneum with scissors.  Pneumoperitoneum to 15 mmHg with carbon dioxide was achieved.  A 10 mm 0-degree operative laparoscope was introduced. The area underlying the trocar and was inspected and without evidence of injury.  Remaining trocars were placed under direct vision. Two 5 mm ports were placed in the right abdomen, between the anterior axillary and midclavicular line.  A final 11 mm port was placed through the mid-epigastrium, near the falciform ligament.   The gallbladder was distended with some minor omental adhesions which were taken down with electrocautery.    The gallbladder fundus was elevated cephalad and the infundibulum was retracted to the patient's right. The gallbladder/cystic duct junction was skeletonized. The cystic artery noted in the triangle of Calot and was also skeletonized.  We then continued liberal medial and lateral dissection until the critical view of safety was achieved.    The cystic duct was triply clipped and cystic artery was doubly clipped and both were divided. The  gallbladder was then dissected from the liver bed with electrocautery. The specimen was placed in an Endopouch and was retrieved through the epigastric site.   Final inspection revealed acceptable hemostasis. Surgical SNOW was placed in the gallbladder bed.  Trocars were removed and pneumoperitoneum was released.  0 Vicryl fascial sutures were used to close the epigastric and umbilical port sites. Skin incisions were closed with 4-0 Monocryl subcuticular sutures and Dermabond. The patient was awakened from anesthesia and extubated without complication.    Algis Greenhouse, MD [facility] [address] [place], [place] [address] [phone] (office)

## 2020-04-13 NOTE — Anesthesia Procedure Notes (Signed)
Procedure Name: Intubation Date/Time: 04/13/2020 12:24 PM Performed by: Hewitt Blade, CRNA Pre-anesthesia Checklist: Patient identified, Emergency Drugs available, Suction available and Patient being monitored Patient Re-evaluated:Patient Re-evaluated prior to induction Oxygen Delivery Method: Circle system utilized Preoxygenation: Pre-oxygenation with 100% oxygen Induction Type: IV induction Ventilation: Mask ventilation without difficulty Laryngoscope Size: Mac and 3 Grade View: Grade I Tube type: Oral Tube size: 7.0 mm Number of attempts: 1 Airway Equipment and Method: Stylet Placement Confirmation: ETT inserted through vocal cords under direct vision,  positive ETCO2 and breath sounds checked- equal and bilateral Secured at: 21 cm Tube secured with: Tape Dental Injury: Teeth and Oropharynx as per pre-operative assessment

## 2020-04-13 NOTE — Anesthesia Preprocedure Evaluation (Addendum)
Anesthesia Evaluation  Patient identified by MRN, date of birth, ID band Patient awake    Reviewed: Allergy & Precautions, NPO status , Patient's Chart, lab work & pertinent test results  History of Anesthesia Complications Negative for: history of anesthetic complications  Airway Mallampati: I  TM Distance: >3 FB Neck ROM: Full    Dental  (+) Dental Advisory Given   Pulmonary neg pulmonary ROS, Patient abstained from smoking.,    Pulmonary exam normal breath sounds clear to auscultation       Cardiovascular negative cardio ROS Normal cardiovascular exam Rhythm:Regular Rate:Normal     Neuro/Psych negative neurological ROS  negative psych ROS   GI/Hepatic (+)     substance abuse (last use- 04/12/20)  marijuana use, Nausea and vomiting   Endo/Other  negative endocrine ROS  Renal/GU negative Renal ROS  negative genitourinary   Musculoskeletal negative musculoskeletal ROS (+)   Abdominal   Peds negative pediatric ROS (+)  Hematology  (+) anemia ,   Anesthesia Other Findings   Reproductive/Obstetrics (+) Pregnancy                            Anesthesia Physical Anesthesia Plan  ASA: II  Anesthesia Plan: General   Post-op Pain Management:    Induction: Intravenous  PONV Risk Score and Plan: 4 or greater and Ondansetron, Dexamethasone, Metaclopromide and Treatment may vary due to age or medical condition  Airway Management Planned: Oral ETT  Additional Equipment:   Intra-op Plan:   Post-operative Plan: Extubation in OR  Informed Consent: I have reviewed the patients History and Physical, chart, labs and discussed the procedure including the risks, benefits and alternatives for the proposed anesthesia with the patient or authorized representative who has indicated his/her understanding and acceptance.     Dental advisory given  Plan Discussed with: CRNA and  Surgeon  Anesthesia Plan Comments: (Risks of anesthesia during pregnancy was explained, patient agreed to proceed.)       Anesthesia Quick Evaluation

## 2020-04-13 NOTE — Progress Notes (Signed)
[facility]  Spoke with a female cousin in the lobby. All questions answered.  Algis Greenhouse, MD [facility] [address] [place], [place] [address] [phone] (office)

## 2020-04-14 LAB — SURGICAL PATHOLOGY

## 2020-04-15 ENCOUNTER — Encounter: Payer: Self-pay | Admitting: General Surgery

## 2020-04-15 DIAGNOSIS — K8020 Calculus of gallbladder without cholecystitis without obstruction: Secondary | ICD-10-CM | POA: Diagnosis present

## 2020-04-17 ENCOUNTER — Encounter: Payer: Self-pay | Admitting: General Surgery

## 2020-04-18 ENCOUNTER — Inpatient Hospital Stay (HOSPITAL_COMMUNITY)
Admission: EM | Admit: 2020-04-18 | Discharge: 2020-04-20 | DRG: 832 | Disposition: A | Discharge status: Home / Self Care | Payer: Medicaid Other | Attending: General Surgery | Admitting: General Surgery

## 2020-04-18 ENCOUNTER — Encounter (HOSPITAL_COMMUNITY): Payer: Self-pay | Admitting: *Deleted

## 2020-04-18 ENCOUNTER — Other Ambulatory Visit: Payer: Self-pay

## 2020-04-18 DIAGNOSIS — G43D0 Abdominal migraine, not intractable: Secondary | ICD-10-CM | POA: Diagnosis present

## 2020-04-18 DIAGNOSIS — E876 Hypokalemia: Secondary | ICD-10-CM | POA: Diagnosis not present

## 2020-04-18 DIAGNOSIS — R112 Nausea with vomiting, unspecified: Secondary | ICD-10-CM | POA: Diagnosis present

## 2020-04-18 DIAGNOSIS — O210 Mild hyperemesis gravidarum: Principal | ICD-10-CM | POA: Diagnosis present

## 2020-04-18 DIAGNOSIS — Z803 Family history of malignant neoplasm of breast: Secondary | ICD-10-CM

## 2020-04-18 DIAGNOSIS — O26611 Liver and biliary tract disorders in pregnancy, first trimester: Secondary | ICD-10-CM | POA: Diagnosis present

## 2020-04-18 DIAGNOSIS — Z3A11 11 weeks gestation of pregnancy: Secondary | ICD-10-CM

## 2020-04-18 DIAGNOSIS — O99281 Endocrine, nutritional and metabolic diseases complicating pregnancy, first trimester: Secondary | ICD-10-CM | POA: Diagnosis present

## 2020-04-18 DIAGNOSIS — O99351 Diseases of the nervous system complicating pregnancy, first trimester: Secondary | ICD-10-CM | POA: Diagnosis present

## 2020-04-18 DIAGNOSIS — Z20822 Contact with and (suspected) exposure to covid-19: Secondary | ICD-10-CM | POA: Diagnosis present

## 2020-04-18 DIAGNOSIS — O99321 Drug use complicating pregnancy, first trimester: Secondary | ICD-10-CM | POA: Diagnosis present

## 2020-04-18 LAB — CBC WITH DIFFERENTIAL/PLATELET
Abs Immature Granulocytes: 0.02 10*3/uL (ref 0.00–0.07)
Basophils Absolute: 0 10*3/uL (ref 0.0–0.1)
Basophils Relative: 0 %
Eosinophils Absolute: 0.1 10*3/uL (ref 0.0–0.5)
Eosinophils Relative: 1 %
HCT: 37.4 % (ref 36.0–46.0)
Hemoglobin: 13.2 g/dL (ref 12.0–15.0)
Immature Granulocytes: 0 %
Lymphocytes Relative: 12 %
Lymphs Abs: 0.9 10*3/uL (ref 0.7–4.0)
MCH: 32.2 pg (ref 26.0–34.0)
MCHC: 35.3 g/dL (ref 30.0–36.0)
MCV: 91.2 fL (ref 80.0–100.0)
Monocytes Absolute: 0.5 10*3/uL (ref 0.1–1.0)
Monocytes Relative: 6 %
Neutro Abs: 6.3 10*3/uL (ref 1.7–7.7)
Neutrophils Relative %: 81 %
Platelets: 246 10*3/uL (ref 150–400)
RBC: 4.1 MIL/uL (ref 3.87–5.11)
RDW: 12.5 % (ref 11.5–15.5)
WBC: 7.9 10*3/uL (ref 4.0–10.5)
nRBC: 0 % (ref 0.0–0.2)

## 2020-04-18 LAB — URINALYSIS, ROUTINE W REFLEX MICROSCOPIC
Bilirubin Urine: NEGATIVE
Glucose, UA: NEGATIVE mg/dL
Hgb urine dipstick: NEGATIVE
Ketones, ur: 80 mg/dL — AB
Nitrite: NEGATIVE
Protein, ur: 30 mg/dL — AB
Specific Gravity, Urine: 1.019 (ref 1.005–1.030)
pH: 6 (ref 5.0–8.0)

## 2020-04-18 LAB — COMPREHENSIVE METABOLIC PANEL
ALT: 193 U/L — ABNORMAL HIGH (ref 0–44)
AST: 58 U/L — ABNORMAL HIGH (ref 15–41)
Albumin: 3.9 g/dL (ref 3.5–5.0)
Alkaline Phosphatase: 127 U/L — ABNORMAL HIGH (ref 38–126)
Anion gap: 14 (ref 5–15)
BUN: 7 mg/dL (ref 6–20)
CO2: 21 mmol/L — ABNORMAL LOW (ref 22–32)
Calcium: 9.4 mg/dL (ref 8.9–10.3)
Chloride: 99 mmol/L (ref 98–111)
Creatinine, Ser: 0.45 mg/dL (ref 0.44–1.00)
GFR calc Af Amer: >60 mL/min (ref >60)
GFR calc non Af Amer: >60 mL/min (ref >60)
Glucose, Bld: 91 mg/dL (ref 70–99)
Potassium: 3.1 mmol/L — ABNORMAL LOW (ref 3.5–5.1)
Sodium: 134 mmol/L — ABNORMAL LOW (ref 135–145)
Total Bilirubin: 1.7 mg/dL — ABNORMAL HIGH (ref 0.3–1.2)
Total Protein: 7.1 g/dL (ref 6.5–8.1)

## 2020-04-18 LAB — HCG, QUANTITATIVE, PREGNANCY: hCG, Beta Chain, Quant, S: 91109 m[IU]/mL — ABNORMAL HIGH (ref <5)

## 2020-04-18 LAB — RAPID URINE DRUG SCREEN, HOSP PERFORMED
Amphetamines: NOT DETECTED
Barbiturates: NOT DETECTED
Benzodiazepines: NOT DETECTED
Cocaine: NOT DETECTED
Opiates: NOT DETECTED
Tetrahydrocannabinol: POSITIVE — AB

## 2020-04-18 LAB — RESPIRATORY PANEL BY RT PCR (FLU A&B, COVID)
Influenza A by PCR: NEGATIVE
Influenza B by PCR: NEGATIVE
SARS Coronavirus 2 by RT PCR: NEGATIVE

## 2020-04-18 LAB — LIPASE, BLOOD: Lipase: 16 U/L (ref 11–51)

## 2020-04-18 LAB — HIV ANTIBODY (ROUTINE TESTING W REFLEX): HIV Screen 4th Generation wRfx: NONREACTIVE

## 2020-04-18 MED ORDER — DIPHENHYDRAMINE HCL 12.5 MG/5ML PO ELIX: 12.5000 mg | ORAL_SOLUTION | Freq: Four times a day (QID) | ORAL | Status: DC | PRN

## 2020-04-18 MED ORDER — ONDANSETRON HCL 4 MG/2ML IJ SOLN
4.0000 mg | Freq: Four times a day (QID) | INTRAMUSCULAR | Status: DC | PRN
  Administered 2020-04-18 (×2): 4 mg via INTRAVENOUS
  Filled 2020-04-18 (×2): qty 2

## 2020-04-18 MED ORDER — METOCLOPRAMIDE HCL 5 MG/ML IJ SOLN
10.0000 mg | Freq: Once | INTRAMUSCULAR | Status: AC
  Administered 2020-04-18: 10 mg via INTRAVENOUS
  Filled 2020-04-18: qty 2

## 2020-04-18 MED ORDER — ACETAMINOPHEN 325 MG PO TABS: 650.0000 mg | ORAL_TABLET | Freq: Four times a day (QID) | ORAL | Status: DC | PRN

## 2020-04-18 MED ORDER — DIPHENHYDRAMINE HCL 50 MG/ML IJ SOLN: 12.5000 mg | Freq: Four times a day (QID) | INTRAMUSCULAR | Status: DC | PRN

## 2020-04-18 MED ORDER — DOCUSATE SODIUM 100 MG PO CAPS
100.0000 mg | ORAL_CAPSULE | Freq: Two times a day (BID) | ORAL | Status: DC
  Administered 2020-04-18 – 2020-04-20 (×4): 100 mg via ORAL
  Filled 2020-04-18 (×4): qty 1

## 2020-04-18 MED ORDER — LACTATED RINGERS IV SOLN: INTRAVENOUS | Status: DC

## 2020-04-18 MED ORDER — METOCLOPRAMIDE HCL 10 MG PO TABS
10.0000 mg | ORAL_TABLET | Freq: Four times a day (QID) | ORAL | Status: DC
  Administered 2020-04-18 – 2020-04-20 (×6): 10 mg via ORAL
  Filled 2020-04-18 (×8): qty 1

## 2020-04-18 MED ORDER — PROMETHAZINE HCL 25 MG/ML IJ SOLN
6.2500 mg | Freq: Four times a day (QID) | INTRAMUSCULAR | Status: DC | PRN
  Administered 2020-04-18 (×2): 6.25 mg via INTRAVENOUS
  Filled 2020-04-18 (×2): qty 1

## 2020-04-18 MED ORDER — OXYCODONE HCL 5 MG PO TABS
5.0000 mg | ORAL_TABLET | ORAL | Status: DC | PRN
  Administered 2020-04-18: 5 mg via ORAL
  Filled 2020-04-18: qty 1

## 2020-04-18 MED ORDER — LACTATED RINGERS IV BOLUS
1000.0000 mL | Freq: Once | INTRAVENOUS | Status: AC
  Administered 2020-04-18: 1000 mL via INTRAVENOUS

## 2020-04-18 MED ORDER — PRENATAL MULTIVITAMIN CH
1.0000 | ORAL_TABLET | Freq: Every day | ORAL | Status: DC
  Filled 2020-04-18 (×6): qty 1

## 2020-04-18 MED ORDER — ACETAMINOPHEN 650 MG RE SUPP: 650.0000 mg | Freq: Four times a day (QID) | RECTAL | Status: DC | PRN

## 2020-04-18 MED ORDER — DICYCLOMINE HCL 10 MG PO CAPS
10.0000 mg | ORAL_CAPSULE | Freq: Once | ORAL | Status: AC
  Administered 2020-04-18: 10 mg via ORAL
  Filled 2020-04-18: qty 1

## 2020-04-18 MED ORDER — FENTANYL CITRATE (PF) 100 MCG/2ML IJ SOLN
50.0000 ug | Freq: Once | INTRAMUSCULAR | Status: AC
  Administered 2020-04-18: 50 ug via INTRAVENOUS
  Filled 2020-04-18: qty 2

## 2020-04-18 NOTE — ED Notes (Signed)
Pt requesting something for nausea, Dr. Henreitta Leber informed and orders given and carried out; pt given cool washcloth to help ease the nausea

## 2020-04-18 NOTE — ED Provider Notes (Signed)
[facility] EMERGENCY DEPARTMENT Provider Note   CSN: [id number] Arrival date & time: 04/18/20  0531     History Chief Complaint  Patient presents with  . Emesis    Kathleen Grimes is a 19 y.o. female.  Recent laparoscopic cholecystectomy and recently found out she was pregnant she is approximately 11 weeks at this time.  She comes in with nonbloody nonbilious emesis.  Patient states she was discharged from the hospital on hydrocodone which made her feel ill so she stopped those a couple days ago and was feeling pretty good for couple days but then her last 24 to 36 hours she states that she started feeling bad again with multiple episodes of emesis.  She did not try the at home because she states that it does not work.  She states that she has some pain generalized abdominal pain but nothing that is focal.  She does not have any pain around her wound.  No drainage from her wounds.  No vaginal bleeding or discharge or leakage of fluid.  No history of being pregnant in the past.   Emesis  Past Medical History:  Diagnosis Date  . Abdominal migraine   . Allergic rhinitis 03/13/2013  . Allergy   . Anemia     Patient Active Problem List   Diagnosis Date Noted  . Calculus of gallbladder without cholecystitis without obstruction 04/06/2020  . Abdominal migraine 02/22/2015  . 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 Allergic rhinitis 03/13/2013    Past Surgical History:  Procedure Laterality Date  . CHOLECYSTECTOMY N/A 04/13/2020   Procedure: LAPAROSCOPIC CHOLECYSTECTOMY;  Surgeon: Lucretia Roers, MD;  Location: [facility];  Service: General;  Laterality: N/A;  . WISDOM TOOTH EXTRACTION       OB History    Gravida  1   Para      Term      Preterm      AB      Living        SAB      TAB      Ectopic      Multiple      Live Births              Family History  Problem Relation Age of Onset  . Breast cancer Paternal Grandmother   . Cancer Maternal Great-grandmother         bone marrow  . Colon cancer Neg Hx   . Esophageal cancer Neg Hx   . Stomach cancer Neg Hx     Social History   Tobacco Use  . Smoking status: Never Smoker  . Smokeless tobacco: Never Used  Substance Use Topics  . Alcohol use: No  . Drug use: Yes    Types: Marijuana    Comment: 2 grams    Home Medications Prior to Admission medications   Medication Sig Start Date End Date Taking? Authorizing Provider  docusate sodium (COLACE) 100 MG capsule Take 1 capsule (100 mg total) by mouth 2 (two) times daily as needed (constipation related to narcotic use). 04/13/20 04/13/21  Lucretia Roers, MD  HYDROcodone-acetaminophen (NORCO/VICODIN) 5-325 MG tablet Take 1 tablet by mouth every 4 (four) hours as needed for severe pain. 04/13/20 04/13/21  Lucretia Roers, MD  metoCLOPramide (REGLAN) 10 MG tablet Take 1 tablet (10 mg total) by mouth every 6 (six) hours. 04/06/20   Mannie Stabile, PA-C  Prenatal Vit-Fe Fumarate-FA (PRENATAL MULTIVITAMIN) TABS tablet Take 2 tablets by mouth daily at 12  noon. Gummies    [provider]    Allergies    Patient has no known allergies.  Review of Systems   Review of Systems  Gastrointestinal: Positive for vomiting.  All other systems reviewed and are negative.   Physical Exam Updated Vital Signs BP 117/85   Pulse 95   Temp 97.6 F (36.4 C) (Oral)   Resp 20   Ht 5\' 7"  (1.702 m)   Wt 60.7 kg   LMP 02/07/2020   SpO2 100%   BMI 20.96 kg/m   Physical Exam Vitals and nursing note reviewed.  Constitutional:      Appearance: She is well-developed.  HENT:     Head: Normocephalic and atraumatic.     Nose: No congestion or rhinorrhea.     Mouth/Throat:     Mouth: Mucous membranes are dry.     Pharynx: Oropharynx is clear.  Cardiovascular:     Rate and Rhythm: Normal rate and regular rhythm.  Pulmonary:     Effort: No respiratory distress.     Breath sounds: No stridor.  Abdominal:     General: There is no distension.      Palpations: There is no mass.     Tenderness: There is no abdominal tenderness. There is no guarding or rebound.  Musculoskeletal:        General: No swelling or tenderness. Normal range of motion.     Cervical back: Normal range of motion.  Skin:    General: Skin is warm and dry.     Coloration: Skin is not jaundiced.     Comments: Laparoscopic wounds c/d/i with glue in place.   Neurological:     General: No focal deficit present.     Mental Status: She is alert.     Cranial Nerves: No cranial nerve deficit.     ED Results / Procedures / Treatments   Labs (all labs ordered are listed, but only abnormal results are displayed) Labs Reviewed  CBC WITH DIFFERENTIAL/PLATELET  COMPREHENSIVE METABOLIC PANEL  LIPASE, BLOOD  URINALYSIS, ROUTINE W REFLEX MICROSCOPIC  HCG, QUANTITATIVE, PREGNANCY    EKG None  Radiology No results found.  Procedures Procedures (including critical care time)  Medications Ordered in ED Medications  lactated ringers bolus 1,000 mL (1,000 mLs Intravenous New Bag/Given (Non-Interop) 04/18/20 0641)  metoCLOPramide (REGLAN) injection 10 mg (10 mg Intravenous Given 04/18/20 0640)  fentaNYL (SUBLIMAZE) injection 50 mcg (50 mcg Intravenous Given 04/18/20 0640)    ED Course  I have reviewed the triage vital signs and the nursing notes.  Pertinent labs & imaging results that were available during my care of the patient were reviewed by me and considered in my medical decision making (see chart for details).    MDM Rules/Calculators/A&P                      Emesis and abdominal cramping. Recent chole but no obvious signs of complications. Abdomen benign, doubt surgical issue currently. Will check basic labs, urine, give symptomatic treatment.   Labs w/ elevated liver enzymes and bilirubin. Still with vomiting, mild pain.   Discussed with Dr. Constance Haw who will see patient for likely observation.   Final Clinical Impression(s) / ED Diagnoses Final  diagnoses:  None    Rx / DC Orders ED Discharge Orders    None       Garik Diamant, Corene Cornea, MD 04/19/20 [phone]

## 2020-04-18 NOTE — ED Notes (Signed)
Attempted report x1. 

## 2020-04-18 NOTE — ED Triage Notes (Signed)
Pt c/o abd pain and vomiting that has gotten worse since having gallbladder surgery last week,

## 2020-04-18 NOTE — H&P (Addendum)
[facility] History and Physical   Chief Complaint    Emesis      Kathleen Grimes is a 19 y.o. female.  HPI: Kathleen Grimes is a 19 yo 10 weeks 5 days pregnant per her report with a history of chronic nausea/vomiting from abdominal migraine/ marijuana use who had symptomatic cholelithiasis and elevated LFTs in the past few weeks that prompted multiple visits to the ED.  At one point her bilirubin was as high as 5, and this trended down with repeat imaging showing no stones. I did LFTs the day of surgery to ensure no changes to avoid cholangiogram at that time.  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  She reports having a few good days then developing nausea and vomiting post operatively and has been unable to hold anything down per her report. She came to the ED today due to this concerns. In the Ed she had some minor abdominal pain but only intermittent. LFTs were done and were elevated. Given her post operative state and nausea/vomiting, she is being admitted for observation, hydration, and to trend her labs.   Currently she has no pain and her nausea is controlled with zofran.     Past Medical History:  Diagnosis Date  . Abdominal migraine   . Allergic rhinitis 03/13/2013  . Allergy   . Anemia     Past Surgical History:  Procedure Laterality Date  . CHOLECYSTECTOMY N/A 04/13/2020   Procedure: LAPAROSCOPIC CHOLECYSTECTOMY;  Surgeon: Lucretia Roers, MD;  Location: [facility];  Service: General;  Laterality: N/A;  . WISDOM TOOTH EXTRACTION      Family History  Problem Relation Age of Onset  . Breast cancer Paternal Grandmother   . Cancer Maternal Great-grandmother        bone marrow  . Colon cancer Neg Hx   . Esophageal cancer Neg Hx   . Stomach cancer Neg Hx     Social History   Tobacco Use  . Smoking status: Never Smoker  . Smokeless tobacco: Never Used   Substance Use Topics  . Alcohol use: No  . Drug use: Yes    Types: Marijuana    Comment: 2 grams    Medications:  Current Facility-Administered Medications  Medication Dose Route Frequency Provider Last Rate Last Admin  . acetaminophen (TYLENOL) tablet 650 mg  650 mg Oral Q6H PRN Lucretia Roers, MD       Or  . acetaminophen (TYLENOL) suppository 650 mg  650 mg Rectal Q6H PRN Lucretia Roers, MD      . diphenhydrAMINE (BENADRYL) 12.5 MG/5ML elixir 12.5 mg  12.5 mg Oral Q6H PRN Lucretia Roers, MD       Or  . diphenhydrAMINE (BENADRYL) injection 12.5 mg  12.5 mg Intravenous Q6H PRN Lucretia Roers, MD      . docusate sodium (COLACE) capsule 100 mg  100 mg Oral BID Lucretia Roers, MD      . lactated ringers infusion   Intravenous Continuous Lucretia Roers, MD      . metoCLOPramide (REGLAN) tablet 10 mg  10 mg Oral Q6H Lucretia Roers, MD   10 mg at 04/18/20 0800  . ondansetron (ZOFRAN) injection 4 mg  4 mg Intravenous Q6H PRN Lucretia Roers, MD      . oxyCODONE (Oxy IR/ROXICODONE) immediate release tablet 5 mg  5 mg Oral Q4H PRN Algis Greenhouse  C, MD      . prenatal multivitamin tablet 2 tablet  2 tablet Oral Q1200 Lucretia Roers, MD      . promethazine (PHENERGAN) injection 6.25 mg  6.25 mg Intravenous Q6H PRN Lucretia Roers, MD       Current Outpatient Medications  Medication Sig Dispense Refill Last Dose  . docusate sodium (COLACE) 100 MG capsule Take 1 capsule (100 mg total) by mouth 2 (two) times daily as needed (constipation related to narcotic use). 60 capsule 0 04/17/2020 at Unknown time  . HYDROcodone-acetaminophen (NORCO/VICODIN) 5-325 MG tablet Take 1 tablet by mouth every 4 (four) hours as needed for severe pain. 15 tablet 0 04/17/2020 at Unknown time  . metoCLOPramide (REGLAN) 10 MG tablet Take 1 tablet (10 mg total) by mouth every 6 (six) hours. 30 tablet 0 04/17/2020 at Unknown time  . Prenatal Vit-Fe Fumarate-FA (PRENATAL MULTIVITAMIN) TABS  tablet Take 2 tablets by mouth daily at 12 noon. Gummies   04/17/2020 at Unknown time   No Known Allergies    ROS:  A comprehensive review of systems was negative except for: Gastrointestinal: positive for nausea and vomiting No spotting or cramping   Blood pressure 117/85, pulse 95, temperature 97.6 F (36.4 C), temperature source Oral, resp. rate 20, height 5\' 7"  (1.702 m), weight 60.7 kg, last menstrual period 02/07/2020, SpO2 100 %. Physical Exam Vitals reviewed.  Constitutional:      Appearance: She is normal weight.  HENT:     Head: Normocephalic and atraumatic.     Nose: Nose normal.     Mouth/Throat:     Mouth: Mucous membranes are moist.  Eyes:     Extraocular Movements: Extraocular movements intact.     Pupils: Pupils are equal, round, and reactive to light.  Cardiovascular:     Rate and Rhythm: Normal rate.  Pulmonary:     Effort: Pulmonary effort is normal.  Abdominal:     General: There is no distension.     Palpations: Abdomen is soft.     Tenderness: There is no abdominal tenderness.     Comments: Port sites healing, dermabond in place, no erythema or drainage, no lower abdominal tenderness  Musculoskeletal:        General: No swelling. Normal range of motion.     Cervical back: Normal range of motion.  Skin:    General: Skin is warm.  Neurological:     General: No focal deficit present.     Mental Status: She is alert and oriented to person, place, and time.  Psychiatric:        Mood and Affect: Mood normal.        Behavior: Behavior normal.        Thought Content: Thought content normal.        Judgment: Judgment normal.     Results: Results for orders placed or performed during the hospital encounter of 04/18/20 (from the past 48 hour(s))  Urinalysis, Routine w reflex microscopic     Status: Abnormal   Collection Time: 04/18/20  6:16 AM  Result Value Ref Range   Color, Urine YELLOW YELLOW   APPearance HAZY (A) CLEAR   Specific Gravity, Urine  1.019 1.005 - 1.030   pH 6.0 5.0 - 8.0   Glucose, UA NEGATIVE NEGATIVE mg/dL   Hgb urine dipstick NEGATIVE NEGATIVE   Bilirubin Urine NEGATIVE NEGATIVE   Ketones, ur 80 (A) NEGATIVE mg/dL   Protein, ur 30 (A) NEGATIVE mg/dL   Nitrite NEGATIVE  NEGATIVE   Leukocytes,Ua SMALL (A) NEGATIVE   RBC / HPF 0-5 0 - 5 RBC/hpf   WBC, UA 6-10 0 - 5 WBC/hpf   Bacteria, UA RARE (A) NONE SEEN   Squamous Epithelial / LPF 0-5 0 - 5   Mucus PRESENT     Comment: Performed at [facility], [address], [place], [place] [address]  CBC with Differential     Status: None   Collection Time: 04/18/20  6:37 AM  Result Value Ref Range   WBC 7.9 4.0 - 10.5 K/uL   RBC 4.10 3.87 - 5.11 MIL/uL   Hemoglobin 13.2 12.0 - 15.0 g/dL   HCT 37.4 36.0 - 46.0 %   MCV 91.2 80.0 - 100.0 fL   MCH 32.2 26.0 - 34.0 pg   MCHC 35.3 30.0 - 36.0 g/dL   RDW 12.5 11.5 - 15.5 %   Platelets 246 150 - 400 K/uL   nRBC 0.0 0.0 - 0.2 %   Neutrophils Relative % 81 %   Neutro Abs 6.3 1.7 - 7.7 K/uL   Lymphocytes Relative 12 %   Lymphs Abs 0.9 0.7 - 4.0 K/uL   Monocytes Relative 6 %   Monocytes Absolute 0.5 0.1 - 1.0 K/uL   Eosinophils Relative 1 %   Eosinophils Absolute 0.1 0.0 - 0.5 K/uL   Basophils Relative 0 %   Basophils Absolute 0.0 0.0 - 0.1 K/uL   Immature Granulocytes 0 %   Abs Immature Granulocytes 0.02 0.00 - 0.07 K/uL    Comment: Performed at [facility], [address] [place], [place] [address]  Comprehensive metabolic panel     Status: Abnormal   Collection Time: 04/18/20  6:37 AM  Result Value Ref Range   Sodium 134 (L) 135 - 145 mmol/L   Potassium 3.1 (L) 3.5 - 5.1 mmol/L   Chloride 99 98 - 111 mmol/L   CO2 21 (L) 22 - 32 mmol/L   Glucose, Bld 91 70 - 99 mg/dL    Comment: Glucose reference range applies only to samples taken after fasting for at least 8 hours.   BUN 7 6 - 20 mg/dL   Creatinine, Ser 0.45 0.44 - 1.00 mg/dL   Calcium 9.4 8.9 - 10.3 mg/dL   Total Protein 7.1 6.5 - 8.1 g/dL   Albumin 3.9 3.5  - 5.0 g/dL   AST 58 (H) 15 - 41 U/L   ALT 193 (H) 0 - 44 U/L   Alkaline Phosphatase 127 (H) 38 - 126 U/L   Total Bilirubin 1.7 (H) 0.3 - 1.2 mg/dL   GFR calc non Af Amer >60 >60 mL/min   GFR calc Af Amer >60 >60 mL/min   Anion gap 14 5 - 15    Comment: Performed at [facility], [address] [place], [place] [address]  Lipase, blood     Status: None   Collection Time: 04/18/20  6:37 AM  Result Value Ref Range   Lipase 16 11 - 51 U/L    Comment: Performed at [facility], [address], [place], [place] [address]  hCG, quantitative, pregnancy     Status: Abnormal   Collection Time: 04/18/20  6:37 AM  Result Value Ref Range   hCG, Beta Chain, Quant, S 91,109 (H) <5 mIU/mL    Comment:          GEST. AGE      CONC.  (mIU/mL)   <=1 WEEK        5 - 50  2 WEEKS       50 - 500     3 WEEKS       100 - 10,000     4 WEEKS     1,000 - 30,000     5 WEEKS     3,500 - 115,000   6-8 WEEKS     12,000 - 270,000    12 WEEKS     15,000 - 220,000        FEMALE AND NON-PREGNANT FEMALE:     LESS THAN 5 mIU/mL Performed at [facility], [address] [place], [place] [address]   Rapid urine drug screen (hospital performed)     Status: Abnormal   Collection Time: 04/18/20  6:51 AM  Result Value Ref Range   Opiates NONE DETECTED NONE DETECTED   Cocaine NONE DETECTED NONE DETECTED   Benzodiazepines NONE DETECTED NONE DETECTED   Amphetamines NONE DETECTED NONE DETECTED   Tetrahydrocannabinol POSITIVE (A) NONE DETECTED   Barbiturates NONE DETECTED NONE DETECTED    Comment: (NOTE) DRUG SCREEN FOR MEDICAL PURPOSES ONLY.  IF CONFIRMATION IS NEEDED FOR ANY PURPOSE, NOTIFY LAB WITHIN 5 DAYS. LOWEST DETECTABLE LIMITS FOR URINE DRUG SCREEN Drug Class                     Cutoff (ng/mL) Amphetamine and metabolites    1000 Barbiturate and metabolites    200 Benzodiazepine                 200 Tricyclics and metabolites     300 Opiates and metabolites        300 Cocaine and metabolites         300 THC                            50 Performed at [facility], [address] [place], [place] [address]      Assessment & Plan:  Kathleen Grimes is a 18 y.o. female with elevated LFTs in the setting of nausea/vomiting. She is post op from a laparoscopic cholecystectomy 04/13/2020. She has issues with chronic nausea/vomiting and could also have contributing symptoms from her pregnancy and her post operative state.   -Observation  -Tylenol and Roxicodone PRN for pain, Norco made her ill  -IS, OOB -HD ok -Regular diet, NPO at midnight pending need for imaging -Nausea -Zofran PRN, Phenergan for refractory  -Labs in AM -LR bolus in Ed and @ 100 now -10 weeks 5 days pregnant, have notified Dr. Emelda Fear and Dr. Despina Hidden of patient's admission, do not think need formal consult at this time, no spotting and no cramping pain -SCDs -COVID test pending -Positive for THC on urine Tox   All questions were answered to the satisfaction of the patient.   Lucretia Roers 04/18/2020, 9:02 AM

## 2020-04-18 NOTE — ED Notes (Signed)
Pt intrusted to call when she needs to void

## 2020-04-19 DIAGNOSIS — G43D0 Abdominal migraine, not intractable: Secondary | ICD-10-CM | POA: Diagnosis present

## 2020-04-19 DIAGNOSIS — Z20822 Contact with and (suspected) exposure to covid-19: Secondary | ICD-10-CM | POA: Diagnosis present

## 2020-04-19 DIAGNOSIS — E876 Hypokalemia: Secondary | ICD-10-CM | POA: Diagnosis not present

## 2020-04-19 DIAGNOSIS — O99351 Diseases of the nervous system complicating pregnancy, first trimester: Secondary | ICD-10-CM | POA: Diagnosis present

## 2020-04-19 DIAGNOSIS — O99111 Other diseases of the blood and blood-forming organs and certain disorders involving the immune mechanism complicating pregnancy, first trimester: Secondary | ICD-10-CM | POA: Diagnosis not present

## 2020-04-19 DIAGNOSIS — O99281 Endocrine, nutritional and metabolic diseases complicating pregnancy, first trimester: Secondary | ICD-10-CM | POA: Diagnosis present

## 2020-04-19 DIAGNOSIS — D72829 Elevated white blood cell count, unspecified: Secondary | ICD-10-CM | POA: Diagnosis not present

## 2020-04-19 DIAGNOSIS — O210 Mild hyperemesis gravidarum: Secondary | ICD-10-CM | POA: Diagnosis not present

## 2020-04-19 DIAGNOSIS — O26611 Liver and biliary tract disorders in pregnancy, first trimester: Secondary | ICD-10-CM | POA: Diagnosis present

## 2020-04-19 DIAGNOSIS — R1110 Vomiting, unspecified: Secondary | ICD-10-CM | POA: Diagnosis not present

## 2020-04-19 DIAGNOSIS — Z3A11 11 weeks gestation of pregnancy: Secondary | ICD-10-CM | POA: Diagnosis not present

## 2020-04-19 DIAGNOSIS — O219 Vomiting of pregnancy, unspecified: Secondary | ICD-10-CM | POA: Diagnosis not present

## 2020-04-19 DIAGNOSIS — O99321 Drug use complicating pregnancy, first trimester: Secondary | ICD-10-CM | POA: Diagnosis present

## 2020-04-19 DIAGNOSIS — Z803 Family history of malignant neoplasm of breast: Secondary | ICD-10-CM | POA: Diagnosis not present

## 2020-04-19 LAB — CBC WITH DIFFERENTIAL/PLATELET
Abs Immature Granulocytes: 0.02 10*3/uL (ref 0.00–0.07)
Basophils Absolute: 0 10*3/uL (ref 0.0–0.1)
Basophils Relative: 0 %
Eosinophils Absolute: 0.1 10*3/uL (ref 0.0–0.5)
Eosinophils Relative: 2 %
HCT: 30.1 % — ABNORMAL LOW (ref 36.0–46.0)
Hemoglobin: 10.4 g/dL — ABNORMAL LOW (ref 12.0–15.0)
Immature Granulocytes: 0 %
Lymphocytes Relative: 28 %
Lymphs Abs: 1.6 10*3/uL (ref 0.7–4.0)
MCH: 32.4 pg (ref 26.0–34.0)
MCHC: 34.6 g/dL (ref 30.0–36.0)
MCV: 93.8 fL (ref 80.0–100.0)
Monocytes Absolute: 0.5 10*3/uL (ref 0.1–1.0)
Monocytes Relative: 9 %
Neutro Abs: 3.4 10*3/uL (ref 1.7–7.7)
Neutrophils Relative %: 61 %
Platelets: 196 10*3/uL (ref 150–400)
RBC: 3.21 MIL/uL — ABNORMAL LOW (ref 3.87–5.11)
RDW: 12.6 % (ref 11.5–15.5)
WBC: 5.7 10*3/uL (ref 4.0–10.5)
nRBC: 0 % (ref 0.0–0.2)

## 2020-04-19 LAB — COMPREHENSIVE METABOLIC PANEL
ALT: 141 U/L — ABNORMAL HIGH (ref 0–44)
AST: 62 U/L — ABNORMAL HIGH (ref 15–41)
Albumin: 2.9 g/dL — ABNORMAL LOW (ref 3.5–5.0)
Alkaline Phosphatase: 98 U/L (ref 38–126)
Anion gap: 9 (ref 5–15)
BUN: 5 mg/dL — ABNORMAL LOW (ref 6–20)
CO2: 24 mmol/L (ref 22–32)
Calcium: 8.4 mg/dL — ABNORMAL LOW (ref 8.9–10.3)
Chloride: 103 mmol/L (ref 98–111)
Creatinine, Ser: 0.43 mg/dL — ABNORMAL LOW (ref 0.44–1.00)
GFR calc Af Amer: >60 mL/min (ref >60)
GFR calc non Af Amer: >60 mL/min (ref >60)
Glucose, Bld: 84 mg/dL (ref 70–99)
Potassium: 3.1 mmol/L — ABNORMAL LOW (ref 3.5–5.1)
Sodium: 136 mmol/L (ref 135–145)
Total Bilirubin: 1.4 mg/dL — ABNORMAL HIGH (ref 0.3–1.2)
Total Protein: 5.4 g/dL — ABNORMAL LOW (ref 6.5–8.1)

## 2020-04-19 MED ORDER — METHYLPREDNISOLONE 4 MG PO TABS: 4.0000 mg | ORAL_TABLET | Freq: Every day | ORAL | Status: DC

## 2020-04-19 MED ORDER — METHYLPREDNISOLONE 4 MG PO TABS: 8.0000 mg | ORAL_TABLET | Freq: Every day | ORAL | Status: DC

## 2020-04-19 MED ORDER — ONDANSETRON HCL 4 MG PO TABS
4.0000 mg | ORAL_TABLET | Freq: Three times a day (TID) | ORAL | Status: DC
  Administered 2020-04-19 – 2020-04-20 (×5): 4 mg via ORAL
  Filled 2020-04-19 (×6): qty 1

## 2020-04-19 MED ORDER — METHYLPREDNISOLONE 4 MG PO TABS: 16.0000 mg | ORAL_TABLET | Freq: Every day | ORAL | Status: DC

## 2020-04-19 MED ORDER — METHYLPREDNISOLONE SODIUM SUCC 125 MG IJ SOLR
48.0000 mg | Freq: Once | INTRAMUSCULAR | Status: AC
  Administered 2020-04-19: 48 mg via INTRAVENOUS
  Filled 2020-04-19: qty 2

## 2020-04-19 MED ORDER — METHYLPREDNISOLONE 4 MG PO TABS
16.0000 mg | ORAL_TABLET | Freq: Every day | ORAL | Status: DC
  Administered 2020-04-20: 16 mg via ORAL
  Filled 2020-04-19: qty 4

## 2020-04-19 NOTE — Consult Note (Signed)
FACULTY PRACTICE ANTEPARTUM(COMPREHENSIVE) NOTE / Consult  Kathleen Grimes is a 19 y.o. G1P0 at [redacted]w[redacted]d by LMP who is admitted for nausea and vomiting , now 6 days s/p Laparoscopic Cholecystectomy for suspected symptomatic cholelithiasis. She had surgical findings of a distended gallbladder and minor omental adhesions.   History notable for a chronic history of abdominal migraines and cyclic nausea /vomiting who is seen by [facility]. She recently started having RUQ pain and worsening symptoms 4/22. . She says that the pain for about 2 weeks prior to seeing GI. She had been seen in the ED at [facility] and found to have elevated LFTs and [name] with no biliary dilation or stone. She was found to have gallbladder sludge and possible polyp. The ED visit does not really describe any RUQ pain but the patient reports that this was going on in addition to a worsening of her chronic nausea and vomiting.   Her LFTs were repeated with down trending bilirubin after her ED visit. T bilirubin of 5 trending down to 1.8. She says over the last week she has actually felt better. Her pain has been improved and the additional nausea/vomiting she felt on top of her chronic seems better.  She saw Selinda Orion of [facility] 4/14 for her abdominal pain, nausea, and vomiting, had a 1 yr history of Abdominal Migraines and cyclic vomiting syndrome. Please see the note in the chart. . Length of Stay:  1  Days  Subjective: Pt improving , took PO today, feels zofran is working. No bm since Thursday, but + flatus.   Vitals:  Blood pressure 99/61, pulse 63, temperature 98.4 F (36.9 C), temperature source Oral, resp. rate 20, height 5\' 7"  (1.702 m), weight 60.7 kg, last menstrual period 02/07/2020, SpO2 99 %. Physical Examination:  General appearance - alert, well appearing, and in no distress, oriented to person, place, and time, well hydrated and appears fatigued Heart - normal rate and regular rhythm Abdomen - soft, nontender,  nondistended well healing surgical scars, no erythema Extremities: extremities normal, atraumatic, no cyanosis or edema and Homans sign is negative, no sign of DVT with DTRs 2+ bilaterally   Fetal Monitoring:  none  Labs:  Results for orders placed or performed during the hospital encounter of 04/18/20 (from the past 24 hour(s))  Comprehensive metabolic panel   Collection Time: 04/19/20  6:05 AM  Result Value Ref Range   Sodium 136 135 - 145 mmol/L   Potassium 3.1 (L) 3.5 - 5.1 mmol/L   Chloride 103 98 - 111 mmol/L   CO2 24 22 - 32 mmol/L   Glucose, Bld 84 70 - 99 mg/dL   BUN 5 (L) 6 - 20 mg/dL   Creatinine, Ser 06/19/20 (L) 0.44 - 1.00 mg/dL   Calcium 8.4 (L) 8.9 - 10.3 mg/dL   Total Protein 5.4 (L) 6.5 - 8.1 g/dL   Albumin 2.9 (L) 3.5 - 5.0 g/dL   AST 62 (H) 15 - 41 U/L   ALT 141 (H) 0 - 44 U/L   Alkaline Phosphatase 98 38 - 126 U/L   Total Bilirubin 1.4 (H) 0.3 - 1.2 mg/dL   GFR calc non Af Amer >60 >60 mL/min   GFR calc Af Amer >60 >60 mL/min   Anion gap 9 5 - 15  CBC with Differential/Platelet   Collection Time: 04/19/20  6:05 AM  Result Value Ref Range   WBC 5.7 4.0 - 10.5 K/uL   RBC 3.21 (L) 3.87 - 5.11 MIL/uL  Hemoglobin 10.4 (L) 12.0 - 15.0 g/dL   HCT 30.1 (L) 36.0 - 46.0 %   MCV 93.8 80.0 - 100.0 fL   MCH 32.4 26.0 - 34.0 pg   MCHC 34.6 30.0 - 36.0 g/dL   RDW 12.6 11.5 - 15.5 %   Platelets 196 150 - 400 K/uL   nRBC 0.0 0.0 - 0.2 %   Neutrophils Relative % 61 %   Neutro Abs 3.4 1.7 - 7.7 K/uL   Lymphocytes Relative 28 %   Lymphs Abs 1.6 0.7 - 4.0 K/uL   Monocytes Relative 9 %   Monocytes Absolute 0.5 0.1 - 1.0 K/uL   Eosinophils Relative 2 %   Eosinophils Absolute 0.1 0.0 - 0.5 K/uL   Basophils Relative 0 %   Basophils Absolute 0.0 0.0 - 0.1 K/uL   Immature Granulocytes 0 %   Abs Immature Granulocytes 0.02 0.00 - 0.07 K/uL    Imaging Studies:     Currently EPIC will not allow sonographic studies to automatically populate into notes.  In the meantime, copy  and paste results into note or free text.  Medications:  Scheduled . docusate sodium  100 mg Oral BID  . metoCLOPramide  10 mg Oral Q6H  . ondansetron  4 mg Oral TID AC & HS  . prenatal multivitamin  1 tablet Oral Q1200   I have reviewed the patient's current medications.  ASSESSMENT:  Cyclic vomiting in early pregnancy. Elevated LFT's s/p cholecystectomy without suspicion of surgical complication Patient Active Problem List   Diagnosis Date Noted  . Nausea & vomiting 04/18/2020  . Calculus of gallbladder without cholecystitis without obstruction 04/06/2020  . Abdominal migraine 02/22/2015  . Cyclical vomiting 63/14/9702  . Allergic rhinitis 03/13/2013    PLAN: Agree with slow reintroduction of food.  Continue zofran. Will add steroids per hyperemesis gravidarum protocol, as these patients with cyclic vomiting usually have challenging courses of Hyperemesis in first trimester, and require extensive support and recurrent bouts of admission for rehydration til HCG levels improve in second trimester.   Kathleen Grimes 04/19/2020,10:57 AM

## 2020-04-19 NOTE — Progress Notes (Signed)
[facility] Progress Note     Subjective: Labs improving but still with nausea. Says she vomited yesterday. Thinks the zofran is helping.   Objective: Vital signs in last 24 hours: Temp:  [97.6 F (36.4 C)-99.5 F (37.5 C)] 97.6 F (36.4 C) (05/04 1405) Pulse Rate:  [62-80] 80 (05/04 1405) Resp:  [16-20] 18 (05/04 1405) BP: (92-100)/(51-64) 94/64 (05/04 1405) SpO2:  [98 %-100 %] 100 % (05/04 1405) Weight:  [59.1 kg] 59.1 kg (05/04 1102) Last BM Date: 04/15/20  Intake/Output from previous day: 05/03 0701 - 05/04 0700 In: 1498.3 [I.V.:498.3; IV Piggyback:1000] Out: -  Intake/Output this shift: No intake/output data recorded.  General appearance: alert, cooperative and no distress Resp: normal work of breathing GI: soft, nondistended, appropriately tender RUQ, port sites healing   Lab Results:  Recent Labs    04/18/20 0637 04/19/20 0605  WBC 7.9 5.7  HGB 13.2 10.4*  HCT 37.4 30.1*  PLT 246 196   BMET Recent Labs    04/18/20 0637 04/19/20 0605  NA 134* 136  K 3.1* 3.1*  CL 99 103  CO2 21* 24  GLUCOSE 91 84  BUN 7 5*  CREATININE 0.45 0.43*  CALCIUM 9.4 8.4*    Assessment/Plan: Ms. Luevano is a 19 yo s/p laparoscopic cholecystectomy. Doing fair and LFTs improving but still with nausea. Multifactorial including chronic nausea/vomiting, pregnancy, and post op. LFTs improving on their own, no need for further imaging at this time.   Zofran scheduled before meals Regular diet LFTs in AM Dr. Emelda Fear to weigh in appreciate assistance    LOS: 0 days    Lucretia Roers 04/19/2020

## 2020-04-20 ENCOUNTER — Ambulatory Visit: Payer: Medicaid Other | Admitting: Nurse Practitioner

## 2020-04-20 DIAGNOSIS — O99111 Other diseases of the blood and blood-forming organs and certain disorders involving the immune mechanism complicating pregnancy, first trimester: Secondary | ICD-10-CM

## 2020-04-20 DIAGNOSIS — Z9049 Acquired absence of other specified parts of digestive tract: Secondary | ICD-10-CM

## 2020-04-20 DIAGNOSIS — D72829 Elevated white blood cell count, unspecified: Secondary | ICD-10-CM

## 2020-04-20 DIAGNOSIS — O219 Vomiting of pregnancy, unspecified: Secondary | ICD-10-CM

## 2020-04-20 DIAGNOSIS — O99281 Endocrine, nutritional and metabolic diseases complicating pregnancy, first trimester: Secondary | ICD-10-CM

## 2020-04-20 DIAGNOSIS — Z3A11 11 weeks gestation of pregnancy: Secondary | ICD-10-CM

## 2020-04-20 DIAGNOSIS — E876 Hypokalemia: Secondary | ICD-10-CM

## 2020-04-20 LAB — COMPREHENSIVE METABOLIC PANEL
ALT: 138 U/L — ABNORMAL HIGH (ref 0–44)
AST: 59 U/L — ABNORMAL HIGH (ref 15–41)
Albumin: 3 g/dL — ABNORMAL LOW (ref 3.5–5.0)
Alkaline Phosphatase: 102 U/L (ref 38–126)
Anion gap: 3 — ABNORMAL LOW (ref 5–15)
BUN: <5 mg/dL — ABNORMAL LOW (ref 6–20)
CO2: 24 mmol/L (ref 22–32)
Calcium: 8.1 mg/dL — ABNORMAL LOW (ref 8.9–10.3)
Chloride: 107 mmol/L (ref 98–111)
Creatinine, Ser: 0.36 mg/dL — ABNORMAL LOW (ref 0.44–1.00)
GFR calc Af Amer: >60 mL/min (ref >60)
GFR calc non Af Amer: >60 mL/min (ref >60)
Glucose, Bld: 103 mg/dL — ABNORMAL HIGH (ref 70–99)
Potassium: 3 mmol/L — ABNORMAL LOW (ref 3.5–5.1)
Sodium: 134 mmol/L — ABNORMAL LOW (ref 135–145)
Total Bilirubin: 0.7 mg/dL (ref 0.3–1.2)
Total Protein: 5.6 g/dL — ABNORMAL LOW (ref 6.5–8.1)

## 2020-04-20 MED ORDER — POTASSIUM CHLORIDE 10 MEQ/100ML IV SOLN
10.0000 meq | INTRAVENOUS | Status: AC
  Administered 2020-04-20 (×2): 10 meq via INTRAVENOUS
  Filled 2020-04-20 (×3): qty 100

## 2020-04-20 MED ORDER — ONDANSETRON HCL 4 MG PO TABS: 4.0000 mg | ORAL_TABLET | Freq: Three times a day (TID) | ORAL | 0 refills | Status: AC

## 2020-04-20 NOTE — Progress Notes (Addendum)
Subjective: Hospital day 3 for nausea , vomiting, now 7 days s/p laparoscopic Cholecystectomy, now [redacted]w[redacted]d with singleton intrauterine pregnancy.  Patient reports tolerating PO, + flatus and no problems voiding.  No BM yet since last Thursday,  Was turning the corner on tolerating foods yesterday, and actually probably was tolerating PO yesterday before the steroid dose.   So I would like to see how she does on antiemetics, and will follow up closely once she is d/c'd.  Objective: I have reviewed patient's vital signs, medications and labs.  General: alert, cooperative, appears stated age and no distress Resp: normal respiration GI: normal findings: soft, non-tender and incision: clean Vaginal Bleeding: none  CMP Latest Ref Rng & Units 04/20/2020 04/19/2020 04/18/2020  Glucose 70 - 99 mg/dL 130(Q) 84 91  BUN 6 - 20 mg/dL <6(V) 5(L) 7  Creatinine 0.44 - 1.00 mg/dL 7.84(O) 9.62(X) 5.28  Sodium 135 - 145 mmol/L 134(L) 136 134(L)  Potassium 3.5 - 5.1 mmol/L 3.0(L) 3.1(L) 3.1(L)  Chloride 98 - 111 mmol/L 107 103 99  CO2 22 - 32 mmol/L 24 24 21(L)  Calcium 8.9 - 10.3 mg/dL 8.1(L) 8.4(L) 9.4  Total Protein 6.5 - 8.1 g/dL 4.1(L) 2.4(M) 7.1  Total Bilirubin 0.3 - 1.2 mg/dL 0.7 0.1(U) 2.7(O)  Alkaline Phos 38 - 126 U/L 102 98 127(H)  AST 15 - 41 U/L 59(H) 62(H) 58(H)  ALT 0 - 44 U/L 138(H) 141(H) 193(H)   Prior to this admission, she had normal LFT's Filed Weights   04/18/20 0553 04/19/20 1102 04/20/20 0541  Weight: 60.7 kg 59.1 kg 59.9 kg  pt has not lost but 1 kg in 4 wks.  Assessment/Plan: persisitent low grade elevation of LFT HypoKalemia,  IUP 11 w 3 S/p lap cholecystectomy  Nausea and vomiting improved Cyclic vomiting.  Plan: add K to IV Fluids,  10 mEQ x 3 doses.             Stop medrol dosing           Consider following as outpatient            Decision re: further imaging by General surgeon.  LOS: 1 day    Tilda Burrow 04/20/2020, 7:54 AM

## 2020-04-20 NOTE — Discharge Summary (Signed)
Physician Discharge Summary  Patient ID: ADISSON DEAK MRN: [id number] DOB/AGE: 22-Aug-2001 19 y.o.  Admit date: 04/18/2020 Discharge date: 04/20/2020  Admission Diagnoses: Nausea and vomiting, elevated liver function test   Discharge Diagnoses:  Active Problems:   Nausea & vomiting   Discharged Condition: good  Hospital Course: Kathleen Grimes is a sweet patient who is [redacted] weeks pregnant and had a cholecystectomy last week for symptomatic cholelithiasis. She continues to have nausea and vomiting, and was in the ED. She was noted to have elevated LFTs, so I admitted her for observation. Her LFTs have trended down on their own and are potentially related to her nausea/vomiting. She has been hydrated and started on zofran. She was also seen by Dr. Glo Herring and had improvement in the nausea with zofran, so a steroid taper was held for now. Her etiology of her nausea and vomiting is likely multifactoral including her chronic nausea and vomiting related to abdominal migraines, marijuana use, pregnancy and post operative.   Prior to discharge she was tolerating a diet and was not having any pain and never complained of pain. She was sent home on Zofran.   Consults: gynecology- Dr. Glo Herring   Significant Diagnostic Studies:   Results for NILSA, MACHT (MRN [id number]) as of 04/20/2020 15:24  Ref. Range 04/12/2020 11:45 04/18/2020 06:37 04/19/2020 06:05 04/20/2020 06:05  Sodium Latest Ref Range: 135 - 145 mmol/L 134 (L) 134 (L) 136 134 (L)  Potassium Latest Ref Range: 3.5 - 5.1 mmol/L 3.7 3.1 (L) 3.1 (L) 3.0 (L)  Chloride Latest Ref Range: 98 - 111 mmol/L 104 99 103 107  CO2 Latest Ref Range: 22 - 32 mmol/L 23 21 (L) 24 24  Glucose Latest Ref Range: 70 - 99 mg/dL 84 91 84 103 (H)  BUN Latest Ref Range: 6 - 20 mg/dL 5 (L) 7 5 (L) <5 (L)  Creatinine Latest Ref Range: 0.44 - 1.00 mg/dL 0.48 0.45 0.43 (L) 0.36 (L)  Calcium Latest Ref Range: 8.9 - 10.3 mg/dL 8.8 (L) 9.4 8.4 (L) 8.1 (L)  Anion gap Latest Ref  Range: 5 - 15  7 14 9 3  (L)  Alkaline Phosphatase Latest Ref Range: 38 - 126 U/L 38 127 (H) 98 102  Albumin Latest Ref Range: 3.5 - 5.0 g/dL 4.0 3.9 2.9 (L) 3.0 (L)  Lipase Latest Ref Range: 11 - 51 U/L  16    AST Latest Ref Range: 15 - 41 U/L 21 58 (H) 62 (H) 59 (H)  ALT Latest Ref Range: 0 - 44 U/L 34 193 (H) 141 (H) 138 (H)  Total Protein Latest Ref Range: 6.5 - 8.1 g/dL 6.1 (L) 7.1 5.4 (L) 5.6 (L)  Total Bilirubin Latest Ref Range: 0.3 - 1.2 mg/dL 0.9 1.7 (H) 1.4 (H) 0.7  GFR, Est Non African American Latest Ref Range: >60 mL/min >60 >60 >60 >60  GFR, Est African American Latest Ref Range: >60 mL/min >60 >60 >60 >60    Treatments: IV hydration and antiemetic   Discharge Exam: Blood pressure 100/60, pulse (!) 57, temperature 98.7 F (37.1 C), temperature source Oral, resp. rate 18, height 5\' 7"  (1.702 m), weight 59.9 kg, last menstrual period 02/07/2020, SpO2 100 %. General appearance: alert, cooperative and no distress Resp: normal work of breathing GI: soft, nondistended, non tender, port sites healing without drainage or erythema  Disposition: Discharge disposition: 01-Home or Self Care      Home  Discharge Instructions    Call MD for:  persistant dizziness or light-headedness  Complete by: As directed    Call MD for:  persistant nausea and vomiting   Complete by: As directed    Call MD for:  redness, tenderness, or signs of infection (pain, swelling, redness, odor or green/yellow discharge around incision site)   Complete by: As directed    Call MD for:  severe uncontrolled pain   Complete by: As directed    Call MD for:  temperature >100.4   Complete by: As directed    Diet - low sodium heart healthy   Complete by: As directed    Increase activity slowly   Complete by: As directed      Allergies as of 04/20/2020      Reactions   Hydrocodone Nausea Only      Medication List    STOP taking these medications   HYDROcodone-acetaminophen 5-325 MG  tablet Commonly known as: NORCO/VICODIN     TAKE these medications   docusate sodium 100 MG capsule Commonly known as: Colace Take 1 capsule (100 mg total) by mouth 2 (two) times daily as needed (constipation related to narcotic use).   metoCLOPramide 10 MG tablet Commonly known as: REGLAN Take 1 tablet (10 mg total) by mouth every 6 (six) hours.   ondansetron 4 MG tablet Commonly known as: ZOFRAN Take 1 tablet (4 mg total) by mouth 4 (four) times daily -  before meals and at bedtime.   prenatal multivitamin Tabs tablet Take 2 tablets by mouth daily at 12 noon. Gummies      Follow-up Information    Tilda Burrow, MD Follow up.   Specialties: Obstetrics and Gynecology, Radiology Why: 04/25/2020 appointment with Dr. Emelda Fear  Dr. Lincoln Brigham [phone] Contact information: [address] [place] [address] [phone]          Future Appointments  Date Time Provider Department Center  04/25/2020 10:00 AM [facility] [name] [facility] None  04/25/2020 10:30 AM Moss Mc, RN [facility]  04/25/2020 10:50 AM Cheral Marker, CNM [facility]  04/26/2020 11:50 AM Lucretia Roers, MD [facility] None    Signed: Lucretia Roers 04/20/2020, 3:25 PM

## 2020-04-20 NOTE — Progress Notes (Signed)
Discharge instructions reviewed with patient. Patient verbalized understanding of instructions. Patient discharged home in stable condition.  

## 2020-04-20 NOTE — Discharge Instructions (Signed)
Your nausea and vomiting is likely multi-factoral with the pregnancy, marijuana use, chronic nausea/vomiting and post operative nausea/vomiting.  Continue activity and diet as tolerated. Dr. Emelda Fear will see you 04/25/2020 and will get labs. Dr. Emelda Fear has provided his cell phone number pending you having issues or questions on the weekend [phone].  Dr. Henreitta Leber will call you 04/26/2020 and see if you if there is any additional concern or questions.   Your potassium was low in the hospital. Eat bananas, sweet potatoes, cucumbers, cooked spinach, cooked broccoli in the next week.  Take your zofran with meals and at bedtime until seen by Dr. Emelda Fear.   Nausea and Vomiting, Adult Nausea is the feeling that you have an upset stomach or that you are about to vomit. Vomiting is when stomach contents are thrown up and out of the mouth as a result of nausea. Vomiting can make you feel weak and cause you to become dehydrated. Dehydration can make you feel tired and thirsty, cause you to have a dry mouth, and decrease how often you urinate. Older adults and people with other diseases or a weak disease-fighting system (immune system) are at higher risk for dehydration. It is important to treat your nausea and vomiting as told by your health care provider. Follow these instructions at home: Watch your symptoms for any changes. Tell your health care provider about them. Follow these instructions to care for yourself at home. Eating and drinking      Take an oral rehydration solution (ORS). This is a drink that is sold at pharmacies and retail stores.  Drink clear fluids slowly and in small amounts as you are able. Clear fluids include water, ice chips, low-calorie sports drinks, and fruit juice that has water added (diluted fruit juice).  Eat bland, easy-to-digest foods in small amounts as you are able. These foods include bananas, applesauce, rice, lean meats, toast, and crackers.  Avoid fluids  that contain a lot of sugar or caffeine, such as energy drinks, sports drinks, and soda.  Avoid alcohol.  Avoid spicy or fatty foods. General instructions  Take over-the-counter and prescription medicines only as told by your health care provider.  Drink enough fluid to keep your urine pale yellow.  Wash your hands often using soap and water. If soap and water are not available, use hand sanitizer.  Make sure that all people in your household wash their hands well and often.  Rest at home while you recover.  Watch your condition for any changes.  Breathe slowly and deeply when you feel nauseated.  Keep all follow-up visits as told by your health care provider. This is important. Contact a health care provider if:  Your symptoms get worse.  You have new symptoms.  You have a fever.  You cannot drink fluids without vomiting.  Your nausea does not go away after 2 days.  You feel light-headed or dizzy.  You have a headache.  You have muscle cramps.  You have a rash.  You have pain while urinating. Get help right away if:  You have pain in your chest, neck, arm, or jaw.  You feel extremely weak or you faint.  You have persistent vomiting.  You have vomit that is bright red or looks like black coffee grounds.  You have bloody or black stools or stools that look like tar.  You have a severe headache, a stiff neck, or both.  You have severe pain, cramping, or bloating in your abdomen.  You have difficulty  breathing, or you are breathing very quickly.  Your heart is beating very quickly.  Your skin feels cold and clammy.  You feel confused.  You have signs of dehydration, such as: ? Dark urine, very little urine, or no urine. ? Cracked lips. ? Dry mouth. ? Sunken eyes. ? Sleepiness. ? Weakness. These symptoms may represent a serious problem that is an emergency. Do not wait to see if the symptoms will go away. Get medical help right away. Call your  local emergency services (911 in the [place] Do not drive yourself to the hospital. Summary  Nausea is the feeling that you have an upset stomach or that you are about to vomit. As nausea gets worse, it can lead to vomiting. Vomiting can make you feel weak and cause you to become dehydrated.  Follow instructions from your health care provider about eating and drinking to prevent dehydration.  Take over-the-counter and prescription medicines only as told by your health care provider.  Contact your health care provider if your symptoms get worse, or you have new symptoms.  Keep all follow-up visits as told by your health care provider. This is important. This information is not intended to replace advice given to you by your health care provider. Make sure you discuss any questions you have with your health care provider. Document Revised: 03/27/2019 Document Reviewed: 05/13/2018 Elsevier Patient Education  [employer].

## 2020-04-22 ENCOUNTER — Other Ambulatory Visit: Payer: Self-pay | Admitting: Obstetrics & Gynecology

## 2020-04-22 ENCOUNTER — Encounter: Payer: Self-pay | Admitting: General Surgery

## 2020-04-22 DIAGNOSIS — Z3682 Encounter for antenatal screening for nuchal translucency: Secondary | ICD-10-CM

## 2020-04-25 ENCOUNTER — Other Ambulatory Visit: Payer: Self-pay

## 2020-04-25 ENCOUNTER — Encounter: Payer: Self-pay | Admitting: General Surgery

## 2020-04-25 ENCOUNTER — Ambulatory Visit: Payer: Medicaid Other | Admitting: *Deleted

## 2020-04-25 ENCOUNTER — Ambulatory Visit (INDEPENDENT_AMBULATORY_CARE_PROVIDER_SITE_OTHER): Payer: Medicaid Other

## 2020-04-25 ENCOUNTER — Ambulatory Visit (INDEPENDENT_AMBULATORY_CARE_PROVIDER_SITE_OTHER): Payer: Medicaid Other | Admitting: Women's Health

## 2020-04-25 ENCOUNTER — Encounter: Payer: Self-pay | Admitting: Women's Health

## 2020-04-25 VITALS — BP 106/78 | HR 110 | Wt 122.0 lb

## 2020-04-25 DIAGNOSIS — Z3401 Encounter for supervision of normal first pregnancy, first trimester: Secondary | ICD-10-CM

## 2020-04-25 DIAGNOSIS — F1290 Cannabis use, unspecified, uncomplicated: Secondary | ICD-10-CM | POA: Diagnosis not present

## 2020-04-25 DIAGNOSIS — Z3A12 12 weeks gestation of pregnancy: Secondary | ICD-10-CM

## 2020-04-25 DIAGNOSIS — R7989 Other specified abnormal findings of blood chemistry: Secondary | ICD-10-CM | POA: Diagnosis not present

## 2020-04-25 DIAGNOSIS — O99321 Drug use complicating pregnancy, first trimester: Secondary | ICD-10-CM

## 2020-04-25 DIAGNOSIS — O219 Vomiting of pregnancy, unspecified: Secondary | ICD-10-CM

## 2020-04-25 DIAGNOSIS — Z3402 Encounter for supervision of normal first pregnancy, second trimester: Secondary | ICD-10-CM

## 2020-04-25 DIAGNOSIS — Z3682 Encounter for antenatal screening for nuchal translucency: Secondary | ICD-10-CM

## 2020-04-25 DIAGNOSIS — Z3400 Encounter for supervision of normal first pregnancy, unspecified trimester: Secondary | ICD-10-CM | POA: Insufficient documentation

## 2020-04-25 DIAGNOSIS — Z9049 Acquired absence of other specified parts of digestive tract: Secondary | ICD-10-CM | POA: Insufficient documentation

## 2020-04-25 LAB — POCT URINALYSIS DIPSTICK OB
Blood, UA: NEGATIVE
Glucose, UA: NEGATIVE
Leukocytes, UA: NEGATIVE
Nitrite, UA: NEGATIVE

## 2020-04-25 MED ORDER — BLOOD PRESSURE MONITOR MISC: 0 refills | Status: DC

## 2020-04-25 MED ORDER — DOXYLAMINE-PYRIDOXINE 10-10 MG PO TBEC: DELAYED_RELEASE_TABLET | ORAL | 6 refills | Status: DC

## 2020-04-25 MED ORDER — PROMETHAZINE HCL 25 MG RE SUPP: 25.0000 mg | Freq: Four times a day (QID) | RECTAL | 6 refills | Status: DC | PRN

## 2020-04-25 NOTE — Progress Notes (Signed)
[name] 12+1 wks,measurements c/w dates,crl 56.99 mm,NB present,NT 1.4 mm,normal ovaries,fhr 159 bpm

## 2020-04-25 NOTE — Patient Instructions (Signed)
Kathleen Grimes, I greatly value your feedback.  If you receive a survey following your visit with [name] today, we appreciate you taking the time to fill it out.  Thanks, [name], CNM, WHNP-BC   [facility] ([address] [place], [place] [address]) Entrance C, located off of [address] Free 24/7 valet parking   Nausea & Vomiting  Have saltine crackers or pretzels by your bed and eat a few bites before you raise your head out of bed in the morning  Eat small frequent meals throughout the day instead of large meals  Drink plenty of fluids throughout the day to stay hydrated, just don't drink a lot of fluids with your meals.  This can make your stomach fill up faster making you feel sick  Do not brush your teeth right after you eat  Products with real ginger are good for nausea, like ginger ale and ginger hard candy Make sure it says made with real ginger!  Sucking on sour candy like lemon heads is also good for nausea  If your prenatal vitamins make you nauseated, take them at night so you will sleep through the nausea  Sea Bands  If you feel like you need medicine for the nausea & vomiting please let us know  If you are unable to keep any fluids or food down please let us know   Constipation  Drink plenty of fluid, preferably water, throughout the day  Eat foods high in fiber such as fruits, vegetables, and grains  Exercise, such as walking, is a good way to keep your bowels regular  Drink warm fluids, especially warm prune juice, or decaf coffee  Eat a 1/2 cup of real oatmeal (not instant), 1/2 cup applesauce, and 1/2-1 cup warm prune juice every day  If needed, you may take Colace (docusate sodium) stool softener once or twice a day to help keep the stool soft.   If you still are having problems with constipation, you may take Miralax once daily as needed to help keep your bowels regular.   Home Blood Pressure Monitoring for Patients    Your provider has recommended that you check your blood pressure (BP) at least once a week at home. If you do not have a blood pressure cuff at home, one will be provided for you. Contact your provider if you have not received your monitor within 1 week.   Helpful Tips for Accurate Home Blood Pressure Checks  . Don't smoke, exercise, or drink caffeine 30 minutes before checking your BP . Use the restroom before checking your BP (a full bladder can raise your pressure) . Relax in a comfortable upright chair . Feet on the ground . Left arm resting comfortably on a flat surface at the level of your heart . Legs uncrossed . Back supported . Sit quietly and don't talk . Place the cuff on your bare arm . Adjust snuggly, so that only two fingertips can fit between your skin and the top of the cuff . Check 2 readings separated by at least one minute . Keep a log of your BP readings . For a visual, please reference this diagram: http://ccnc.care/bpdiagram  Provider Name: [facility]     Phone: [phone]  Zone 1: ALL CLEAR  Continue to monitor your symptoms:  . BP reading is less than 140 (top number) or less than 90 (bottom number)  . No right upper stomach pain . No headaches or  seeing spots . No feeling nauseated or throwing up . No swelling in face and hands  Zone 2: CAUTION Call your doctor's office for any of the following:  . BP reading is greater than 140 (top number) or greater than 90 (bottom number)  . Stomach pain under your ribs in the middle or right side . Headaches or seeing spots . Feeling nauseated or throwing up . Swelling in face and hands  Zone 3: EMERGENCY  Seek immediate medical care if you have any of the following:  . BP reading is greater than160 (top number) or greater than 110 (bottom number) . Severe headaches not improving with Tylenol . Serious difficulty catching your breath . Any worsening symptoms from Zone 2    First Trimester of  Pregnancy The first trimester of pregnancy is from week 1 until the end of week 12 (months 1 through 3). A week after a sperm fertilizes an egg, the egg will implant on the wall of the uterus. This embryo will begin to develop into a baby. Genes from you and your partner are forming the baby. The female genes determine whether the baby is a boy or a girl. At 6-8 weeks, the eyes and face are formed, and the heartbeat can be seen on ultrasound. At the end of 12 weeks, all the baby's organs are formed.  Now that you are pregnant, you will want to do everything you can to have a healthy baby. Two of the most important things are to get good prenatal care and to follow your health care provider's instructions. Prenatal care is all the medical care you receive before the baby's birth. This care will help prevent, find, and treat any problems during the pregnancy and childbirth. BODY CHANGES Your body goes through many changes during pregnancy. The changes vary from woman to woman.   You may gain or lose a couple of pounds at first.  You may feel sick to your stomach (nauseous) and throw up (vomit). If the vomiting is uncontrollable, call your health care provider.  You may tire easily.  You may develop headaches that can be relieved by medicines approved by your health care provider.  You may urinate more often. Painful urination may mean you have a bladder infection.  You may develop heartburn as a result of your pregnancy.  You may develop constipation because certain hormones are causing the muscles that push waste through your intestines to slow down.  You may develop hemorrhoids or swollen, bulging veins (varicose veins).  Your breasts may begin to grow larger and become tender. Your nipples may stick out more, and the tissue that surrounds them (areola) may become darker.  Your gums may bleed and may be sensitive to brushing and flossing.  Dark spots or blotches (chloasma, mask of pregnancy)  may develop on your face. This will likely fade after the baby is born.  Your menstrual periods will stop.  You may have a loss of appetite.  You may develop cravings for certain kinds of food.  You may have changes in your emotions from day to day, such as being excited to be pregnant or being concerned that something may go wrong with the pregnancy and baby.  You may have more vivid and strange dreams.  You may have changes in your hair. These can include thickening of your hair, rapid growth, and changes in texture. Some women also have hair loss during or after pregnancy, or hair that feels dry or thin. Your  hair will most likely return to normal after your baby is born. WHAT TO EXPECT AT YOUR PRENATAL VISITS During a routine prenatal visit:  You will be weighed to make sure you and the baby are growing normally.  Your blood pressure will be taken.  Your abdomen will be measured to track your baby's growth.  The fetal heartbeat will be listened to starting around week 10 or 12 of your pregnancy.  Test results from any previous visits will be discussed. Your health care provider may ask you:  How you are feeling.  If you are feeling the baby move.  If you have had any abnormal symptoms, such as leaking fluid, bleeding, severe headaches, or abdominal cramping.  If you have any questions. Other tests that may be performed during your first trimester include:  Blood tests to find your blood type and to check for the presence of any previous infections. They will also be used to check for low iron levels (anemia) and Rh antibodies. Later in the pregnancy, blood tests for diabetes will be done along with other tests if problems develop.  Urine tests to check for infections, diabetes, or protein in the urine.  An ultrasound to confirm the proper growth and development of the baby.  An amniocentesis to check for possible genetic problems.  Fetal screens for spina bifida and  Down syndrome.  You may need other tests to make sure you and the baby are doing well. HOME CARE INSTRUCTIONS  Medicines  Follow your health care provider's instructions regarding medicine use. Specific medicines may be either safe or unsafe to take during pregnancy.  Take your prenatal vitamins as directed.  If you develop constipation, try taking a stool softener if your health care provider approves. Diet  Eat regular, well-balanced meals. Choose a variety of foods, such as meat or vegetable-based protein, fish, milk and low-fat dairy products, vegetables, fruits, and whole grain breads and cereals. Your health care provider will help you determine the amount of weight gain that is right for you.  Avoid raw meat and uncooked cheese. These carry germs that can cause birth defects in the baby.  Eating four or five small meals rather than three large meals a day may help relieve nausea and vomiting. If you start to feel nauseous, eating a few soda crackers can be helpful. Drinking liquids between meals instead of during meals also seems to help nausea and vomiting.  If you develop constipation, eat more high-fiber foods, such as fresh vegetables or fruit and whole grains. Drink enough fluids to keep your urine clear or pale yellow. Activity and Exercise  Exercise only as directed by your health care provider. Exercising will help you:  Control your weight.  Stay in shape.  Be prepared for labor and delivery.  Experiencing pain or cramping in the lower abdomen or low back is a good sign that you should stop exercising. Check with your health care provider before continuing normal exercises.  Try to avoid standing for long periods of time. Move your legs often if you must stand in one place for a long time.  Avoid heavy lifting.  Wear low-heeled shoes, and practice good posture.  You may continue to have sex unless your health care provider directs you otherwise. Relief of Pain  or Discomfort  Wear a good support bra for breast tenderness.    Take warm sitz baths to soothe any pain or discomfort caused by hemorrhoids. Use hemorrhoid cream if your health care provider  approves.    Rest with your legs elevated if you have leg cramps or low back pain.  If you develop varicose veins in your legs, wear support hose. Elevate your feet for 15 minutes, 3-4 times a day. Limit salt in your diet. Prenatal Care  Schedule your prenatal visits by the twelfth week of pregnancy. They are usually scheduled monthly at first, then more often in the last 2 months before delivery.  Write down your questions. Take them to your prenatal visits.  Keep all your prenatal visits as directed by your health care provider. Safety  Wear your seat belt at all times when driving.  Make a list of emergency phone numbers, including numbers for family, friends, the hospital, and police and fire departments. General Tips  Ask your health care provider for a referral to a local prenatal education class. Begin classes no later than at the beginning of month 6 of your pregnancy.  Ask for help if you have counseling or nutritional needs during pregnancy. Your health care provider can offer advice or refer you to specialists for help with various needs.  Do not use hot tubs, steam rooms, or saunas.  Do not douche or use tampons or scented sanitary pads.  Do not cross your legs for long periods of time.  Avoid cat litter boxes and soil used by cats. These carry germs that can cause birth defects in the baby and possibly loss of the fetus by miscarriage or stillbirth.  Avoid all smoking, herbs, alcohol, and medicines not prescribed by your health care provider. Chemicals in these affect the formation and growth of the baby.  Schedule a dentist appointment. At home, brush your teeth with a soft toothbrush and be gentle when you floss. SEEK MEDICAL CARE IF:   You have dizziness.  You have mild  pelvic cramps, pelvic pressure, or nagging pain in the abdominal area.  You have persistent nausea, vomiting, or diarrhea.  You have a bad smelling vaginal discharge.  You have pain with urination.  You notice increased swelling in your face, hands, legs, or ankles. SEEK IMMEDIATE MEDICAL CARE IF:   You have a fever.  You are leaking fluid from your vagina.  You have spotting or bleeding from your vagina.  You have severe abdominal cramping or pain.  You have rapid weight gain or loss.  You vomit blood or material that looks like coffee grounds.  You are exposed to [name] measles and have never had them.  You are exposed to fifth disease or chickenpox.  You develop a severe headache.  You have shortness of breath.  You have any kind of trauma, such as from a fall or a car accident. Document Released: 11/27/2001 Document Revised: 04/19/2014 Document Reviewed: 10/13/2013 [facility] Patient Information 2015 [place], [place]. This information is not intended to replace advice given to you by your health care provider. Make sure you discuss any questions you have with your health care provider.

## 2020-04-25 NOTE — Progress Notes (Signed)
INITIAL OBSTETRICAL VISIT Patient name: Kathleen Grimes MRN [id number]  Date of birth: 2001/06/09 Chief Complaint:   Initial Prenatal Visit (nt/it, nausea)  History of Present Illness:   Kathleen Grimes is a 19 y.o. G1P0 Caucasian female at [redacted]w[redacted]d by 7wk u/s, with an Estimated Date of Delivery: 11/06/20 being seen today for her initial obstetrical visit.   Her obstetrical history is significant for primigravida.   S/P cholecystectomy 04/13/20 w/ Dr. Constance Haw, readmitted 5/3 d/t n/v and increased LFTs that trended down and was d/c'd on 5/5 on zofran. Hepatitis panel neg. Today she reports continued n/v, zofran not staying down. Has vomited 5 times today. No diarrhea. No pain. Hasn't tried eating today, no food stayed down yesterday. Reported pre-gravid weight 135lb, has lost 13lb. THC smoker, continues to smoke to try to help w/ n/v.  Depression screen [facility] 2/9 04/25/2020  Decreased Interest 3  Down, Depressed, Hopeless 0  PHQ - 2 Score 3  Altered sleeping 0  Tired, decreased energy 1  Change in appetite 2  Feeling bad or failure about yourself  0  Trouble concentrating 0  Moving slowly or fidgety/restless 1  Suicidal thoughts 0  PHQ-9 Score 7    Patient's last menstrual period was 02/07/2020. Last pap <21yo. Results were: n/a Review of Systems:   Pertinent items are noted in HPI Denies cramping/contractions, leakage of fluid, vaginal bleeding, abnormal vaginal discharge w/ itching/odor/irritation, headaches, visual changes, shortness of breath, chest pain, abdominal pain, severe nausea/vomiting, or problems with urination or bowel movements unless otherwise stated above.  Pertinent History Reviewed:  Reviewed past medical,surgical, social, obstetrical and family history.  Reviewed problem list, medications and allergies. OB History  Gravida Para Term Preterm AB Living  1            SAB TAB Ectopic Multiple Live Births               # Outcome Date GA Lbr Len/2nd Weight Sex Delivery  Anes PTL Lv  1 Current            Physical Assessment:   Vitals:   04/25/20 1044  BP: 106/78  Pulse: (!) 110  Weight: 122 lb (55.3 kg)  Body mass index is 19.11 kg/m.       Physical Examination:  General appearance - well appearing, and in no distress  Mental status - alert, oriented to person, place, and time  Psych:  She has a normal mood and affect  Skin - warm and dry, normal color, no suspicious lesions noted  Chest - effort normal, all lung fields clear to auscultation bilaterally  Heart - normal rate and regular rhythm  Abdomen - soft, nontender  Extremities:  No swelling or varicosities noted  Thin prep pap is not done   TODAY'S NT [name] 12+1 wks,measurements c/w dates,crl 56.99 mm,NB present,NT 1.4 mm,normal ovaries,fhr 159 bpm  Results for orders placed or performed in visit on 04/25/20 (from the past 24 hour(s))  POC Urinalysis Dipstick OB   Collection Time: 04/25/20 11:10 AM  Result Value Ref Range   Color, UA     Clarity, UA     Glucose, UA Negative Negative   Bilirubin, UA     Ketones, UA large    Spec Grav, UA     Blood, UA neg    pH, UA     POC,PROTEIN,UA Small (1+) Negative, Trace, Small (1+), Moderate (2+), Large (3+), 4+   Urobilinogen, UA     Nitrite, UA neg  Leukocytes, UA Negative Negative   Appearance     Odor      Assessment & Plan:  1) Low-Risk Pregnancy G1P0 at [redacted]w[redacted]d with an Estimated Date of Delivery: 11/06/20   2) Initial OB visit  3) N/V> w/ 13lb wt loss, large ketones, rx phenergan suppositories, if not helping- go to MAU or ED for IVF/IV nausea meds. Rx diclegis to take once phenergan PR kicks in/able to take po. Check CMP today.   4) S/P cholecystectomy> 4/28 w/ readmit for n/v & elevated LFTs. Repeat CMP today  Meds:  Meds ordered this encounter  Medications  . Blood Pressure Monitor MISC    Sig: For regular home bp monitoring during pregnancy    Dispense:  1 each    Refill:  0    Z34.00  . promethazine (PHENERGAN) 25 MG  suppository    Sig: Place 1 suppository (25 mg total) rectally every 6 (six) hours as needed for nausea or vomiting.    Dispense:  12 suppository    Refill:  6    Order Specific Question:   Supervising Provider    Answer:   Despina Hidden, LUTHER H [[id number]]  . Doxylamine-Pyridoxine (DICLEGIS) 10-10 MG TBEC    Sig: 2 tabs q hs, if sx persist add 1 tab q am on day 3, if sx persist add 1 tab q afternoon on day 4    Dispense:  100 tablet    Refill:  6    Order Specific Question:   Supervising Provider    Answer:   Duane Lope H [[id number]]    Initial labs obtained Continue prenatal vitamins Reviewed n/v relief measures and warning s/s to report Reviewed recommended weight gain based on pre-gravid BMI Encouraged well-balanced diet Genetic & carrier screening discussed: requests Panorama, NT/IT and Horizon 14  Ultrasound discussed; fetal survey: requested [facility] completed> form faxed if has or is planning to apply for medicaid The nature of [employer] for [employer] with multiple MDs and other Advanced Practice Providers was explained to patient; also emphasized that fellows, residents, and students are part of our team. Does not have home bp cuff. Rx faxed to [facility]. Check bp weekly, let us know if >140/90.   Follow-up: Return in about 1 week (around 05/02/2020) for LROB, MD, in person.   Orders Placed This Encounter  Procedures  . Urine Culture  . GC/Chlamydia Probe Amp  . Integrated 1  . Genetic Screening  . Hepatitis C antibody  . Obstetric Panel, Including HIV  . Pain Management Screening Profile (10S)  . Comprehensive metabolic panel  . POC Urinalysis Dipstick OB    Cheral Marker CNM, [facility] 04/25/2020 11:48 AM

## 2020-04-26 ENCOUNTER — Encounter: Payer: Self-pay | Admitting: Women's Health

## 2020-04-26 ENCOUNTER — Telehealth (INDEPENDENT_AMBULATORY_CARE_PROVIDER_SITE_OTHER): Payer: Self-pay | Admitting: General Surgery

## 2020-04-26 DIAGNOSIS — R1115 Cyclical vomiting syndrome unrelated to migraine: Secondary | ICD-10-CM

## 2020-04-26 DIAGNOSIS — R7989 Other specified abnormal findings of blood chemistry: Secondary | ICD-10-CM | POA: Insufficient documentation

## 2020-04-26 DIAGNOSIS — R17 Unspecified jaundice: Secondary | ICD-10-CM | POA: Insufficient documentation

## 2020-04-26 DIAGNOSIS — K8020 Calculus of gallbladder without cholecystitis without obstruction: Secondary | ICD-10-CM

## 2020-04-26 LAB — INTEGRATED 1

## 2020-04-26 LAB — PMP SCREEN PROFILE (10S), URINE
Amphetamine Scrn, Ur: NEGATIVE ng/mL
BARBITURATE SCREEN URINE: NEGATIVE ng/mL
BENZODIAZEPINE SCREEN, URINE: NEGATIVE ng/mL
CANNABINOIDS UR QL SCN: POSITIVE ng/mL — AB
Cocaine (Metab) Scrn, Ur: NEGATIVE ng/mL
Creatinine(Crt), U: 222.9 mg/dL (ref 20.0–300.0)
Methadone Screen, Urine: NEGATIVE ng/mL
OXYCODONE+OXYMORPHONE UR QL SCN: NEGATIVE ng/mL
Opiate Scrn, Ur: NEGATIVE ng/mL
Ph of Urine: 6 (ref 4.5–8.9)
Phencyclidine Qn, Ur: NEGATIVE ng/mL
Propoxyphene Scrn, Ur: NEGATIVE ng/mL

## 2020-04-26 LAB — COMPREHENSIVE METABOLIC PANEL
ALT: 95 IU/L — ABNORMAL HIGH (ref 0–32)
AST: 30 IU/L (ref 0–40)
Albumin/Globulin Ratio: 2 (ref 1.2–2.2)
Albumin: 4.9 g/dL (ref 3.9–5.0)
Alkaline Phosphatase: 125 IU/L — ABNORMAL HIGH (ref 39–117)
BUN/Creatinine Ratio: 17 (ref 9–23)
BUN: 9 mg/dL (ref 6–20)
Bilirubin Total: 1.6 mg/dL — ABNORMAL HIGH (ref 0.0–1.2)
CO2: 17 mmol/L — ABNORMAL LOW (ref 20–29)
Calcium: 10 mg/dL (ref 8.7–10.2)
Chloride: 96 mmol/L (ref 96–106)
Creatinine, Ser: 0.54 mg/dL — ABNORMAL LOW (ref 0.57–1.00)
GFR calc Af Amer: 157 mL/min/{1.73_m2} (ref >59)
GFR calc non Af Amer: 136 mL/min/{1.73_m2} (ref >59)
Globulin, Total: 2.5 g/dL (ref 1.5–4.5)
Glucose: 105 mg/dL — ABNORMAL HIGH (ref 65–99)
Potassium: 3.9 mmol/L (ref 3.5–5.2)
Sodium: 136 mmol/L (ref 134–144)
Total Protein: 7.4 g/dL (ref 6.0–8.5)

## 2020-04-26 NOTE — Progress Notes (Signed)
[facility]  I am calling the patient for post operative evaluation due to the current COVID 19 pandemic.  The patient had a laparoscopic cholecystectomy on 04/13/2020 and was then readmitted for nausea/vomiting last week. This is likely multifactorial with her being pregnant and having chronic nausea/vomiting and chronic marijuana use.  she has a [name] yesterday with OB that demonstrated pregnancy and HR. Patient said she was still vomiting again despite the zofran. They seem to have given her some phenergan/ diclegis.  LFTs repeated and some labs are down and some are up. This again likely related to the vomiting. Normal WBC.  No abdominal pain reported to Ob yesterday.  I was unable to get her on the phone and left a message for her to call with issues. She sees Ob again next week. I will reach out to Dr. Emelda Fear regarding the patient. Agree if continued symptoms to go to the ED.  Will see the patient PRN. Activity and diet as tolerated/ limited by pregnancy.   Pathology: A. GALLBLADDER, CHOLECYSTECTOMY:  - Chronic cholecystitis.  - There is no evidence of malignancy.   LFTs  Results for Kathleen Grimes, Kathleen Grimes (MRN [id number]) as of 04/26/2020 12:09  Ref. Range 04/20/2020 06:05 04/25/2020 10:34 04/25/2020 11:10 04/25/2020 11:53  Sodium Latest Ref Range: 134 - 144 mmol/L 134 (L)   136  Potassium Latest Ref Range: 3.5 - 5.2 mmol/L 3.0 (L)   3.9  Chloride Latest Ref Range: 96 - 106 mmol/L 107   96  CO2 Latest Ref Range: 20 - 29 mmol/L 24   17 (L)  Glucose Latest Ref Range: 65 - 99 mg/dL 182 (H)   993 (H)  BUN Latest Ref Range: 6 - 20 mg/dL <5 (L)   9  Creatinine Latest Ref Range: 0.57 - 1.00 mg/dL 7.16 (L)   9.67 (L)  Calcium Latest Ref Range: 8.7 - 10.2 mg/dL 8.1 (L)   89.3  Anion gap Latest Ref Range: 5 - 15  3 (L)     BUN/Creatinine Ratio Latest Ref Range: 9 - 23     17  Alkaline Phosphatase Latest Ref Range: 39 - 117 IU/L 102   125 (H)  Albumin Latest Ref Range: 3.9 - 5.0 g/dL 3.0  (L)   4.9  Albumin/Globulin Ratio Latest Ref Range: 1.2 - 2.2     2.0  AST Latest Ref Range: 0 - 40 IU/L 59 (H)   30  ALT Latest Ref Range: 0 - 32 IU/L 138 (H)   95 (H)  Total Protein Latest Ref Range: 6.0 - 8.5 g/dL 5.6 (L)   7.4  Total Bilirubin Latest Ref Range: 0.0 - 1.2 mg/dL 0.7   1.6 (H)  GFR, Est Non African American Latest Ref Range: >59 mL/min/1.73 >60   136  GFR, Est African American Latest Ref Range: >59 mL/min/1.73 >60   157  Globulin, Total Latest Ref Range: 1.5 - 4.5 g/dL    2.5    Algis Greenhouse, MD [facility] [address] [place], [place] [address] [phone] (office)

## 2020-04-27 LAB — OBSTETRIC PANEL, INCLUDING HIV
Antibody Screen: NEGATIVE
Basophils Absolute: 0 10*3/uL (ref 0.0–0.2)
Basos: 0 %
EOS (ABSOLUTE): 0 10*3/uL (ref 0.0–0.4)
Eos: 0 %
HIV Screen 4th Generation wRfx: NONREACTIVE
Hematocrit: 40.8 % (ref 34.0–46.6)
Hemoglobin: 14.5 g/dL (ref 11.1–15.9)
Hepatitis B Surface Ag: NEGATIVE
Immature Grans (Abs): 0 10*3/uL (ref 0.0–0.1)
Immature Granulocytes: 0 %
Lymphocytes Absolute: 0.7 10*3/uL (ref 0.7–3.1)
Lymphs: 8 %
MCH: 32.6 pg (ref 26.6–33.0)
MCHC: 35.5 g/dL (ref 31.5–35.7)
MCV: 92 fL (ref 79–97)
Monocytes Absolute: 0.3 10*3/uL (ref 0.1–0.9)
Monocytes: 3 %
Neutrophils Absolute: 8.1 10*3/uL — ABNORMAL HIGH (ref 1.4–7.0)
Neutrophils: 89 %
Platelets: 403 10*3/uL (ref 150–450)
RBC: 4.45 x10E6/uL (ref 3.77–5.28)
RDW: 13 % (ref 11.7–15.4)
RPR Ser Ql: NONREACTIVE
Rh Factor: POSITIVE
Rubella Antibodies, IGG: 1.84 index (ref >0.99)
WBC: 9.1 10*3/uL (ref 3.4–10.8)

## 2020-04-27 LAB — INTEGRATED 1
Crown Rump Length: 57 mm
Gest. Age on Collection Date: 12.1 weeks
Maternal Age at EDD: 20.8 yr
Nuchal Translucency (NT): 1.4 mm
Number of Fetuses: 1
PAPP-A Value: 1120.8 ng/mL
Weight: 122 [lb_av]

## 2020-04-27 LAB — HEPATITIS C ANTIBODY: Hep C Virus Ab: <0.1 s/co ratio (ref 0.0–0.9)

## 2020-04-27 LAB — GC/CHLAMYDIA PROBE AMP
Chlamydia trachomatis, NAA: NEGATIVE
Neisseria Gonorrhoeae by PCR: NEGATIVE

## 2020-04-27 LAB — URINE CULTURE

## 2020-05-03 ENCOUNTER — Encounter: Payer: Self-pay | Admitting: Obstetrics & Gynecology

## 2020-05-03 ENCOUNTER — Ambulatory Visit (INDEPENDENT_AMBULATORY_CARE_PROVIDER_SITE_OTHER): Payer: Medicaid Other | Admitting: Obstetrics & Gynecology

## 2020-05-03 ENCOUNTER — Encounter: Payer: Self-pay | Admitting: Women's Health

## 2020-05-03 VITALS — BP 108/72 | HR 95 | Wt 127.0 lb

## 2020-05-03 DIAGNOSIS — R899 Unspecified abnormal finding in specimens from other organs, systems and tissues: Secondary | ICD-10-CM

## 2020-05-03 DIAGNOSIS — Z1389 Encounter for screening for other disorder: Secondary | ICD-10-CM

## 2020-05-03 DIAGNOSIS — Z331 Pregnant state, incidental: Secondary | ICD-10-CM

## 2020-05-03 DIAGNOSIS — Z3401 Encounter for supervision of normal first pregnancy, first trimester: Secondary | ICD-10-CM

## 2020-05-03 DIAGNOSIS — Z3A13 13 weeks gestation of pregnancy: Secondary | ICD-10-CM

## 2020-05-03 DIAGNOSIS — O285 Abnormal chromosomal and genetic finding on antenatal screening of mother: Secondary | ICD-10-CM | POA: Insufficient documentation

## 2020-05-03 LAB — POCT URINALYSIS DIPSTICK OB
Blood, UA: NEGATIVE
Glucose, UA: NEGATIVE
Ketones, UA: NEGATIVE
Nitrite, UA: NEGATIVE
POC,PROTEIN,UA: NEGATIVE

## 2020-05-03 NOTE — Progress Notes (Signed)
LOW-RISK PREGNANCY VISIT Patient name: Kathleen Grimes MRN [id number]  Date of birth: 08-27-2001 Chief Complaint:   Routine Prenatal Visit  History of Present Illness:   Kathleen Grimes is a 19 y.o. G1P0 female at 34w2dwith an Estimated Date of Delivery: 11/06/20 being seen today for ongoing management of a low-risk pregnancy.  [facility] 04/[facility]  Decreased Interest 3  Down, Depressed, Hopeless 0  PHQ - 2 Score 3  Altered sleeping 0  Tired, decreased energy 1  Change in appetite 2  Feeling bad or failure about yourself  0  Trouble concentrating 0  Moving slowly or fidgety/restless 1  Suicidal thoughts 0  PHQ-9 Score 7    Today she reports morning sickness is improving. Contractions: Not present. Vag. Bleeding: None.  Movement: Present. denies leaking of fluid. Review of Systems:   Pertinent items are noted in HPI Denies abnormal vaginal discharge w/ itching/odor/irritation, headaches, visual changes, shortness of breath, chest pain, abdominal pain, severe nausea/vomiting, or problems with urination or bowel movements unless otherwise stated above. Pertinent History Reviewed:  Reviewed past medical,surgical, social, obstetrical and family history.  Reviewed problem list, medications and allergies. Physical Assessment:   Vitals:   05/03/20 1404  BP: 108/72  Pulse: 95  Weight: 127 lb (57.6 kg)  Body mass index is 19.89 kg/m.        Physical Examination:   General appearance: Well appearing, and in no distress  Mental status: Alert, oriented to person, place, and time  Skin: Warm & dry  Cardiovascular: Normal heart rate noted  Respiratory: Normal respiratory effort, no distress  Abdomen: Soft, gravid, nontender  Pelvic: Cervical exam deferred         Extremities: Edema: None  Fetal Status:     Movement: Present    Chaperone: n/a    Results for orders placed or performed in visit on 05/03/20 (from the past 24 hour(s))  POC Urinalysis Dipstick OB   Collection Time: 05/03/20  2:06 PM  Result Value Ref Range   Color, UA     Clarity, UA     Glucose, UA Negative Negative   Bilirubin, UA     Ketones, UA neg    Spec Grav, UA     Blood, UA neg    pH, UA     POC,PROTEIN,UA Negative Negative, Trace, Small (1+), Moderate (2+), Large (3+), 4+   Urobilinogen, UA     Nitrite, UA neg    Leukocytes, UA Large (3+) (A) Negative   Appearance     Odor      Assessment & Plan:  1) Low-risk pregnancy G1P0 at 1110w2dith an Estimated Date of Delivery: 11/06/20   2) S/P laparoscopic cholecystectomy, 04/13/20   Meds: No orders of the defined types were placed in this encounter.  Labs/procedures today: CMP  Plan:  Continue routine obstetrical care  Next visit: prefers in person for sonogram   Reviewed: Preterm labor symptoms and general obstetric precautions including but not limited to vaginal bleeding, contractions, leaking of fluid and fetal movement were reviewed in detail with the patient.  All questions were answered. Has home bp cuff. Rx faxed to . Check bp weekly, let us[place]oreanow if >140/90.   Follow-up: Return in about 5 weeks (around 06/07/2020) for 20 week sono, LROB.  Orders Placed This Encounter  Procedures  . [place] Comp + 14 Wk  . Comp Met (CMET)  . POC Urinalysis Dipstick OB   LuFlorian Buff5/18/2021  2:24 PM

## 2020-05-04 ENCOUNTER — Encounter: Payer: Self-pay | Admitting: *Deleted

## 2020-05-04 LAB — COMPREHENSIVE METABOLIC PANEL
ALT: 76 IU/L — ABNORMAL HIGH (ref 0–32)
AST: 21 IU/L (ref 0–40)
Albumin/Globulin Ratio: 1.8 (ref 1.2–2.2)
Albumin: 3.9 g/dL (ref 3.9–5.0)
Alkaline Phosphatase: 82 IU/L (ref 45–106)
BUN/Creatinine Ratio: 4 — ABNORMAL LOW (ref 9–23)
BUN: 2 mg/dL — ABNORMAL LOW (ref 6–20)
Bilirubin Total: 0.3 mg/dL (ref 0.0–1.2)
CO2: 22 mmol/L (ref 20–29)
Calcium: 8.8 mg/dL (ref 8.7–10.2)
Chloride: 102 mmol/L (ref 96–106)
Creatinine, Ser: 0.45 mg/dL — ABNORMAL LOW (ref 0.57–1.00)
GFR calc Af Amer: 167 mL/min/{1.73_m2} (ref >59)
GFR calc non Af Amer: 145 mL/min/{1.73_m2} (ref >59)
Globulin, Total: 2.2 g/dL (ref 1.5–4.5)
Glucose: 86 mg/dL (ref 65–99)
Potassium: 4.2 mmol/L (ref 3.5–5.2)
Sodium: 135 mmol/L (ref 134–144)
Total Protein: 6.1 g/dL (ref 6.0–8.5)

## 2020-05-23 ENCOUNTER — Encounter (HOSPITAL_COMMUNITY): Payer: Self-pay | Admitting: *Deleted

## 2020-05-23 ENCOUNTER — Other Ambulatory Visit: Payer: Self-pay

## 2020-05-23 ENCOUNTER — Emergency Department (HOSPITAL_COMMUNITY)
Admission: EM | Admit: 2020-05-23 | Discharge: 2020-05-23 | Disposition: A | Discharge status: Home / Self Care | Payer: Medicaid Other | Attending: Emergency Medicine | Admitting: Emergency Medicine

## 2020-05-23 DIAGNOSIS — Z3A16 16 weeks gestation of pregnancy: Secondary | ICD-10-CM | POA: Insufficient documentation

## 2020-05-23 DIAGNOSIS — R1031 Right lower quadrant pain: Secondary | ICD-10-CM | POA: Diagnosis not present

## 2020-05-23 DIAGNOSIS — O219 Vomiting of pregnancy, unspecified: Secondary | ICD-10-CM | POA: Diagnosis present

## 2020-05-23 DIAGNOSIS — O211 Hyperemesis gravidarum with metabolic disturbance: Secondary | ICD-10-CM | POA: Insufficient documentation

## 2020-05-23 DIAGNOSIS — O210 Mild hyperemesis gravidarum: Secondary | ICD-10-CM

## 2020-05-23 LAB — CBC WITH DIFFERENTIAL/PLATELET
Abs Immature Granulocytes: 0.06 10*3/uL (ref 0.00–0.07)
Basophils Absolute: 0 10*3/uL (ref 0.0–0.1)
Basophils Relative: 0 %
Eosinophils Absolute: 0 10*3/uL (ref 0.0–0.5)
Eosinophils Relative: 0 %
HCT: 35.4 % — ABNORMAL LOW (ref 36.0–46.0)
Hemoglobin: 13.1 g/dL (ref 12.0–15.0)
Immature Granulocytes: 1 %
Lymphocytes Relative: 6 %
Lymphs Abs: 0.6 10*3/uL — ABNORMAL LOW (ref 0.7–4.0)
MCH: 33.5 pg (ref 26.0–34.0)
MCHC: 37 g/dL — ABNORMAL HIGH (ref 30.0–36.0)
MCV: 90.5 fL (ref 80.0–100.0)
Monocytes Absolute: 0.2 10*3/uL (ref 0.1–1.0)
Monocytes Relative: 2 %
Neutro Abs: 9.2 10*3/uL — ABNORMAL HIGH (ref 1.7–7.7)
Neutrophils Relative %: 91 %
Platelets: 279 10*3/uL (ref 150–400)
RBC: 3.91 MIL/uL (ref 3.87–5.11)
RDW: 13.2 % (ref 11.5–15.5)
WBC: 10.1 10*3/uL (ref 4.0–10.5)
nRBC: 0 % (ref 0.0–0.2)

## 2020-05-23 LAB — URINALYSIS, ROUTINE W REFLEX MICROSCOPIC
Bacteria, UA: NONE SEEN
Bilirubin Urine: NEGATIVE
Glucose, UA: >=500 mg/dL — AB
Hgb urine dipstick: NEGATIVE
Ketones, ur: 80 mg/dL — AB
Leukocytes,Ua: NEGATIVE
Nitrite: NEGATIVE
Protein, ur: 30 mg/dL — AB
Specific Gravity, Urine: 1.029 (ref 1.005–1.030)
pH: 6 (ref 5.0–8.0)

## 2020-05-23 LAB — BASIC METABOLIC PANEL
Anion gap: 13 (ref 5–15)
BUN: 5 mg/dL — ABNORMAL LOW (ref 6–20)
CO2: 19 mmol/L — ABNORMAL LOW (ref 22–32)
Calcium: 9.6 mg/dL (ref 8.9–10.3)
Chloride: 104 mmol/L (ref 98–111)
Creatinine, Ser: 0.46 mg/dL (ref 0.44–1.00)
GFR calc Af Amer: >60 mL/min (ref >60)
GFR calc non Af Amer: >60 mL/min (ref >60)
Glucose, Bld: 126 mg/dL — ABNORMAL HIGH (ref 70–99)
Potassium: 3.5 mmol/L (ref 3.5–5.1)
Sodium: 136 mmol/L (ref 135–145)

## 2020-05-23 LAB — HCG, QUANTITATIVE, PREGNANCY: hCG, Beta Chain, Quant, S: 28891 m[IU]/mL — ABNORMAL HIGH (ref <5)

## 2020-05-23 MED ORDER — ONDANSETRON HCL 4 MG/2ML IJ SOLN
4.0000 mg | Freq: Once | INTRAMUSCULAR | Status: AC
  Administered 2020-05-23: 4 mg via INTRAVENOUS
  Filled 2020-05-23: qty 2

## 2020-05-23 MED ORDER — SODIUM CHLORIDE 0.9 % IV BOLUS
1000.0000 mL | Freq: Once | INTRAVENOUS | Status: AC
  Administered 2020-05-23: 1000 mL via INTRAVENOUS

## 2020-05-23 MED ORDER — SODIUM CHLORIDE 0.9 % IV SOLN: INTRAVENOUS | Status: DC

## 2020-05-23 MED ORDER — ONDANSETRON 4 MG PO TBDP: 4.0000 mg | ORAL_TABLET | Freq: Three times a day (TID) | ORAL | 1 refills | Status: DC | PRN

## 2020-05-23 MED ORDER — DEXTROSE IN LACTATED RINGERS 5 % IV SOLN: INTRAVENOUS | Status: DC

## 2020-05-23 NOTE — Discharge Instructions (Signed)
Prescription for Zofran dissolvable provided.  Give [facility] to call tomorrow to let them know you are still struggling.  Return for any new or worse symptoms.

## 2020-05-23 NOTE — ED Notes (Signed)
ED Provider at bedside. 

## 2020-05-23 NOTE — ED Triage Notes (Signed)
Pt c/o n/v, abd pain, chills that started yesterday

## 2020-05-23 NOTE — ED Provider Notes (Signed)
Patient received 3 L of fluid feeling better.  Nausea little better controlled.  Patient did urinate recently.  So I think she is well-hydrated.  She will contact [facility] tomorrow.  Prescription for dissolvable Zofran provided.   Vanetta Mulders, MD 05/23/20 1446

## 2020-05-23 NOTE — ED Provider Notes (Signed)
[facility] EMERGENCY DEPARTMENT Provider Note   CSN: [id number] Arrival date & time: 05/23/20  0300   History Chief Complaint  Patient presents with  . Emesis    Kathleen Grimes is a 19 y.o. female.  The history is provided by the patient.  Emesis She is pregnant at [redacted] weeks gestation and comes in because of vomiting and abdominal pain.  Pregnancy has been complicated by nausea.  She has been taking doxylamine with pyridoxine, metoclopramide, and has a promethazine suppository.  Nausea and vomiting got worse at about 1 PM.  This was associated with pain in the right lower abdomen.  She rates pain at 8/10.  Nausea has not been affected by any of her medications.  She denies fever or chills.  She is gravida 1 para 0.  Past Medical History:  Diagnosis Date  . Abdominal migraine   . Allergic rhinitis 03/13/2013  . Allergy   . Anemia     Patient Active Problem List   Diagnosis Date Noted  . 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 Elevated LFTs 04/26/2020  . Total bilirubin, elevated 04/26/2020  . Supervision of normal first pregnancy 04/25/2020  . 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 Hx of cholecystectomy 04/25/2020  . Calculus of gallbladder without cholecystitis without obstruction 04/06/2020  . Abdominal migraine 02/22/2015  . 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 Allergic rhinitis 03/13/2013    Past Surgical History:  Procedure Laterality Date  . CHOLECYSTECTOMY N/A 04/13/2020   Procedure: LAPAROSCOPIC CHOLECYSTECTOMY;  Surgeon: Lucretia Roers, MD;  Location: [facility];  Service: General;  Laterality: N/A;  . WISDOM TOOTH EXTRACTION       OB History    Gravida  1   Para      Term      Preterm      AB      Living        SAB      TAB      Ectopic      Multiple      Live Births              Family History  Problem Relation Age of Onset  . Breast cancer Paternal Grandmother   . Cancer Maternal Great-grandmother        bone  marrow  . Colon cancer Neg Hx   . Esophageal cancer Neg Hx   . Stomach cancer Neg Hx     Social History   Tobacco Use  . Smoking status: Never Smoker  . Smokeless tobacco: Never Used  Substance Use Topics  . Alcohol use: No  . Drug use: Yes    Types: Marijuana    Comment: 2 grams    Home Medications Prior to Admission medications   Medication Sig Start Date End Date Taking? Authorizing Provider  Blood Pressure Monitor MISC For regular home bp monitoring during pregnancy 04/25/20   Cheral Marker, CNM  docusate sodium (COLACE) 100 MG capsule Take 1 capsule (100 mg total) by mouth 2 (two) times daily as needed (constipation related to narcotic use). Patient not taking: Reported on 04/25/2020 04/13/20 04/13/21  Lucretia Roers, MD  Doxylamine-Pyridoxine (DICLEGIS) 10-10 MG TBEC 2 tabs q hs, if sx persist add 1 tab q am on day 3, if sx persist add 1 tab q afternoon on day 4 04/25/20   Cheral Marker, CNM  metoCLOPramide (REGLAN) 10 MG tablet Take 1 tablet (10 mg total) by mouth every 6 (six)  hours. Patient not taking: Reported on 04/25/2020 04/06/20   Mannie Stabile, PA-C  Prenatal Vit-Fe Fumarate-FA (PRENATAL MULTIVITAMIN) TABS tablet Take 2 tablets by mouth daily at 12 noon. Gummies    [provider]  promethazine (PHENERGAN) 25 MG suppository Place 1 suppository (25 mg total) rectally every 6 (six) hours as needed for nausea or vomiting. 04/25/20   Cheral Marker, CNM    Allergies    Hydrocodone  Review of Systems   Review of Systems  Gastrointestinal: Positive for vomiting.  All other systems reviewed and are negative.   Physical Exam Updated Vital Signs BP 125/84   Pulse 95   Temp 98.2 F (36.8 C) (Oral)   Resp 18   Ht 5\' 7"  (1.702 m)   Wt 56.7 kg   LMP 02/07/2020   SpO2 100%   BMI 19.58 kg/m   Physical Exam Vitals and nursing note reviewed.   19 year old female, resting comfortably and in no acute distress. Vital signs are normal.  Oxygen saturation is 100%, which is normal. Head is normocephalic and atraumatic. PERRLA, EOMI. Oropharynx is clear. Neck is nontender and supple without adenopathy or JVD. Back is nontender and there is no CVA tenderness. Lungs are clear without rales, wheezes, or rhonchi. Chest is nontender. Heart has regular rate and rhythm without murmur. Abdomen is soft, flat, nontender.  Uterine fundus is palpable and consistent with [redacted] weeks gestation and is nontender.  There are no other masses or hepatosplenomegaly and peristalsis is slightly hypoactive. Extremities have no cyanosis or edema, full range of motion is present. Skin is warm and dry without rash. Neurologic: Mental status is normal, cranial nerves are intact, there are no motor or sensory deficits.  ED Results / Procedures / Treatments   Labs (all labs ordered are listed, but only abnormal results are displayed) Labs Reviewed  BASIC METABOLIC PANEL - Abnormal; Notable for the following components:      Result Value   CO2 19 (*)    Glucose, Bld 126 (*)    BUN 5 (*)    All other components within normal limits  CBC WITH DIFFERENTIAL/PLATELET - Abnormal; Notable for the following components:   HCT 35.4 (*)    MCHC 37.0 (*)    Neutro Abs 9.2 (*)    Lymphs Abs 0.6 (*)    All other components within normal limits  HCG, QUANTITATIVE, PREGNANCY - Abnormal; Notable for the following components:   hCG, Beta Chain, Quant, S 28,891 (*)    All other components within normal limits  URINALYSIS, ROUTINE W REFLEX MICROSCOPIC   Procedures Procedures  Medications Ordered in ED Medications  dextrose 5 % in lactated ringers infusion ( Intravenous New Bag/Given 05/23/20 0625)  ondansetron (ZOFRAN) injection 4 mg (has no administration in time range)    ED Course  I have reviewed the triage vital signs and the nursing notes.  Pertinent lab results that were available during my care of the patient were reviewed by me and considered in my  medical decision making (see chart for details).  MDM Rules/Calculators/A&P Vomiting and abdominal pain and patient is in second trimester pregnancy.  Exam is benign, will treat for hyperemesis gravidarum with IV fluids and ondansetron.  Old records are reviewed showing a hospital admission 1 month ago for intractable nausea and vomiting.  Labs are unremarkable.  CO2 is slightly low but with normal anion gap.  hCG and urinalysis are pending.  Case is signed out to Dr. 07/23/20.  Final Clinical Impression(s) / ED Diagnoses Final diagnoses:  Hyperemesis gravidarum    Rx / DC Orders ED Discharge Orders    None       Delora Fuel, MD 91/36/85 [phone]

## 2020-05-23 NOTE — ED Notes (Signed)
Pt reports she is now [redacted]wks pregnant and has tried her prescribed medications including suppository at home without relief of symptoms.

## 2020-06-06 ENCOUNTER — Other Ambulatory Visit: Payer: Self-pay | Admitting: Obstetrics & Gynecology

## 2020-06-06 DIAGNOSIS — Z3401 Encounter for supervision of normal first pregnancy, first trimester: Secondary | ICD-10-CM

## 2020-06-06 DIAGNOSIS — Z363 Encounter for antenatal screening for malformations: Secondary | ICD-10-CM

## 2020-06-07 ENCOUNTER — Ambulatory Visit (INDEPENDENT_AMBULATORY_CARE_PROVIDER_SITE_OTHER): Payer: Medicaid Other | Admitting: Women's Health

## 2020-06-07 ENCOUNTER — Ambulatory Visit (INDEPENDENT_AMBULATORY_CARE_PROVIDER_SITE_OTHER): Payer: Medicaid Other

## 2020-06-07 ENCOUNTER — Encounter: Payer: Self-pay | Admitting: Women's Health

## 2020-06-07 ENCOUNTER — Other Ambulatory Visit: Payer: Self-pay

## 2020-06-07 VITALS — BP 105/1 | HR 58 | Wt 135.0 lb

## 2020-06-07 DIAGNOSIS — Z331 Pregnant state, incidental: Secondary | ICD-10-CM

## 2020-06-07 DIAGNOSIS — Z3A18 18 weeks gestation of pregnancy: Secondary | ICD-10-CM

## 2020-06-07 DIAGNOSIS — Z3402 Encounter for supervision of normal first pregnancy, second trimester: Secondary | ICD-10-CM | POA: Diagnosis not present

## 2020-06-07 DIAGNOSIS — Z3401 Encounter for supervision of normal first pregnancy, first trimester: Secondary | ICD-10-CM

## 2020-06-07 DIAGNOSIS — F1290 Cannabis use, unspecified, uncomplicated: Secondary | ICD-10-CM

## 2020-06-07 DIAGNOSIS — Z363 Encounter for antenatal screening for malformations: Secondary | ICD-10-CM | POA: Diagnosis not present

## 2020-06-07 DIAGNOSIS — Z1389 Encounter for screening for other disorder: Secondary | ICD-10-CM

## 2020-06-07 DIAGNOSIS — Z1379 Encounter for other screening for genetic and chromosomal anomalies: Secondary | ICD-10-CM

## 2020-06-07 LAB — POCT URINALYSIS DIPSTICK OB
Blood, UA: NEGATIVE
Glucose, UA: NEGATIVE
Ketones, UA: NEGATIVE
Leukocytes, UA: NEGATIVE
Nitrite, UA: NEGATIVE
POC,PROTEIN,UA: NEGATIVE

## 2020-06-07 NOTE — Progress Notes (Signed)
LOW-RISK PREGNANCY VISIT Patient name: Kathleen Grimes MRN [id number]  Date of birth: 2001/06/20 Chief Complaint:   Routine Prenatal Visit  History of Present Illness:   Kathleen Grimes is a 19 y.o. G1P0 female at [redacted]w[redacted]d with an Estimated Date of Delivery: 11/06/20 being seen today for ongoing management of a low-risk pregnancy.  Depression screen [facility] 2/9 04/25/2020  Decreased Interest 3  Down, Depressed, Hopeless 0  PHQ - 2 Score 3  Altered sleeping 0  Tired, decreased energy 1  Change in appetite 2  Feeling bad or failure about yourself  0  Trouble concentrating 0  Moving slowly or fidgety/restless 1  Suicidal thoughts 0  PHQ-9 Score 7    Today she reports no complaints. Feels much better since cholecystectomy. No longer getting sick.Quit smoking THC about 69mth ago! Contractions: Not present. Vag. Bleeding: None.  Movement: Present. denies leaking of fluid. Review of Systems:   Pertinent items are noted in HPI Denies abnormal vaginal discharge w/ itching/odor/irritation, headaches, visual changes, shortness of breath, chest pain, abdominal pain, severe nausea/vomiting, or problems with urination or bowel movements unless otherwise stated above. Pertinent History Reviewed:  Reviewed past medical,surgical, social, obstetrical and family history.  Reviewed problem list, medications and allergies. Physical Assessment:   Vitals:   06/07/20 1538  BP: (!) 105/1  Pulse: (!) 58  Weight: 135 lb (61.2 kg)  Body mass index is 21.14 kg/m.        Physical Examination:   General appearance: Well appearing, and in no distress  Mental status: Alert, oriented to person, place, and time  Skin: Warm & dry  Cardiovascular: Normal heart rate noted  Respiratory: Normal respiratory effort, no distress  Abdomen: Soft, gravid, nontender  Pelvic: Cervical exam deferred         Extremities: Edema: None  Fetal Status: Fetal Heart Rate (bpm): 154 u/s   Movement: Present   [name] 88+8  wks,cephalic,anterior placenta gr 0,cx 2.7 cm,normal ovaries, fhr 154 bpm,svp of fluid 4 cm,efw 254 g 70%,anatomy complete,no obvious abnormalities    Chaperone: n/a    Results for orders placed or performed in visit on 06/07/20 (from the past 24 hour(s))  POC Urinalysis Dipstick OB   Collection Time: 06/07/20  3:32 PM  Result Value Ref Range   Color, UA     Clarity, UA     Glucose, UA Negative Negative   Bilirubin, UA     Ketones, UA n    Spec Grav, UA     Blood, UA n    pH, UA     POC,PROTEIN,UA Negative Negative, Trace, Small (1+), Moderate (2+), Large (3+), 4+   Urobilinogen, UA     Nitrite, UA n    Leukocytes, UA Negative Negative   Appearance     Odor      Assessment & Plan:  1) Low-risk pregnancy G1P0 at [redacted]w[redacted]d with an Estimated Date of Delivery: 11/06/20    Meds: No orders of the defined types were placed in this encounter.  Labs/procedures today: 2nd IT  Plan:  Continue routine obstetrical care  Next visit: prefers online    Reviewed: Preterm labor symptoms and general obstetric precautions including but not limited to vaginal bleeding, contractions, leaking of fluid and fetal movement were reviewed in detail with the patient.  All questions were answered. Has home bp cuff.  Check bp weekly, let us know if >140/90.   Follow-up: Return in about 4 weeks (around 07/05/2020) for LROB, CNM, MyChart Video.  Orders  Placed This Encounter  Procedures  . INTEGRATED 2  . POC Urinalysis Dipstick OB   Cheral Marker CNM, [facility] 06/07/2020 4:08 PM

## 2020-06-07 NOTE — Patient Instructions (Signed)
Kathleen Grimes, I greatly value your feedback.  If you receive a survey following your visit with [name] today, we appreciate you taking the time to fill it out.  Thanks, [name], CNM, WHNP-BC  [facility] ([address] [place], [place] [address]) Entrance C, located off of [address] valet parking  Go to [email or web address] to register for FREE online childbirth classes  [place] Pediatricians/Family Doctors:  [facility] [phone]            [facility] [phone]                 [facility] [phone] (usually not accepting new patients unless you have family there already, you are always welcome to call and ask)       [facility] Department [phone]       [facility] Pediatricians/Family Doctors:   [facility]: [phone]  [facility]: [phone]  [facility]: [phone]  [facility] Doctors:   [facility]: [phone]   [facility]: [phone]  [facility] Doctors:  [facility]: [phone]    Home Blood Pressure Monitoring for Patients   Your provider has recommended that you check your blood pressure (BP) at least once a week at home. If you do not have a blood pressure cuff at home, one will be provided for you. Contact your provider if you have not received your monitor within 1 week.   Helpful Tips for Accurate Home Blood Pressure Checks   Don't smoke, exercise, or drink caffeine 30 minutes before checking your BP  Use the restroom before checking your BP (a full bladder can raise your pressure)  Relax in a comfortable upright chair  Feet on the ground  Left arm resting comfortably on a flat surface at the level of your heart  Legs uncrossed  Back supported  Sit quietly and don't talk  Place the cuff on your bare arm  Adjust snuggly, so  that only two fingertips can fit between your skin and the top of the cuff  Check 2 readings separated by at least one minute  Keep a log of your BP readings  For a visual, please reference this diagram: http://ccnc.care/bpdiagram  Provider Name: [facility]     Phone: [phone]  Zone 1: ALL CLEAR  Continue to monitor your symptoms:   BP reading is less than 140 (top number) or less than 90 (bottom number)   No right upper stomach pain  No headaches or seeing spots  No feeling nauseated or throwing up  No swelling in face and hands  Zone 2: CAUTION Call your doctor's office for any of the following:   BP reading is greater than 140 (top number) or greater than 90 (bottom number)   Stomach pain under your ribs in the middle or right side  Headaches or seeing spots  Feeling nauseated or throwing up  Swelling in face and hands  Zone 3: EMERGENCY  Seek immediate medical care if you have any of the following:   BP reading is greater than160 (top number) or greater than 110 (bottom number)  Severe headaches not improving with Tylenol  Serious difficulty catching your breath  Any worsening symptoms from Zone 2     Second Trimester of Pregnancy The second trimester is from week 14 through week 27 (months 4 through 6). The second trimester is often a time when you feel your  best. Your body has adjusted to being pregnant, and you begin to feel better physically. Usually, morning sickness has lessened or quit completely, you may have more energy, and you may have an increase in appetite. The second trimester is also a time when the fetus is growing rapidly. At the end of the sixth month, the fetus is about 9 inches long and weighs about 1 pounds. You will likely begin to feel the baby move (quickening) between 16 and 20 weeks of pregnancy. Body changes during your second trimester Your body continues to go through many changes during your second trimester. The  changes vary from woman to woman.  Your weight will continue to increase. You will notice your lower abdomen bulging out.  You may begin to get stretch marks on your hips, abdomen, and breasts.  You may develop headaches that can be relieved by medicines. The medicines should be approved by your health care provider.  You may urinate more often because the fetus is pressing on your bladder.  You may develop or continue to have heartburn as a result of your pregnancy.  You may develop constipation because certain hormones are causing the muscles that push waste through your intestines to slow down.  You may develop hemorrhoids or swollen, bulging veins (varicose veins).  You may have back pain. This is caused by: ? Weight gain. ? Pregnancy hormones that are relaxing the joints in your pelvis. ? A shift in weight and the muscles that support your balance.  Your breasts will continue to grow and they will continue to become tender.  Your gums may bleed and may be sensitive to brushing and flossing.  Dark spots or blotches (chloasma, mask of pregnancy) may develop on your face. This will likely fade after the baby is born.  A dark line from your belly button to the pubic area (linea nigra) may appear. This will likely fade after the baby is born.  You may have changes in your hair. These can include thickening of your hair, rapid growth, and changes in texture. Some women also have hair loss during or after pregnancy, or hair that feels dry or thin. Your hair will most likely return to normal after your baby is born.  What to expect at prenatal visits During a routine prenatal visit:  You will be weighed to make sure you and the fetus are growing normally.  Your blood pressure will be taken.  Your abdomen will be measured to track your baby's growth.  The fetal heartbeat will be listened to.  Any test results from the previous visit will be discussed.  Your health care  provider may ask you:  How you are feeling.  If you are feeling the baby move.  If you have had any abnormal symptoms, such as leaking fluid, bleeding, severe headaches, or abdominal cramping.  If you are using any tobacco products, including cigarettes, chewing tobacco, and electronic cigarettes.  If you have any questions.  Other tests that may be performed during your second trimester include:  Blood tests that check for: ? Low iron levels (anemia). ? High blood sugar that affects pregnant women (gestational diabetes) between 52 and 28 weeks. ? Rh antibodies. This is to check for a protein on red blood cells (Rh factor).  Urine tests to check for infections, diabetes, or protein in the urine.  An ultrasound to confirm the proper growth and development of the baby.  An amniocentesis to check for possible genetic problems.  Fetal  screens for spina bifida and Down syndrome.  HIV (human immunodeficiency virus) testing. Routine prenatal testing includes screening for HIV, unless you choose not to have this test.  Follow these instructions at home: Medicines  Follow your health care provider's instructions regarding medicine use. Specific medicines may be either safe or unsafe to take during pregnancy.  Take a prenatal vitamin that contains at least 600 micrograms (mcg) of folic acid.  If you develop constipation, try taking a stool softener if your health care provider approves. Eating and drinking  Eat a balanced diet that includes fresh fruits and vegetables, whole grains, good sources of protein such as meat, eggs, or tofu, and low-fat dairy. Your health care provider will help you determine the amount of weight gain that is right for you.  Avoid raw meat and uncooked cheese. These carry germs that can cause birth defects in the baby.  If you have low calcium intake from food, talk to your health care provider about whether you should take a daily calcium  supplement.  Limit foods that are high in fat and processed sugars, such as fried and sweet foods.  To prevent constipation: ? Drink enough fluid to keep your urine clear or pale yellow. ? Eat foods that are high in fiber, such as fresh fruits and vegetables, whole grains, and beans. Activity  Exercise only as directed by your health care provider. Most women can continue their usual exercise routine during pregnancy. Try to exercise for 30 minutes at least 5 days a week. Stop exercising if you experience uterine contractions.  Avoid heavy lifting, wear low heel shoes, and practice good posture.  A sexual relationship may be continued unless your health care provider directs you otherwise. Relieving pain and discomfort  Wear a good support bra to prevent discomfort from breast tenderness.  Take warm sitz baths to soothe any pain or discomfort caused by hemorrhoids. Use hemorrhoid cream if your health care provider approves.  Rest with your legs elevated if you have leg cramps or low back pain.  If you develop varicose veins, wear support hose. Elevate your feet for 15 minutes, 3-4 times a day. Limit salt in your diet. Prenatal Care  Write down your questions. Take them to your prenatal visits.  Keep all your prenatal visits as told by your health care provider. This is important. Safety  Wear your seat belt at all times when driving.  Make a list of emergency phone numbers, including numbers for family, friends, the hospital, and police and fire departments. General instructions  Ask your health care provider for a referral to a local prenatal education class. Begin classes no later than the beginning of month 6 of your pregnancy.  Ask for help if you have counseling or nutritional needs during pregnancy. Your health care provider can offer advice or refer you to specialists for help with various needs.  Do not use hot tubs, steam rooms, or saunas.  Do not douche or use  tampons or scented sanitary pads.  Do not cross your legs for long periods of time.  Avoid cat litter boxes and soil used by cats. These carry germs that can cause birth defects in the baby and possibly loss of the fetus by miscarriage or stillbirth.  Avoid all smoking, herbs, alcohol, and unprescribed drugs. Chemicals in these products can affect the formation and growth of the baby.  Do not use any products that contain nicotine or tobacco, such as cigarettes and e-cigarettes. If you need help  quitting, ask your health care provider.  Visit your dentist if you have not gone yet during your pregnancy. Use a soft toothbrush to brush your teeth and be gentle when you floss. Contact a health care provider if:  You have dizziness.  You have mild pelvic cramps, pelvic pressure, or nagging pain in the abdominal area.  You have persistent nausea, vomiting, or diarrhea.  You have a bad smelling vaginal discharge.  You have pain when you urinate. Get help right away if:  You have a fever.  You are leaking fluid from your vagina.  You have spotting or bleeding from your vagina.  You have severe abdominal cramping or pain.  You have rapid weight gain or weight loss.  You have shortness of breath with chest pain.  You notice sudden or extreme swelling of your face, hands, ankles, feet, or legs.  You have not felt your baby move in over an hour.  You have severe headaches that do not go away when you take medicine.  You have vision changes. Summary  The second trimester is from week 14 through week 27 (months 4 through 6). It is also a time when the fetus is growing rapidly.  Your body goes through many changes during pregnancy. The changes vary from woman to woman.  Avoid all smoking, herbs, alcohol, and unprescribed drugs. These chemicals affect the formation and growth your baby.  Do not use any tobacco products, such as cigarettes, chewing tobacco, and e-cigarettes. If you  need help quitting, ask your health care provider.  Contact your health care provider if you have any questions. Keep all prenatal visits as told by your health care provider. This is important. This information is not intended to replace advice given to you by your health care provider. Make sure you discuss any questions you have with your health care provider. Document Released: 11/27/2001 Document Revised: 05/10/2016 Document Reviewed: 02/03/2013 Elsevier Interactive Patient Education  2017 [place] FLU! Because you are pregnant, we at [facility], along with the Centers for Disease Control (CDC), recommend that you receive the flu vaccine to protect yourself and your baby from the flu. The flu is more likely to cause severe illness in pregnant women than in women of reproductive age who are not pregnant. Changes in the immune system, heart, and lungs during pregnancy make pregnant women (and women up to two weeks postpartum) more prone to severe illness from flu, including illness resulting in hospitalization. Flu also may be harmful for a pregnant womans developing baby. A common flu symptom is fever, which may be associated with neural tube defects and other adverse outcomes for a developing baby. Getting vaccinated can also help protect a baby after birth from flu. (Mom passes antibodies onto the developing baby during her pregnancy.)  A Flu Vaccine is the Best Protection Against Flu Getting a flu vaccine is the first and most important step in protecting against flu. Pregnant women should get a flu shot and not the live attenuated influenza vaccine (LAIV), also known as nasal spray flu vaccine. Flu vaccines given during pregnancy help protect both the mother and her baby from flu. Vaccination has been shown to reduce the risk of flu-associated acute respiratory infection in pregnant women by up to one-half. A 2018 study showed that getting a flu shot  reduced a pregnant womans risk of being hospitalized with flu by an average of 40 percent. Pregnant women who get a  flu vaccine are also helping to protect their babies from flu illness for the first several months after their birth, when they are too young to get vaccinated.   A Long Record of Safety for Flu Shots in Pregnant Women Flu shots have been given to millions of pregnant women over many years with a good safety record. There is a lot of evidence that flu vaccines can be given safely during pregnancy; though these data are limited for the first trimester. The CDC recommends that pregnant women get vaccinated during any trimester of their pregnancy. It is very important for pregnant women to get the flu shot.   Other Preventive Actions In addition to getting a flu shot, pregnant women should take the same everyday preventive actions the CDC recommends of everyone, including covering coughs, washing hands often, and avoiding people who are sick.  Symptoms and Treatment If you get sick with flu symptoms call your doctor right away. There are antiviral drugs that can treat flu illness and prevent serious flu complications. The CDC recommends prompt treatment for people who have influenza infection or suspected influenza infection and who are at high risk of serious flu complications, such as people with asthma, diabetes (including gestational diabetes), or heart disease. Early treatment of influenza in hospitalized pregnant women has been shown to reduce the length of the hospital stay.  Symptoms Flu symptoms include fever, cough, sore throat, runny or stuffy nose, body aches, headache, chills and fatigue. Some people may also have vomiting and diarrhea. People may be infected with the flu and have respiratory symptoms without a fever.  Early Treatment is Important for Pregnant Women Treatment should begin as soon as possible because antiviral drugs work best when started early (within 48 hours  after symptoms start). Antiviral drugs can make your flu illness milder and make you feel better faster. They may also prevent serious health problems that can result from flu illness. Oral oseltamivir (Tamiflu) is the preferred treatment for pregnant women because it has the most studies available to suggest that it is safe and beneficial. Antiviral drugs require a prescription from your provider. Having a fever caused by flu infection or other infections early in pregnancy may be linked to birth defects in a baby. In addition to taking antiviral drugs, pregnant women who get a fever should treat their fever with Tylenol (acetaminophen) and contact their provider immediately.  When to [place] If you are pregnant and have any of these signs, seek care immediately:  Difficulty breathing or shortness of breath  Pain or pressure in the chest or abdomen  Sudden dizziness  Confusion  Severe or persistent vomiting  High fever that is not responding to Tylenol (or store brand equivalent)  Decreased or no movement of your baby  [email or web address]

## 2020-06-07 NOTE — Progress Notes (Signed)
[name] 18+2 wks,cephalic,anterior placenta gr 0,cx 2.7 cm,normal ovaries, fhr 154 bpm,svp of fluid 4 cm,efw 254 g 70%,anatomy complete,no obvious abnormalities

## 2020-06-09 LAB — INTEGRATED 2
AFP MoM: 0.65
Alpha-Fetoprotein: 32.4 ng/mL
Crown Rump Length: 57 mm
DIA MoM: 1.03
DIA Value: 186.8 pg/mL
Estriol, Unconjugated: 1.73 ng/mL
Gest. Age on Collection Date: 12.1 weeks
Gestational Age: 18.3 weeks
Maternal Age at EDD: 20.8 yr
Nuchal Translucency (NT): 1.4 mm
Nuchal Translucency MoM: 1.1
Number of Fetuses: 1
PAPP-A MoM: 1.01
PAPP-A Value: 1120.8 ng/mL
Test Results:: NEGATIVE
Weight: 122 [lb_av]
Weight: 122 [lb_av]
hCG MoM: 0.76
hCG Value: 20.7 IU/mL
uE3 MoM: 1.09

## 2020-06-14 ENCOUNTER — Telehealth: Payer: Self-pay | Admitting: Women's Health

## 2020-06-14 NOTE — Telephone Encounter (Signed)
Patient is having sinus congestion and wants to know what she can take for symptoms patient states shes almost [redacted] weeks pregnant.

## 2020-06-21 ENCOUNTER — Other Ambulatory Visit: Payer: Self-pay

## 2020-06-21 ENCOUNTER — Encounter (HOSPITAL_COMMUNITY): Payer: Self-pay | Admitting: Obstetrics and Gynecology

## 2020-06-21 ENCOUNTER — Emergency Department (HOSPITAL_COMMUNITY): Payer: Medicaid Other

## 2020-06-21 ENCOUNTER — Inpatient Hospital Stay (HOSPITAL_COMMUNITY)
Admission: EM | Admit: 2020-06-21 | Discharge: 2020-06-21 | Disposition: A | Discharge status: Home / Self Care | Payer: Medicaid Other | Attending: Obstetrics and Gynecology | Admitting: Obstetrics and Gynecology

## 2020-06-21 DIAGNOSIS — Y929 Unspecified place or not applicable: Secondary | ICD-10-CM | POA: Diagnosis not present

## 2020-06-21 DIAGNOSIS — O9A212 Injury, poisoning and certain other consequences of external causes complicating pregnancy, second trimester: Secondary | ICD-10-CM | POA: Diagnosis not present

## 2020-06-21 DIAGNOSIS — S8011XA Contusion of right lower leg, initial encounter: Secondary | ICD-10-CM

## 2020-06-21 DIAGNOSIS — Y939 Activity, unspecified: Secondary | ICD-10-CM | POA: Diagnosis not present

## 2020-06-21 DIAGNOSIS — Z3A20 20 weeks gestation of pregnancy: Secondary | ICD-10-CM | POA: Diagnosis not present

## 2020-06-21 DIAGNOSIS — Y999 Unspecified external cause status: Secondary | ICD-10-CM | POA: Insufficient documentation

## 2020-06-21 MED ORDER — SODIUM CHLORIDE 0.9 % IV BOLUS
1000.0000 mL | Freq: Once | INTRAVENOUS | Status: AC
  Administered 2020-06-21: 1000 mL via INTRAVENOUS

## 2020-06-21 MED ORDER — ACETAMINOPHEN 500 MG PO TABS
1000.0000 mg | ORAL_TABLET | Freq: Once | ORAL | Status: AC
  Administered 2020-06-21: 1000 mg via ORAL
  Filled 2020-06-21: qty 2
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
## 2020-06-21 NOTE — ED Notes (Signed)
Help get patient undress on the monitor patient is resting with call bell in reach 

## 2020-06-21 NOTE — Discharge Instructions (Addendum)
Return to MAU if any cramping or vaginal bleeding occurs.
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
## 2020-06-22 ENCOUNTER — Encounter: Payer: Self-pay | Admitting: *Deleted

## 2020-06-22 ENCOUNTER — Telehealth: Payer: Self-pay | Admitting: *Deleted

## 2020-06-22 ENCOUNTER — Telehealth: Payer: Self-pay | Admitting: Women's Health

## 2020-06-22 ENCOUNTER — Encounter: Payer: Self-pay | Admitting: Women's Health
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
## 2020-07-05 ENCOUNTER — Encounter: Payer: Medicaid Other | Admitting: Women's Health

## 2020-07-05 ENCOUNTER — Encounter: Payer: Self-pay | Admitting: *Deleted

## 2020-07-06 NOTE — Progress Notes (Signed)
Pt cancelled appt. This encounter was created in error - please disregard. 

## 2020-07-11 ENCOUNTER — Ambulatory Visit (INDEPENDENT_AMBULATORY_CARE_PROVIDER_SITE_OTHER): Payer: Medicaid Other | Admitting: Advanced Practice Midwife

## 2020-07-11 ENCOUNTER — Encounter: Payer: Self-pay | Admitting: Advanced Practice Midwife

## 2020-07-11 VITALS — BP 105/65 | HR 82 | Wt 151.0 lb

## 2020-07-11 DIAGNOSIS — Z331 Pregnant state, incidental: Secondary | ICD-10-CM

## 2020-07-11 DIAGNOSIS — R892 Abnormal level of other drugs, medicaments and biological substances in specimens from other organs, systems and tissues: Secondary | ICD-10-CM

## 2020-07-11 DIAGNOSIS — O99322 Drug use complicating pregnancy, second trimester: Secondary | ICD-10-CM

## 2020-07-11 DIAGNOSIS — Z3A23 23 weeks gestation of pregnancy: Secondary | ICD-10-CM

## 2020-07-11 DIAGNOSIS — F1290 Cannabis use, unspecified, uncomplicated: Secondary | ICD-10-CM

## 2020-07-11 DIAGNOSIS — Z3402 Encounter for supervision of normal first pregnancy, second trimester: Secondary | ICD-10-CM

## 2020-07-11 DIAGNOSIS — Z1389 Encounter for screening for other disorder: Secondary | ICD-10-CM

## 2020-07-11 LAB — POCT URINALYSIS DIPSTICK OB
Blood, UA: NEGATIVE
Glucose, UA: NEGATIVE
Ketones, UA: NEGATIVE
Nitrite, UA: NEGATIVE
POC,PROTEIN,UA: NEGATIVE
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
## 2020-07-11 NOTE — Patient Instructions (Signed)
Kathleen Grimes, I greatly value your feedback.  If you receive a survey following your visit with [name] today, we appreciate you taking the time to fill it out.  Thanks, Philipp Deputy, CNM   You will have your sugar test next visit.  Please do not eat or drink anything after midnight the night before you come, not even water.  You will be here for at least two hours.  Please make an appointment online for the bloodwork at [email or web address] for 8:30am (or as close to this as possible). Make sure you select the [facility] service center. The day of the appointment, check in with our office first, then you will go to [facility] to start the sugar test.    [facility] HAS MOVED!!! It is now [facility] ([address] [place], [place] [address]) Entrance C, located off of [address] valet parking  Go to [email or web address] to register for FREE online childbirth classes   Call the office [phone]) or go to [facility] if:  You begin to have strong, frequent contractions  Your water breaks.  Sometimes it is a big gush of fluid, sometimes it is just a trickle that keeps getting your panties wet or running down your legs  You have vaginal bleeding.  It is normal to have a small amount of spotting if your cervix was checked.   You don't feel your baby moving like normal.  If you don't, get you something to eat and drink and lay down and focus on feeling your baby move.   If your baby is still not moving like normal, you should call the office or go to [facility].  [place] Pediatricians/Family Doctors:  [facility] [phone]            [facility] [phone]                 [facility] [phone] (usually not accepting new patients unless you have family there already, you are always welcome to call and ask)       [facility] Department [phone]       [facility] Pediatricians/Family Doctors:    [facility]: [phone]  [facility]: [phone]  [facility]: [phone]  [facility] Doctors:   [facility]: [phone]   [facility]: [phone]  [facility] Doctors:  [facility]: [phone]   Home Blood Pressure Monitoring for Patients   Your provider has recommended that you check your blood pressure (BP) at least once a week at home. If you do not have a blood pressure cuff at home, one will be provided for you. Contact your provider if you have not received your monitor within 1 week.   Helpful Tips for Accurate Home Blood Pressure Checks  . Don't smoke, exercise, or drink caffeine 30 minutes before checking your BP . Use the restroom before checking your BP (a full bladder can raise your pressure) . Relax in a comfortable upright chair . Feet on the ground . Left arm resting comfortably on a flat surface at the level of your heart . Legs uncrossed . Back supported . Sit quietly and don't talk . Place the cuff on your bare arm . Adjust snuggly, so that only two fingertips can fit between your skin and the top of the cuff . Check 2 readings separated by at least one minute . Keep a log of your BP  readings . For a visual, please reference this diagram: http://ccnc.care/bpdiagram  Provider Name: [facility]     Phone: [phone]  Zone 1: ALL CLEAR  Continue to monitor your symptoms:  . BP reading is less than 140 (top number) or less than 90 (bottom number)  . No right upper stomach pain . No headaches or seeing spots . No feeling nauseated or throwing up . No swelling in face and hands  Zone 2: CAUTION Call your doctor's office for any of the following:  . BP reading is greater than 140 (top number) or greater than 90 (bottom number)  . Stomach pain under your ribs in the middle or right side . Headaches or seeing spots . Feeling  nauseated or throwing up . Swelling in face and hands  Zone 3: EMERGENCY  Seek immediate medical care if you have any of the following:  . BP reading is greater than160 (top number) or greater than 110 (bottom number) . Severe headaches not improving with Tylenol . Serious difficulty catching your breath . Any worsening symptoms from Zone 2   Second Trimester of Pregnancy The second trimester is from week 13 through week 28, months 4 through 6. The second trimester is often a time when you feel your best. Your body has also adjusted to being pregnant, and you begin to feel better physically. Usually, morning sickness has lessened or quit completely, you may have more energy, and you may have an increase in appetite. The second trimester is also a time when the fetus is growing rapidly. At the end of the sixth month, the fetus is about 9 inches long and weighs about 1 pounds. You will likely begin to feel the baby move (quickening) between 18 and 20 weeks of the pregnancy. BODY CHANGES Your body goes through many changes during pregnancy. The changes vary from woman to woman.   Your weight will continue to increase. You will notice your lower abdomen bulging out.  You may begin to get stretch marks on your hips, abdomen, and breasts.  You may develop headaches that can be relieved by medicines approved by your health care provider.  You may urinate more often because the fetus is pressing on your bladder.  You may develop or continue to have heartburn as a result of your pregnancy.  You may develop constipation because certain hormones are causing the muscles that push waste through your intestines to slow down.  You may develop hemorrhoids or swollen, bulging veins (varicose veins).  You may have back pain because of the weight gain and pregnancy hormones relaxing your joints between the bones in your pelvis and as a result of a shift in weight and the muscles that support your  balance.  Your breasts will continue to grow and be tender.  Your gums may bleed and may be sensitive to brushing and flossing.  Dark spots or blotches (chloasma, mask of pregnancy) may develop on your face. This will likely fade after the baby is born.  A dark line from your belly button to the pubic area (linea nigra) may appear. This will likely fade after the baby is born.  You may have changes in your hair. These can include thickening of your hair, rapid growth, and changes in texture. Some women also have hair loss during or after pregnancy, or hair that feels dry or thin. Your hair will most likely return to normal after your baby is born. WHAT TO EXPECT AT YOUR PRENATAL VISITS During  a routine prenatal visit:  You will be weighed to make sure you and the fetus are growing normally.  Your blood pressure will be taken.  Your abdomen will be measured to track your baby's growth.  The fetal heartbeat will be listened to.  Any test results from the previous visit will be discussed. Your health care provider may ask you:  How you are feeling.  If you are feeling the baby move.  If you have had any abnormal symptoms, such as leaking fluid, bleeding, severe headaches, or abdominal cramping.  If you have any questions. Other tests that may be performed during your second trimester include:  Blood tests that check for:  Low iron levels (anemia).  Gestational diabetes (between 24 and 28 weeks).  Rh antibodies.  Urine tests to check for infections, diabetes, or protein in the urine.  An ultrasound to confirm the proper growth and development of the baby.  An amniocentesis to check for possible genetic problems.  Fetal screens for spina bifida and Down syndrome. HOME CARE INSTRUCTIONS   Avoid all smoking, herbs, alcohol, and unprescribed drugs. These chemicals affect the formation and growth of the baby.  Follow your health care provider's instructions regarding  medicine use. There are medicines that are either safe or unsafe to take during pregnancy.  Exercise only as directed by your health care provider. Experiencing uterine cramps is a good sign to stop exercising.  Continue to eat regular, healthy meals.  Wear a good support bra for breast tenderness.  Do not use hot tubs, steam rooms, or saunas.  Wear your seat belt at all times when driving.  Avoid raw meat, uncooked cheese, cat litter boxes, and soil used by cats. These carry germs that can cause birth defects in the baby.  Take your prenatal vitamins.  Try taking a stool softener (if your health care provider approves) if you develop constipation. Eat more high-fiber foods, such as fresh vegetables or fruit and whole grains. Drink plenty of fluids to keep your urine clear or pale yellow.  Take warm sitz baths to soothe any pain or discomfort caused by hemorrhoids. Use hemorrhoid cream if your health care provider approves.  If you develop varicose veins, wear support hose. Elevate your feet for 15 minutes, 3-4 times a day. Limit salt in your diet.  Avoid heavy lifting, wear low heel shoes, and practice good posture.  Rest with your legs elevated if you have leg cramps or low back pain.  Visit your dentist if you have not gone yet during your pregnancy. Use a soft toothbrush to brush your teeth and be gentle when you floss.  A sexual relationship may be continued unless your health care provider directs you otherwise.  Continue to go to all your prenatal visits as directed by your health care provider. SEEK MEDICAL CARE IF:   You have dizziness.  You have mild pelvic cramps, pelvic pressure, or nagging pain in the abdominal area.  You have persistent nausea, vomiting, or diarrhea.  You have a bad smelling vaginal discharge.  You have pain with urination. SEEK IMMEDIATE MEDICAL CARE IF:   You have a fever.  You are leaking fluid from your vagina.  You have spotting or  bleeding from your vagina.  You have severe abdominal cramping or pain.  You have rapid weight gain or loss.  You have shortness of breath with chest pain.  You notice sudden or extreme swelling of your face, hands, ankles, feet, or legs.  You  have not felt your baby move in over an hour.  You have severe headaches that do not go away with medicine.  You have vision changes. Document Released: 11/27/2001 Document Revised: 12/08/2013 Document Reviewed: 02/03/2013 [facility] Patient Information 2015 [place], [place]. This information is not intended to replace advice given to you by your health care provider. Make sure you discuss any questions you have with your health care provider.

## 2020-07-12 LAB — PMP SCREEN PROFILE (10S), URINE
Amphetamine Scrn, Ur: NEGATIVE ng/mL
BARBITURATE SCREEN URINE: NEGATIVE ng/mL
BENZODIAZEPINE SCREEN, URINE: NEGATIVE ng/mL
CANNABINOIDS UR QL SCN: NEGATIVE ng/mL
Cocaine (Metab) Scrn, Ur: NEGATIVE ng/mL
Creatinine(Crt), U: 101.8 mg/dL (ref 20.0–300.0)
Methadone Screen, Urine: NEGATIVE ng/mL
OXYCODONE+OXYMORPHONE UR QL SCN: NEGATIVE ng/mL
Opiate Scrn, Ur: NEGATIVE ng/mL
Ph of Urine: 6.1 (ref 4.5–8.9)
Phencyclidine Qn, Ur: NEGATIVE ng/mL
Propoxyphene Scrn, Ur: NEGATIVE ng/mL

## 2020-07-12 LAB — MED LIST OPTION NOT SELECTED

## 2020-08-08 ENCOUNTER — Other Ambulatory Visit: Payer: Medicaid Other

## 2020-08-08 ENCOUNTER — Other Ambulatory Visit (HOSPITAL_COMMUNITY)
Admission: RE | Admit: 2020-08-08 | Discharge: 2020-08-08 | Disposition: A | Discharge status: Home / Self Care | Payer: Medicaid Other | Source: Ambulatory Visit | Attending: Obstetrics & Gynecology | Admitting: Obstetrics & Gynecology

## 2020-08-08 ENCOUNTER — Ambulatory Visit (INDEPENDENT_AMBULATORY_CARE_PROVIDER_SITE_OTHER): Payer: Medicaid Other | Admitting: Women's Health

## 2020-08-08 ENCOUNTER — Encounter: Payer: Self-pay | Admitting: Women's Health

## 2020-08-08 VITALS — BP 107/71 | HR 78 | Wt 161.0 lb

## 2020-08-08 DIAGNOSIS — Z3A27 27 weeks gestation of pregnancy: Secondary | ICD-10-CM

## 2020-08-08 DIAGNOSIS — R7989 Other specified abnormal findings of blood chemistry: Secondary | ICD-10-CM

## 2020-08-08 DIAGNOSIS — L292 Pruritus vulvae: Secondary | ICD-10-CM | POA: Diagnosis present

## 2020-08-08 DIAGNOSIS — Z23 Encounter for immunization: Secondary | ICD-10-CM | POA: Diagnosis not present

## 2020-08-08 DIAGNOSIS — Z331 Pregnant state, incidental: Secondary | ICD-10-CM

## 2020-08-08 DIAGNOSIS — Z3402 Encounter for supervision of normal first pregnancy, second trimester: Secondary | ICD-10-CM

## 2020-08-08 DIAGNOSIS — Z1389 Encounter for screening for other disorder: Secondary | ICD-10-CM | POA: Diagnosis not present

## 2020-08-08 LAB — POCT URINALYSIS DIPSTICK OB
Blood, UA: NEGATIVE
Glucose, UA: NEGATIVE
Ketones, UA: NEGATIVE
Nitrite, UA: NEGATIVE
POC,PROTEIN,UA: NEGATIVE

## 2020-08-08 NOTE — Progress Notes (Signed)
.     LOW-RISK PREGNANCY VISIT Patient name: Kathleen Grimes MRN [id number]  Date of birth: [birth year] Chief Complaint:   Routine Prenatal Visit (PN2 today; vaginal itching )  History of Present Illness:   Kathleen Grimes is a 19 y.o. G68P0000 female at [redacted]w[redacted]d with an Estimated Date of Delivery: 11/07/20 being seen today for ongoing management of a low-risk pregnancy.  Depression screen [facility] 2/9 04/25/2020  Decreased Interest 3  Down, Depressed, Hopeless 0  PHQ - 2 Score 3  Altered sleeping 0  Tired, decreased energy 1  Change in appetite 2  Feeling bad or failure about yourself  0  Trouble concentrating 0  Moving slowly or fidgety/restless 1  Suicidal thoughts 0  PHQ-9 Score 7    Today she reports vulovaginal itching x 3wks, no odor or abnormal d/c. Contractions: Not present. Vag. Bleeding: None.  Movement: Present. denies leaking of fluid. Review of Systems:   Pertinent items are noted in HPI Denies abnormal vaginal discharge w/ itching/odor/irritation, headaches, visual changes, shortness of breath, chest pain, abdominal pain, severe nausea/vomiting, or problems with urination or bowel movements unless otherwise stated above. Pertinent History Reviewed:  Reviewed past medical,surgical, social, obstetrical and family history.  Reviewed problem list, medications and allergies. Physical Assessment:   Vitals:   08/08/20 0921  BP: 107/71  Pulse: 78  Weight: 161 lb (73 kg)  Body mass index is 25.22 kg/m.        Physical Examination:   General appearance: Well appearing, and in no distress  Mental status: Alert, oriented to person, place, and time  Skin: Warm & dry  Cardiovascular: Normal heart rate noted  Respiratory: Normal respiratory effort, no distress  Abdomen: Soft, gravid, nontender  Pelvic: spec exam: cx visually closed, small amt white d/c         Extremities: Edema: Trace  Fetal Status: Fetal Heart Rate (bpm): 141 Fundal Height: 26 cm Movement: Present    Chaperone:  Malachy Mood    No results found for this or any previous visit (from the past 24 hour(s)).  Assessment & Plan:  1) Low-risk pregnancy G1P0000 at [redacted]w[redacted]d with an Estimated Date of Delivery: 11/07/20   2) Vulvovaginal itching, CV swab sent  3) Elevated LFTs> cholecystectomy earlier in pregnancy, repeat CMP today   Meds: No orders of the defined types were placed in this encounter.  Labs/procedures today: pn2, tdap, CV swab  Plan:  Continue routine obstetrical care  Next visit: prefers in person    Reviewed: Preterm labor symptoms and general obstetric precautions including but not limited to vaginal bleeding, contractions, leaking of fluid and fetal movement were reviewed in detail with the patient.  All questions were answered. Has home bp cuff. Check bp weekly, let us know if >140/90.   Follow-up: Return in about 3 weeks (around 08/29/2020) for LROB, CNM.  Orders Placed This Encounter  Procedures  . Tdap vaccine greater than or equal to 7yo IM  . Comprehensive metabolic panel  . POC Urinalysis Dipstick OB   Cheral Marker CNM, [facility] 08/08/2020 10:11 AM

## 2020-08-08 NOTE — Patient Instructions (Signed)
Kathleen Grimes, I greatly value your feedback.  If you receive a survey following your visit with [name] today, we appreciate you taking the time to fill it out.  Thanks, Kathleen Grimes, CNM, WHNP-BC   [facility] ([address] [place], [place] [address]) Entrance C, located off of [address] valet parking  Go to [email or web address] to register for FREE online childbirth classes   Call the office [phone]) or go to [facility] if:  You begin to have strong, frequent contractions  Your water breaks.  Sometimes it is a big gush of fluid, sometimes it is just a trickle that keeps getting your panties wet or running down your legs  You have vaginal bleeding.  It is normal to have a small amount of spotting if your cervix was checked.   You don't feel your baby moving like normal.  If you don't, get you something to eat and drink and lay down and focus on feeling your baby move.  You should feel at least 10 movements in 2 hours.  If you don't, you should call the office or go to [facility].    Tdap Vaccine  It is recommended that you get the Tdap vaccine during the third trimester of EACH pregnancy to help protect your baby from getting pertussis (whooping cough)  27-36 weeks is the BEST time to do this so that you can pass the protection on to your baby. During pregnancy is better than after pregnancy, but if you are unable to get it during pregnancy it will be offered at the hospital.   You can get this vaccine with [name], at the health department, your family doctor, or some local pharmacies  Everyone who will be around your baby should also be up-to-date on their vaccines before the baby comes. Adults (who are not pregnant) only need 1 dose of Tdap during adulthood.   [place] Pediatricians/Family Doctors:  [facility] [phone]            [facility] [phone]                 [facility]  [phone] (usually not accepting new patients unless you have family there already, you are always welcome to call and ask)       [facility] [phone]       [facility] Pediatricians/Family Doctors:   [facility]: [phone]  [facility]: [phone]  [facility]: [phone]  [facility] Doctors:   [facility]: [phone]   [facility]: [phone]  [facility] Doctors:  [facility]: [phone]   Home Blood Pressure Monitoring for Patients   Your provider has recommended that you check your blood pressure (BP) at least once a week at home. If you do not have a blood pressure cuff at home, one will be provided for you. Contact your provider if you have not received your monitor within 1 week.   Helpful Tips for Accurate Home Blood Pressure Checks  . Don't smoke, exercise, or drink caffeine 30 minutes before checking your BP . Use the restroom before checking your BP (a full bladder can raise your pressure) . Relax in a comfortable upright chair . Feet on the ground . Left arm resting comfortably on a flat surface at the level of your heart . Legs uncrossed . Back supported . Sit quietly and don't talk . Place the cuff on your  bare arm . Adjust snuggly, so that only two fingertips can fit between your skin and the top of the cuff . Check 2 readings separated by at least one minute . Keep a log of your BP readings . For a visual, please reference this diagram: http://ccnc.care/bpdiagram  Provider Name: [facility]     Phone: [phone]  Zone 1: ALL CLEAR  Continue to monitor your symptoms:  . BP reading is less than 140 (top number) or less than 90 (bottom number)  . No right upper stomach pain . No headaches or seeing spots . No feeling nauseated or throwing up . No swelling in face and hands  Zone 2: CAUTION Call your  doctor's office for any of the following:  . BP reading is greater than 140 (top number) or greater than 90 (bottom number)  . Stomach pain under your ribs in the middle or right side . Headaches or seeing spots . Feeling nauseated or throwing up . Swelling in face and hands  Zone 3: EMERGENCY  Seek immediate medical care if you have any of the following:  . BP reading is greater than160 (top number) or greater than 110 (bottom number) . Severe headaches not improving with Tylenol . Serious difficulty catching your breath . Any worsening symptoms from Zone 2   Third Trimester of Pregnancy The third trimester is from week 29 through week 42, months 7 through 9. The third trimester is a time when the fetus is growing rapidly. At the end of the ninth month, the fetus is about 20 inches in length and weighs 6-10 pounds.  BODY CHANGES Your body goes through many changes during pregnancy. The changes vary from woman to woman.   Your weight will continue to increase. You can expect to gain 25-35 pounds (11-16 kg) by the end of the pregnancy.  You may begin to get stretch marks on your hips, abdomen, and breasts.  You may urinate more often because the fetus is moving lower into your pelvis and pressing on your bladder.  You may develop or continue to have heartburn as a result of your pregnancy.  You may develop constipation because certain hormones are causing the muscles that push waste through your intestines to slow down.  You may develop hemorrhoids or swollen, bulging veins (varicose veins).  You may have pelvic pain because of the weight gain and pregnancy hormones relaxing your joints between the bones in your pelvis. Backaches may result from overexertion of the muscles supporting your posture.  You may have changes in your hair. These can include thickening of your hair, rapid growth, and changes in texture. Some women also have hair loss during or after pregnancy, or hair that  feels dry or thin. Your hair will most likely return to normal after your baby is born.  Your breasts will continue to grow and be tender. A yellow discharge may leak from your breasts called colostrum.  Your belly button may stick out.  You may feel short of breath because of your expanding uterus.  You may notice the fetus "dropping," or moving lower in your abdomen.  You may have a bloody mucus discharge. This usually occurs a few days to a week before labor begins.  Your cervix becomes thin and soft (effaced) near your due date. WHAT TO EXPECT AT YOUR PRENATAL EXAMS  You will have prenatal exams every 2 weeks until week 36. Then, you will have weekly prenatal exams. During a routine prenatal visit:  You will be weighed to make sure you and the fetus are growing normally.  Your blood pressure is taken.  Your abdomen will be measured to track your baby's growth.  The fetal heartbeat will be listened to.  Any test results from the previous visit will be discussed.  You may have a cervical check near your due date to see if you have effaced. At around 36 weeks, your caregiver will check your cervix. At the same time, your caregiver will also perform a test on the secretions of the vaginal tissue. This test is to determine if a type of bacteria, Group B streptococcus, is present. Your caregiver will explain this further. Your caregiver may ask you:  What your birth plan is.  How you are feeling.  If you are feeling the baby move.  If you have had any abnormal symptoms, such as leaking fluid, bleeding, severe headaches, or abdominal cramping.  If you have any questions. Other tests or screenings that may be performed during your third trimester include:  Blood tests that check for low iron levels (anemia).  Fetal testing to check the health, activity level, and growth of the fetus. Testing is done if you have certain medical conditions or if there are problems during the  pregnancy. FALSE LABOR You may feel small, irregular contractions that eventually go away. These are called Braxton Hicks contractions, or false labor. Contractions may last for hours, days, or even weeks before true labor sets in. If contractions come at regular intervals, intensify, or become painful, it is best to be seen by your caregiver.  SIGNS OF LABOR   Menstrual-like cramps.  Contractions that are 5 minutes apart or less.  Contractions that start on the top of the uterus and spread down to the lower abdomen and back.  A sense of increased pelvic pressure or back pain.  A watery or bloody mucus discharge that comes from the vagina. If you have any of these signs before the 37th week of pregnancy, call your caregiver right away. You need to go to the hospital to get checked immediately. HOME CARE INSTRUCTIONS   Avoid all smoking, herbs, alcohol, and unprescribed drugs. These chemicals affect the formation and growth of the baby.  Follow your caregiver's instructions regarding medicine use. There are medicines that are either safe or unsafe to take during pregnancy.  Exercise only as directed by your caregiver. Experiencing uterine cramps is a good sign to stop exercising.  Continue to eat regular, healthy meals.  Wear a good support bra for breast tenderness.  Do not use hot tubs, steam rooms, or saunas.  Wear your seat belt at all times when driving.  Avoid raw meat, uncooked cheese, cat litter boxes, and soil used by cats. These carry germs that can cause birth defects in the baby.  Take your prenatal vitamins.  Try taking a stool softener (if your caregiver approves) if you develop constipation. Eat more high-fiber foods, such as fresh vegetables or fruit and whole grains. Drink plenty of fluids to keep your urine clear or pale yellow.  Take warm sitz baths to soothe any pain or discomfort caused by hemorrhoids. Use hemorrhoid cream if your caregiver approves.  If you  develop varicose veins, wear support hose. Elevate your feet for 15 minutes, 3-4 times a day. Limit salt in your diet.  Avoid heavy lifting, wear low heal shoes, and practice good posture.  Rest a lot with your legs elevated if you have leg cramps or low  back pain.  Visit your dentist if you have not gone during your pregnancy. Use a soft toothbrush to brush your teeth and be gentle when you floss.  A sexual relationship may be continued unless your caregiver directs you otherwise.  Do not travel far distances unless it is absolutely necessary and only with the approval of your caregiver.  Take prenatal classes to understand, practice, and ask questions about the labor and delivery.  Make a trial run to the hospital.  Pack your hospital bag.  Prepare the baby's nursery.  Continue to go to all your prenatal visits as directed by your caregiver. SEEK MEDICAL CARE IF:  You are unsure if you are in labor or if your water has broken.  You have dizziness.  You have mild pelvic cramps, pelvic pressure, or nagging pain in your abdominal area.  You have persistent nausea, vomiting, or diarrhea.  You have a bad smelling vaginal discharge.  You have pain with urination. SEEK IMMEDIATE MEDICAL CARE IF:   You have a fever.  You are leaking fluid from your vagina.  You have spotting or bleeding from your vagina.  You have severe abdominal cramping or pain.  You have rapid weight loss or gain.  You have shortness of breath with chest pain.  You notice sudden or extreme swelling of your face, hands, ankles, feet, or legs.  You have not felt your baby move in over an hour.  You have severe headaches that do not go away with medicine.  You have vision changes. Document Released: 11/27/2001 Document Revised: 12/08/2013 Document Reviewed: 02/03/2013 [facility] Patient Information 2015 [place], [place]. This information is not intended to replace advice given to you by your health  care provider. Make sure you discuss any questions you have with your health care provider.

## 2020-08-09 ENCOUNTER — Other Ambulatory Visit: Payer: Self-pay | Admitting: Women's Health

## 2020-08-09 LAB — COMPREHENSIVE METABOLIC PANEL
ALT: 42 IU/L — ABNORMAL HIGH (ref 0–32)
AST: 81 IU/L — ABNORMAL HIGH (ref 0–40)
Albumin/Globulin Ratio: 1.7 (ref 1.2–2.2)
Albumin: 3.8 g/dL — ABNORMAL LOW (ref 3.9–5.0)
Alkaline Phosphatase: 112 IU/L — ABNORMAL HIGH (ref 45–106)
BUN/Creatinine Ratio: 10 (ref 9–23)
BUN: 5 mg/dL — ABNORMAL LOW (ref 6–20)
Bilirubin Total: 0.4 mg/dL (ref 0.0–1.2)
CO2: 23 mmol/L (ref 20–29)
Calcium: 8.6 mg/dL — ABNORMAL LOW (ref 8.7–10.2)
Chloride: 102 mmol/L (ref 96–106)
Creatinine, Ser: 0.48 mg/dL — ABNORMAL LOW (ref 0.57–1.00)
GFR calc Af Amer: 163 mL/min/{1.73_m2} (ref >59)
GFR calc non Af Amer: 142 mL/min/{1.73_m2} (ref >59)
Globulin, Total: 2.2 g/dL (ref 1.5–4.5)
Glucose: 86 mg/dL (ref 65–99)
Potassium: 3.9 mmol/L (ref 3.5–5.2)
Sodium: 137 mmol/L (ref 134–144)
Total Protein: 6 g/dL (ref 6.0–8.5)

## 2020-08-09 LAB — CERVICOVAGINAL ANCILLARY ONLY
Bacterial Vaginitis (gardnerella): NEGATIVE
Candida Glabrata: NEGATIVE
Candida Vaginitis: POSITIVE — AB
Chlamydia: NEGATIVE
Comment: NEGATIVE
Comment: NEGATIVE
Comment: NEGATIVE
Comment: NEGATIVE
Comment: NEGATIVE
Comment: NORMAL
Neisseria Gonorrhea: NEGATIVE
Trichomonas: NEGATIVE

## 2020-08-09 LAB — CBC
Hematocrit: 37.2 % (ref 34.0–46.6)
Hemoglobin: 12.7 g/dL (ref 11.1–15.9)
MCH: 33.2 pg — ABNORMAL HIGH (ref 26.6–33.0)
MCHC: 34.1 g/dL (ref 31.5–35.7)
MCV: 97 fL (ref 79–97)
Platelets: 273 10*3/uL (ref 150–450)
RBC: 3.83 x10E6/uL (ref 3.77–5.28)
RDW: 11.8 % (ref 11.7–15.4)
WBC: 7.5 10*3/uL (ref 3.4–10.8)

## 2020-08-09 LAB — RPR: RPR Ser Ql: NONREACTIVE

## 2020-08-09 LAB — GLUCOSE TOLERANCE, 2 HOURS W/ 1HR
Glucose, 1 hour: 82 mg/dL (ref 65–179)
Glucose, 2 hour: 123 mg/dL (ref 65–152)
Glucose, Fasting: 76 mg/dL (ref 65–91)

## 2020-08-09 LAB — HIV ANTIBODY (ROUTINE TESTING W REFLEX): HIV Screen 4th Generation wRfx: NONREACTIVE

## 2020-08-09 LAB — ANTIBODY SCREEN: Antibody Screen: NEGATIVE

## 2020-08-09 MED ORDER — TERCONAZOLE 0.4 % VA CREA: 1.0000 | TOPICAL_CREAM | Freq: Every day | VAGINAL | 0 refills | Status: DC

## 2020-08-12 ENCOUNTER — Telehealth: Payer: Self-pay | Admitting: *Deleted

## 2020-08-12 NOTE — Telephone Encounter (Signed)
Patient states she has been having lower abdominal cramping along with what feels like rectal pressure for about 3 hours .  She is at work and does some lifting but has not been lifting anything out of the ordinary.  She is drinking about 2 cups of waters daily.  Encouraged patient to push more fluids today and try to avoid lifting if possible. Advised if cramping or pressure became more intense and consistent, to be checked out at the hospital. Pt verbalized understanding.

## 2020-08-29 ENCOUNTER — Ambulatory Visit (INDEPENDENT_AMBULATORY_CARE_PROVIDER_SITE_OTHER): Payer: Medicaid Other | Admitting: Women's Health

## 2020-08-29 ENCOUNTER — Other Ambulatory Visit: Payer: Self-pay

## 2020-08-29 ENCOUNTER — Encounter: Payer: Self-pay | Admitting: Women's Health

## 2020-08-29 VITALS — BP 114/72 | HR 88 | Wt 162.0 lb

## 2020-08-29 DIAGNOSIS — Z331 Pregnant state, incidental: Secondary | ICD-10-CM | POA: Diagnosis not present

## 2020-08-29 DIAGNOSIS — Z3403 Encounter for supervision of normal first pregnancy, third trimester: Secondary | ICD-10-CM

## 2020-08-29 DIAGNOSIS — Z3A30 30 weeks gestation of pregnancy: Secondary | ICD-10-CM

## 2020-08-29 DIAGNOSIS — R7989 Other specified abnormal findings of blood chemistry: Secondary | ICD-10-CM

## 2020-08-29 DIAGNOSIS — Z1389 Encounter for screening for other disorder: Secondary | ICD-10-CM

## 2020-08-29 LAB — POCT URINALYSIS DIPSTICK OB
Blood, UA: NEGATIVE
Glucose, UA: NEGATIVE
Ketones, UA: NEGATIVE
Leukocytes, UA: NEGATIVE
Nitrite, UA: NEGATIVE
POC,PROTEIN,UA: NEGATIVE

## 2020-08-29 NOTE — Progress Notes (Signed)
LOW-RISK PREGNANCY VISIT Patient name: Kathleen Grimes MRN [id number]  Date of birth: [birth year] Chief Complaint:   Routine Prenatal Visit  History of Present Illness:   Kathleen Grimes is a 19 y.o. G53P0000 female at [redacted]w[redacted]d with an Estimated Date of Delivery: 11/07/20 being seen today for ongoing management of a low-risk pregnancy.  Depression screen [facility] 2/9 04/25/2020  Decreased Interest 3  Down, Depressed, Hopeless 0  PHQ - 2 Score 3  Altered sleeping 0  Tired, decreased energy 1  Change in appetite 2  Feeling bad or failure about yourself  0  Trouble concentrating 0  Moving slowly or fidgety/restless 1  Suicidal thoughts 0  PHQ-9 Score 7    Today she reports no complaints. Cholecystectomy early pregnancy, LFTs had been trending down, on last check 3 wks ago AST increasing. Denies ruq pain, n/v, etc.  Contractions: Not present. Vag. Bleeding: None.  Movement: Present. denies leaking of fluid. Review of Systems:   Pertinent items are noted in HPI Denies abnormal vaginal discharge w/ itching/odor/irritation, headaches, visual changes, shortness of breath, chest pain, abdominal pain, severe nausea/vomiting, or problems with urination or bowel movements unless otherwise stated above. Pertinent History Reviewed:  Reviewed past medical,surgical, social, obstetrical and family history.  Reviewed problem list, medications and allergies. Physical Assessment:   Vitals:   08/29/20 1016  BP: 114/72  Pulse: 88  Weight: 162 lb (73.5 kg)  Body mass index is 25.37 kg/m.        Physical Examination:   General appearance: Well appearing, and in no distress  Mental status: Alert, oriented to person, place, and time  Skin: Warm & dry  Cardiovascular: Normal heart rate noted  Respiratory: Normal respiratory effort, no distress  Abdomen: Soft, gravid, nontender, no RUQ/epigastric tenderness  Pelvic: Cervical exam deferred         Extremities: Edema: None  Fetal Status: Fetal Heart Rate  (bpm): 160 Fundal Height: 29 cm Movement: Present    Chaperone: n/a    Results for orders placed or performed in visit on 08/29/20 (from the past 24 hour(s))  POC Urinalysis Dipstick OB   Collection Time: 08/29/20 10:17 AM  Result Value Ref Range   Color, UA     Clarity, UA     Glucose, UA Negative Negative   Bilirubin, UA     Ketones, UA neg    Spec Grav, UA     Blood, UA neg    pH, UA     POC,PROTEIN,UA Negative Negative, Trace, Small (1+), Moderate (2+), Large (3+), 4+   Urobilinogen, UA     Nitrite, UA neg    Leukocytes, UA Negative Negative   Appearance     Odor      Assessment & Plan:  1) Low-risk pregnancy G1P0000 at [redacted]w[redacted]d with an Estimated Date of Delivery: 11/07/20   2) Elevated LFTs s/p cholecystectomy, will repeat today, no sx   Meds: No orders of the defined types were placed in this encounter.  Labs/procedures today: cmp  Plan:  Continue routine obstetrical care  Next visit: prefers in person    Reviewed: Preterm labor symptoms and general obstetric precautions including but not limited to vaginal bleeding, contractions, leaking of fluid and fetal movement were reviewed in detail with the patient.  All questions were answered. Has home bp cuff. Check bp weekly, let us know if >140/90.   Follow-up: Return in about 2 weeks (around 09/12/2020) for LROB, CNM, in person.  Orders Placed This Encounter  Procedures  .  Comprehensive metabolic panel  . POC Urinalysis Dipstick OB   Cheral Marker CNM, [facility] 08/29/2020 11:09 AM

## 2020-08-29 NOTE — Patient Instructions (Signed)
Kathleen Grimes, I greatly value your feedback.  If you receive a survey following your visit with [name] today, we appreciate you taking the time to fill it out.  Thanks, [name], CNM, WHNP-BC  [facility] ([address] [place], [place] [address]) Entrance C, located off of [address] valet parking   Go to [email or web address] to register for FREE online childbirth classes    Call the office [phone]) or go to [facility] if:  You begin to have strong, frequent contractions  Your water breaks.  Sometimes it is a big gush of fluid, sometimes it is just a trickle that keeps getting your panties wet or running down your legs  You have vaginal bleeding.  It is normal to have a small amount of spotting if your cervix was checked.   You don't feel your baby moving like normal.  If you don't, get you something to eat and drink and lay down and focus on feeling your baby move.  You should feel at least 10 movements in 2 hours.  If you don't, you should call the office or go to [facility].   Call the office [phone]) or go to [facility] hospital for these signs of pre-eclampsia:  Severe headache that does not go away with Tylenol  Visual changes- seeing spots, double, blurred vision  Pain under your right breast or upper abdomen that does not go away with Tums or heartburn medicine  Nausea and/or vomiting  Severe swelling in your hands, feet, and face    Home Blood Pressure Monitoring for Patients   Your provider has recommended that you check your blood pressure (BP) at least once a week at home. If you do not have a blood pressure cuff at home, one will be provided for you. Contact your provider if you have not received your monitor within 1 week.   Helpful Tips for Accurate Home Blood Pressure Checks   Don't smoke, exercise, or drink caffeine 30 minutes before checking your BP  Use the restroom before checking your BP (a full bladder  can raise your pressure)  Relax in a comfortable upright chair  Feet on the ground  Left arm resting comfortably on a flat surface at the level of your heart  Legs uncrossed  Back supported  Sit quietly and don't talk  Place the cuff on your bare arm  Adjust snuggly, so that only two fingertips can fit between your skin and the top of the cuff  Check 2 readings separated by at least one minute  Keep a log of your BP readings  For a visual, please reference this diagram: http://ccnc.care/bpdiagram  Provider Name: [facility]     Phone: [phone]  Zone 1: ALL CLEAR  Continue to monitor your symptoms:   BP reading is less than 140 (top number) or less than 90 (bottom number)   No right upper stomach pain  No headaches or seeing spots  No feeling nauseated or throwing up  No swelling in face and hands  Zone 2: CAUTION Call your doctor's office for any of the following:   BP reading is greater than 140 (top number) or greater than 90 (bottom number)   Stomach pain under your ribs in the middle or right side  Headaches or seeing spots  Feeling nauseated or throwing up  Swelling in face and hands  Zone 3: EMERGENCY  Seek immediate medical care if you have any of  the following:   BP reading is greater than160 (top number) or greater than 110 (bottom number)  Severe headaches not improving with Tylenol  Serious difficulty catching your breath  Any worsening symptoms from Zone 2  Preterm Labor and Birth Information  The normal length of a pregnancy is 39-41 weeks. Preterm labor is when labor starts before 37 completed weeks of pregnancy. What are the risk factors for preterm labor? Preterm labor is more likely to occur in women who:  Have certain infections during pregnancy such as a bladder infection, sexually transmitted infection, or infection inside the uterus (chorioamnionitis).  Have a shorter-than-normal cervix.  Have gone into preterm  labor before.  Have had surgery on their cervix.  Are younger than age 23 or older than age 45.  Are African American.  Are pregnant with twins or multiple babies (multiple gestation).  Take street drugs or smoke while pregnant.  Do not gain enough weight while pregnant.  Became pregnant shortly after having been pregnant. What are the symptoms of preterm labor? Symptoms of preterm labor include:  Cramps similar to those that can happen during a menstrual period. The cramps may happen with diarrhea.  Pain in the abdomen or lower back.  Regular uterine contractions that may feel like tightening of the abdomen.  A feeling of increased pressure in the pelvis.  Increased watery or bloody mucus discharge from the vagina.  Water breaking (ruptured amniotic sac). Why is it important to recognize signs of preterm labor? It is important to recognize signs of preterm labor because babies who are born prematurely may not be fully developed. This can put them at an increased risk for:  Long-term (chronic) heart and lung problems.  Difficulty immediately after birth with regulating body systems, including blood sugar, body temperature, heart rate, and breathing rate.  Bleeding in the brain.  Cerebral palsy.  Learning difficulties.  Death. These risks are highest for babies who are born before 34 weeks of pregnancy. How is preterm labor treated? Treatment depends on the length of your pregnancy, your condition, and the health of your baby. It may involve: 1. Having a stitch (suture) placed in your cervix to prevent your cervix from opening too early (cerclage). 2. Taking or being given medicines, such as: ? Hormone medicines. These may be given early in pregnancy to help support the pregnancy. ? Medicine to stop contractions. ? Medicines to help mature the babys lungs. These may be prescribed if the risk of delivery is high. ? Medicines to prevent your baby from developing  cerebral palsy. If the labor happens before 34 weeks of pregnancy, you may need to stay in the hospital. What should I do if I think I am in preterm labor? If you think that you are going into preterm labor, call your health care provider right away. How can I prevent preterm labor in future pregnancies? To increase your chance of having a full-term pregnancy:  Do not use any tobacco products, such as cigarettes, chewing tobacco, and e-cigarettes. If you need help quitting, ask your health care provider.  Do not use street drugs or medicines that have not been prescribed to you during your pregnancy.  Talk with your health care provider before taking any herbal supplements, even if you have been taking them regularly.  Make sure you gain a healthy amount of weight during your pregnancy.  Watch for infection. If you think that you might have an infection, get it checked right away.  Make sure to  tell your health care provider if you have gone into preterm labor before. This information is not intended to replace advice given to you by your health care provider. Make sure you discuss any questions you have with your health care provider. Document Revised: 03/27/2019 Document Reviewed: 04/25/2016 Elsevier Patient Education  [place].

## 2020-08-30 LAB — COMPREHENSIVE METABOLIC PANEL
ALT: 20 IU/L (ref 0–32)
AST: 12 IU/L (ref 0–40)
Albumin/Globulin Ratio: 1.5 (ref 1.2–2.2)
Albumin: 3.8 g/dL — ABNORMAL LOW (ref 3.9–5.0)
Alkaline Phosphatase: 109 IU/L — ABNORMAL HIGH (ref 42–106)
BUN/Creatinine Ratio: 9 (ref 9–23)
BUN: 5 mg/dL — ABNORMAL LOW (ref 6–20)
Bilirubin Total: 0.3 mg/dL (ref 0.0–1.2)
CO2: 22 mmol/L (ref 20–29)
Calcium: 9.1 mg/dL (ref 8.7–10.2)
Chloride: 103 mmol/L (ref 96–106)
Creatinine, Ser: 0.56 mg/dL — ABNORMAL LOW (ref 0.57–1.00)
GFR calc Af Amer: 155 mL/min/{1.73_m2} (ref >59)
GFR calc non Af Amer: 135 mL/min/{1.73_m2} (ref >59)
Globulin, Total: 2.6 g/dL (ref 1.5–4.5)
Glucose: 73 mg/dL (ref 65–99)
Potassium: 4.5 mmol/L (ref 3.5–5.2)
Sodium: 139 mmol/L (ref 134–144)
Total Protein: 6.4 g/dL (ref 6.0–8.5)

## 2020-09-12 ENCOUNTER — Ambulatory Visit (INDEPENDENT_AMBULATORY_CARE_PROVIDER_SITE_OTHER): Payer: Medicaid Other | Admitting: Women's Health

## 2020-09-12 ENCOUNTER — Encounter: Payer: Self-pay | Admitting: Women's Health

## 2020-09-12 VITALS — BP 105/57 | HR 74 | Wt 168.4 lb

## 2020-09-12 DIAGNOSIS — Z3403 Encounter for supervision of normal first pregnancy, third trimester: Secondary | ICD-10-CM

## 2020-09-12 DIAGNOSIS — Z331 Pregnant state, incidental: Secondary | ICD-10-CM

## 2020-09-12 DIAGNOSIS — Z1389 Encounter for screening for other disorder: Secondary | ICD-10-CM

## 2020-09-12 LAB — POCT URINALYSIS DIPSTICK OB
Blood, UA: NEGATIVE
Glucose, UA: NEGATIVE
Ketones, UA: NEGATIVE
Leukocytes, UA: NEGATIVE
Nitrite, UA: NEGATIVE
POC,PROTEIN,UA: NEGATIVE

## 2020-09-12 NOTE — Patient Instructions (Addendum)
Kathleen Grimes, I greatly value your feedback.  If you receive a survey following your visit with [name] today, we appreciate you taking the time to fill it out.  Thanks, [name], CNM, WHNP-BC  [facility] ([address] [place], [place] [address]) Entrance C, located off of [address] valet parking   Go to [email or web address] to register for FREE online childbirth classes   For your lower back pain you may:  Purchase a pregnancy/maternity support belt from [name], [employer], [name], [employer], etc and wear it while you are up and about  Take warm baths  Use a heating pad to your lower back for no longer than 20 minutes at a time, and do not place near abdomen  Take tylenol as needed. Please follow directions on the bottle  Kinesiology tape (can get from sporting goods store), google how to tape belly for pregnancy     Call the office [phone]) or go to [facility] if:  You begin to have strong, frequent contractions  Your water breaks.  Sometimes it is a big gush of fluid, sometimes it is just a trickle that keeps getting your panties wet or running down your legs  You have vaginal bleeding.  It is normal to have a small amount of spotting if your cervix was checked.   You don't feel your baby moving like normal.  If you don't, get you something to eat and drink and lay down and focus on feeling your baby move.  You should feel at least 10 movements in 2 hours.  If you don't, you should call the office or go to [facility].   Call the office [phone]) or go to [facility] hospital for these signs of pre-eclampsia:  Severe headache that does not go away with Tylenol  Visual changes- seeing spots, double, blurred vision  Pain under your right breast or upper abdomen that does not go away with Tums or heartburn medicine  Nausea and/or vomiting  Severe swelling in your hands, feet, and face    Home Blood Pressure  Monitoring for Patients   Your provider has recommended that you check your blood pressure (BP) at least once a week at home. If you do not have a blood pressure cuff at home, one will be provided for you. Contact your provider if you have not received your monitor within 1 week.   Helpful Tips for Accurate Home Blood Pressure Checks  . Don't smoke, exercise, or drink caffeine 30 minutes before checking your BP . Use the restroom before checking your BP (a full bladder can raise your pressure) . Relax in a comfortable upright chair . Feet on the ground . Left arm resting comfortably on a flat surface at the level of your heart . Legs uncrossed . Back supported . Sit quietly and don't talk . Place the cuff on your bare arm . Adjust snuggly, so that only two fingertips can fit between your skin and the top of the cuff . Check 2 readings separated by at least one minute . Keep a log of your BP readings . For a visual, please reference this diagram: http://ccnc.care/bpdiagram  Provider Name: [facility]     Phone: [phone]  Zone 1: ALL CLEAR  Continue to monitor your symptoms:  . BP reading is less than 140 (top number) or less than 90 (bottom number)  . No right upper stomach pain . No headaches or seeing spots .  No feeling nauseated or throwing up . No swelling in face and hands  Zone 2: CAUTION Call your doctor's office for any of the following:  . BP reading is greater than 140 (top number) or greater than 90 (bottom number)  . Stomach pain under your ribs in the middle or right side . Headaches or seeing spots . Feeling nauseated or throwing up . Swelling in face and hands  Zone 3: EMERGENCY  Seek immediate medical care if you have any of the following:  . BP reading is greater than160 (top number) or greater than 110 (bottom number) . Severe headaches not improving with Tylenol . Serious difficulty catching your breath . Any worsening symptoms from Zone 2    Preterm Labor and Birth Information  The normal length of a pregnancy is 39-41 weeks. Preterm labor is when labor starts before 37 completed weeks of pregnancy. What are the risk factors for preterm labor? Preterm labor is more likely to occur in women who:  Have certain infections during pregnancy such as a bladder infection, sexually transmitted infection, or infection inside the uterus (chorioamnionitis).  Have a shorter-than-normal cervix.  Have gone into preterm labor before.  Have had surgery on their cervix.  Are younger than age 42 or older than age 4.  Are African American.  Are pregnant with twins or multiple babies (multiple gestation).  Take street drugs or smoke while pregnant.  Do not gain enough weight while pregnant.  Became pregnant shortly after having been pregnant. What are the symptoms of preterm labor? Symptoms of preterm labor include:  Cramps similar to those that can happen during a menstrual period. The cramps may happen with diarrhea.  Pain in the abdomen or lower back.  Regular uterine contractions that may feel like tightening of the abdomen.  A feeling of increased pressure in the pelvis.  Increased watery or bloody mucus discharge from the vagina.  Water breaking (ruptured amniotic sac). Why is it important to recognize signs of preterm labor? It is important to recognize signs of preterm labor because babies who are born prematurely may not be fully developed. This can put them at an increased risk for:  Long-term (chronic) heart and lung problems.  Difficulty immediately after birth with regulating body systems, including blood sugar, body temperature, heart rate, and breathing rate.  Bleeding in the brain.  Cerebral palsy.  Learning difficulties.  Death. These risks are highest for babies who are born before 21 weeks of pregnancy. How is preterm labor treated? Treatment depends on the length of your pregnancy, your condition,  and the health of your baby. It may involve: 1. Having a stitch (suture) placed in your cervix to prevent your cervix from opening too early (cerclage). 2. Taking or being given medicines, such as: ? Hormone medicines. These may be given early in pregnancy to help support the pregnancy. ? Medicine to stop contractions. ? Medicines to help mature the baby's lungs. These may be prescribed if the risk of delivery is high. ? Medicines to prevent your baby from developing cerebral palsy. If the labor happens before 34 weeks of pregnancy, you may need to stay in the hospital. What should I do if I think I am in preterm labor? If you think that you are going into preterm labor, call your health care provider right away. How can I prevent preterm labor in future pregnancies? To increase your chance of having a full-term pregnancy:  Do not use any tobacco products, such as cigarettes,  chewing tobacco, and e-cigarettes. If you need help quitting, ask your health care provider.  Do not use street drugs or medicines that have not been prescribed to you during your pregnancy.  Talk with your health care provider before taking any herbal supplements, even if you have been taking them regularly.  Make sure you gain a healthy amount of weight during your pregnancy.  Watch for infection. If you think that you might have an infection, get it checked right away.  Make sure to tell your health care provider if you have gone into preterm labor before. This information is not intended to replace advice given to you by your health care provider. Make sure you discuss any questions you have with your health care provider. Document Revised: 03/27/2019 Document Reviewed: 04/25/2016 Elsevier Patient Education  2020 [employer].

## 2020-09-12 NOTE — Progress Notes (Signed)
   LOW-RISK PREGNANCY VISIT Patient name: Kathleen Grimes MRN [id number]  Date of birth: May 28, 2001 Chief Complaint:   Routine Prenatal Visit  History of Present Illness:   Kathleen Grimes is a 19 y.o. G53P0000 female at [redacted]w[redacted]d with an Estimated Date of Delivery: 11/07/20 being seen today for ongoing management of a low-risk pregnancy.  Depression screen [facility] 2/9 04/25/2020  Decreased Interest 3  Down, Depressed, Hopeless 0  PHQ - 2 Score 3  Altered sleeping 0  Tired, decreased energy 1  Change in appetite 2  Feeling bad or failure about yourself  0  Trouble concentrating 0  Moving slowly or fidgety/restless 1  Suicidal thoughts 0  PHQ-9 Score 7    Today she reports some low back pain. Contractions: Not present.  .  Movement: Present. denies leaking of fluid. Review of Systems:   Pertinent items are noted in HPI Denies abnormal vaginal discharge w/ itching/odor/irritation, headaches, visual changes, shortness of breath, chest pain, abdominal pain, severe nausea/vomiting, or problems with urination or bowel movements unless otherwise stated above. Pertinent History Reviewed:  Reviewed past medical,surgical, social, obstetrical and family history.  Reviewed problem list, medications and allergies. Physical Assessment:   Vitals:   09/12/20 0950  BP: (!) 105/57  Pulse: 74  Weight: 168 lb 6.4 oz (76.4 kg)  Body mass index is 26.38 kg/m.        Physical Examination:   General appearance: Well appearing, and in no distress  Mental status: Alert, oriented to person, place, and time  Skin: Warm & dry  Cardiovascular: Normal heart rate noted  Respiratory: Normal respiratory effort, no distress  Abdomen: Soft, gravid, nontender  Pelvic: Cervical exam deferred         Extremities: Edema: None  Fetal Status: Fetal Heart Rate (bpm): 157 Fundal Height: 30 cm Movement: Present    Chaperone: n/a    Results for orders placed or performed in visit on 09/12/20 (from the past 24 hour(s))    POC Urinalysis Dipstick OB   Collection Time: 09/12/20  9:53 AM  Result Value Ref Range   Color, UA     Clarity, UA     Glucose, UA Negative Negative   Bilirubin, UA     Ketones, UA neg    Spec Grav, UA     Blood, UA neg    pH, UA     POC,PROTEIN,UA Negative Negative, Trace, Small (1+), Moderate (2+), Large (3+), 4+   Urobilinogen, UA     Nitrite, UA neg    Leukocytes, UA Negative Negative   Appearance     Odor      Assessment & Plan:  1) Low-risk pregnancy G1P0000 at [redacted]w[redacted]d with an Estimated Date of Delivery: 11/07/20   2) Low back pain, gave printed prevention/relief measures    Meds: No orders of the defined types were placed in this encounter.  Labs/procedures today: none  Plan:  Continue routine obstetrical care  Next visit: prefers in person    Reviewed: Preterm labor symptoms and general obstetric precautions including but not limited to vaginal bleeding, contractions, leaking of fluid and fetal movement were reviewed in detail with the patient.  All questions were answered. Has home bp cuff.  Check bp weekly, let us know if >140/90.   Follow-up: Return in about 2 weeks (around 09/26/2020) for LROB, in person, CNM.  Orders Placed This Encounter  Procedures  . POC Urinalysis Dipstick OB   Cheral Marker CNM, [facility] 09/12/2020 10:07 AM

## 2020-09-28 ENCOUNTER — Encounter: Payer: Self-pay | Admitting: Advanced Practice Midwife

## 2020-09-28 ENCOUNTER — Ambulatory Visit (INDEPENDENT_AMBULATORY_CARE_PROVIDER_SITE_OTHER): Payer: Medicaid Other | Admitting: Advanced Practice Midwife

## 2020-09-28 VITALS — BP 105/68 | HR 72 | Wt 172.0 lb

## 2020-09-28 DIAGNOSIS — Z331 Pregnant state, incidental: Secondary | ICD-10-CM

## 2020-09-28 DIAGNOSIS — Z3403 Encounter for supervision of normal first pregnancy, third trimester: Secondary | ICD-10-CM

## 2020-09-28 DIAGNOSIS — Z3A34 34 weeks gestation of pregnancy: Secondary | ICD-10-CM

## 2020-09-28 DIAGNOSIS — Z1389 Encounter for screening for other disorder: Secondary | ICD-10-CM

## 2020-09-28 LAB — POCT URINALYSIS DIPSTICK OB
Blood, UA: NEGATIVE
Glucose, UA: NEGATIVE
Ketones, UA: NEGATIVE
Leukocytes, UA: NEGATIVE
Nitrite, UA: NEGATIVE
POC,PROTEIN,UA: NEGATIVE

## 2020-09-28 NOTE — Patient Instructions (Signed)
Kathleen Grimes, I greatly value your feedback.  If you receive a survey following your visit with [name] today, we appreciate you taking the time to fill it out.  Thanks, Philipp Deputy CNM   [facility] ([address] [place], [place] [address]) Entrance C, located off of [address] valet parking  Go to [email or web address] to register for FREE online childbirth classes   Call the office [phone]) or go to [facility] if:  You begin to have strong, frequent contractions  Your water breaks.  Sometimes it is a big gush of fluid, sometimes it is just a trickle that keeps getting your panties wet or running down your legs  You have vaginal bleeding.  It is normal to have a small amount of spotting if your cervix was checked.   You don't feel your baby moving like normal.  If you don't, get you something to eat and drink and lay down and focus on feeling your baby move.  You should feel at least 10 movements in 2 hours.  If you don't, you should call the office or go to [facility].    Tdap Vaccine  It is recommended that you get the Tdap vaccine during the third trimester of EACH pregnancy to help protect your baby from getting pertussis (whooping cough)  27-36 weeks is the BEST time to do this so that you can pass the protection on to your baby. During pregnancy is better than after pregnancy, but if you are unable to get it during pregnancy it will be offered at the hospital.   You can get this vaccine with [name], at the health department, your family doctor, or some local pharmacies  Everyone who will be around your baby should also be up-to-date on their vaccines before the baby comes. Adults (who are not pregnant) only need 1 dose of Tdap during adulthood.   [place] Pediatricians/Family Doctors:  [facility] [phone]            [facility] [phone]                 [facility] [phone]  (usually not accepting new patients unless you have family there already, you are always welcome to call and ask)       [facility] Department [phone]       [place] Pediatricians/Family Doctors:   [facility]: [phone]  [facility]: [phone]  [facility]: [phone]  [facility] Doctors:   [facility]: [phone]   [facility]: [phone]  [facility] Doctors:  [facility]: [phone]   Home Blood Pressure Monitoring for Patients   Your provider has recommended that you check your blood pressure (BP) at least once a week at home. If you do not have a blood pressure cuff at home, one will be provided for you. Contact your provider if you have not received your monitor within 1 week.   Helpful Tips for Accurate Home Blood Pressure Checks  . Don't smoke, exercise, or drink caffeine 30 minutes before checking your BP . Use the restroom before checking your BP (a full bladder can raise your pressure) . Relax in a comfortable upright chair . Feet on the ground . Left arm resting comfortably on a flat surface at the level of your heart . Legs uncrossed . Back supported . Sit quietly and don't talk . Place the cuff on your bare  arm . Adjust snuggly, so that only two fingertips can fit between your skin and the top of the cuff . Check 2 readings separated by at least one minute . Keep a log of your BP readings . For a visual, please reference this diagram: http://ccnc.care/bpdiagram  Provider Name: [facility]     Phone: [phone]  Zone 1: ALL CLEAR  Continue to monitor your symptoms:  . BP reading is less than 140 (top number) or less than 90 (bottom number)  . No right upper stomach pain . No headaches or seeing spots . No feeling nauseated or throwing up . No swelling in face and hands  Zone 2: CAUTION Call your doctor's office for  any of the following:  . BP reading is greater than 140 (top number) or greater than 90 (bottom number)  . Stomach pain under your ribs in the middle or right side . Headaches or seeing spots . Feeling nauseated or throwing up . Swelling in face and hands  Zone 3: EMERGENCY  Seek immediate medical care if you have any of the following:  . BP reading is greater than160 (top number) or greater than 110 (bottom number) . Severe headaches not improving with Tylenol . Serious difficulty catching your breath . Any worsening symptoms from Zone 2   Third Trimester of Pregnancy The third trimester is from week 29 through week 42, months 7 through 9. The third trimester is a time when the fetus is growing rapidly. At the end of the ninth month, the fetus is about 20 inches in length and weighs 6-10 pounds.  BODY CHANGES Your body goes through many changes during pregnancy. The changes vary from woman to woman.   Your weight will continue to increase. You can expect to gain 25-35 pounds (11-16 kg) by the end of the pregnancy.  You may begin to get stretch marks on your hips, abdomen, and breasts.  You may urinate more often because the fetus is moving lower into your pelvis and pressing on your bladder.  You may develop or continue to have heartburn as a result of your pregnancy.  You may develop constipation because certain hormones are causing the muscles that push waste through your intestines to slow down.  You may develop hemorrhoids or swollen, bulging veins (varicose veins).  You may have pelvic pain because of the weight gain and pregnancy hormones relaxing your joints between the bones in your pelvis. Backaches may result from overexertion of the muscles supporting your posture.  You may have changes in your hair. These can include thickening of your hair, rapid growth, and changes in texture. Some women also have hair loss during or after pregnancy, or hair that feels dry or thin.  Your hair will most likely return to normal after your baby is born.  Your breasts will continue to grow and be tender. A yellow discharge may leak from your breasts called colostrum.  Your belly button may stick out.  You may feel short of breath because of your expanding uterus.  You may notice the fetus "dropping," or moving lower in your abdomen.  You may have a bloody mucus discharge. This usually occurs a few days to a week before labor begins.  Your cervix becomes thin and soft (effaced) near your due date. WHAT TO EXPECT AT YOUR PRENATAL EXAMS  You will have prenatal exams every 2 weeks until week 36. Then, you will have weekly prenatal exams. During a routine prenatal visit:  You  will be weighed to make sure you and the fetus are growing normally.  Your blood pressure is taken.  Your abdomen will be measured to track your baby's growth.  The fetal heartbeat will be listened to.  Any test results from the previous visit will be discussed.  You may have a cervical check near your due date to see if you have effaced. At around 36 weeks, your caregiver will check your cervix. At the same time, your caregiver will also perform a test on the secretions of the vaginal tissue. This test is to determine if a type of bacteria, Group B streptococcus, is present. Your caregiver will explain this further. Your caregiver may ask you:  What your birth plan is.  How you are feeling.  If you are feeling the baby move.  If you have had any abnormal symptoms, such as leaking fluid, bleeding, severe headaches, or abdominal cramping.  If you have any questions. Other tests or screenings that may be performed during your third trimester include:  Blood tests that check for low iron levels (anemia).  Fetal testing to check the health, activity level, and growth of the fetus. Testing is done if you have certain medical conditions or if there are problems during the pregnancy. FALSE  LABOR You may feel small, irregular contractions that eventually go away. These are called Braxton Hicks contractions, or false labor. Contractions may last for hours, days, or even weeks before true labor sets in. If contractions come at regular intervals, intensify, or become painful, it is best to be seen by your caregiver.  SIGNS OF LABOR   Menstrual-like cramps.  Contractions that are 5 minutes apart or less.  Contractions that start on the top of the uterus and spread down to the lower abdomen and back.  A sense of increased pelvic pressure or back pain.  A watery or bloody mucus discharge that comes from the vagina. If you have any of these signs before the 37th week of pregnancy, call your caregiver right away. You need to go to the hospital to get checked immediately. HOME CARE INSTRUCTIONS   Avoid all smoking, herbs, alcohol, and unprescribed drugs. These chemicals affect the formation and growth of the baby.  Follow your caregiver's instructions regarding medicine use. There are medicines that are either safe or unsafe to take during pregnancy.  Exercise only as directed by your caregiver. Experiencing uterine cramps is a good sign to stop exercising.  Continue to eat regular, healthy meals.  Wear a good support bra for breast tenderness.  Do not use hot tubs, steam rooms, or saunas.  Wear your seat belt at all times when driving.  Avoid raw meat, uncooked cheese, cat litter boxes, and soil used by cats. These carry germs that can cause birth defects in the baby.  Take your prenatal vitamins.  Try taking a stool softener (if your caregiver approves) if you develop constipation. Eat more high-fiber foods, such as fresh vegetables or fruit and whole grains. Drink plenty of fluids to keep your urine clear or pale yellow.  Take warm sitz baths to soothe any pain or discomfort caused by hemorrhoids. Use hemorrhoid cream if your caregiver approves.  If you develop varicose  veins, wear support hose. Elevate your feet for 15 minutes, 3-4 times a day. Limit salt in your diet.  Avoid heavy lifting, wear low heal shoes, and practice good posture.  Rest a lot with your legs elevated if you have leg cramps or low back  pain.  Visit your dentist if you have not gone during your pregnancy. Use a soft toothbrush to brush your teeth and be gentle when you floss.  A sexual relationship may be continued unless your caregiver directs you otherwise.  Do not travel far distances unless it is absolutely necessary and only with the approval of your caregiver.  Take prenatal classes to understand, practice, and ask questions about the labor and delivery.  Make a trial run to the hospital.  Pack your hospital bag.  Prepare the baby's nursery.  Continue to go to all your prenatal visits as directed by your caregiver. SEEK MEDICAL CARE IF:  You are unsure if you are in labor or if your water has broken.  You have dizziness.  You have mild pelvic cramps, pelvic pressure, or nagging pain in your abdominal area.  You have persistent nausea, vomiting, or diarrhea.  You have a bad smelling vaginal discharge.  You have pain with urination. SEEK IMMEDIATE MEDICAL CARE IF:   You have a fever.  You are leaking fluid from your vagina.  You have spotting or bleeding from your vagina.  You have severe abdominal cramping or pain.  You have rapid weight loss or gain.  You have shortness of breath with chest pain.  You notice sudden or extreme swelling of your face, hands, ankles, feet, or legs.  You have not felt your baby move in over an hour.  You have severe headaches that do not go away with medicine.  You have vision changes. Document Released: 11/27/2001 Document Revised: 12/08/2013 Document Reviewed: 02/03/2013 [employer] Patient Information 2015 [place], [place]. This information is not intended to replace advice given to you by your health care provider. Make  sure you discuss any questions you have with your health care provider.

## 2020-09-28 NOTE — Progress Notes (Signed)
   LOW-RISK PREGNANCY VISIT Patient name: Kathleen Grimes MRN [id number]  Date of birth: 2001-10-17 Chief Complaint:   Routine Prenatal Visit  History of Present Illness:   Kathleen Grimes is a 19 y.o. G31P0000 female at [redacted]w[redacted]d with an Estimated Date of Delivery: 11/07/20 being seen today for ongoing management of a low-risk pregnancy.  Today she reports being woken up a couple of times at night with BH ctx- resolved after an hour. Contractions: Irregular.  .  Movement: Present. denies leaking of fluid. Review of Systems:   Pertinent items are noted in HPI Denies abnormal vaginal discharge w/ itching/odor/irritation, headaches, visual changes, shortness of breath, chest pain, abdominal pain, severe nausea/vomiting, or problems with urination or bowel movements unless otherwise stated above. Pertinent History Reviewed:  Reviewed past medical,surgical, social, obstetrical and family history.  Reviewed problem list, medications and allergies. Physical Assessment:   Vitals:   09/28/20 0952  BP: 105/68  Pulse: 72  Weight: 172 lb (78 kg)  Body mass index is 26.94 kg/m.        Physical Examination:   General appearance: Well appearing, and in no distress  Mental status: Alert, oriented to person, place, and time  Skin: Warm & dry  Cardiovascular: Normal heart rate noted  Respiratory: Normal respiratory effort, no distress  Abdomen: Soft, gravid, nontender  Pelvic: Cervical exam deferred         Extremities: Edema: None  Fetal Status: Fetal Heart Rate (bpm): 140 Fundal Height: 33 cm Movement: Present    Results for orders placed or performed in visit on 09/28/20 (from the past 24 hour(s))  POC Urinalysis Dipstick OB   Collection Time: 09/28/20 10:02 AM  Result Value Ref Range   Color, UA     Clarity, UA     Glucose, UA Negative Negative   Bilirubin, UA     Ketones, UA neg    Spec Grav, UA     Blood, UA neg    pH, UA     POC,PROTEIN,UA Negative Negative, Trace, Small (1+), Moderate  (2+), Large (3+), 4+   Urobilinogen, UA     Nitrite, UA neg    Leukocytes, UA Negative Negative   Appearance     Odor      Assessment & Plan:  1) Low-risk pregnancy G1P0000 at [redacted]w[redacted]d with an Estimated Date of Delivery: 11/07/20   2) BH ctx, reviewed s/s of preterm labor   Meds: No orders of the defined types were placed in this encounter.  Labs/procedures today: none  Plan:  Continue routine obstetrical care   Reviewed: Preterm labor symptoms and general obstetric precautions including but not limited to vaginal bleeding, contractions, leaking of fluid and fetal movement were reviewed in detail with the patient.  All questions were answered. Has home bp cuff. Check bp weekly, let us know if >140/90.   Follow-up: Return in about 2 weeks (around 10/12/2020) for LROB, in person; GBS/cultures.  Orders Placed This Encounter  Procedures  . POC Urinalysis Dipstick OB   Arabella Merles [facility] 09/28/2020 10:17 AM

## 2020-10-12 ENCOUNTER — Other Ambulatory Visit (HOSPITAL_COMMUNITY)
Admission: RE | Admit: 2020-10-12 | Discharge: 2020-10-12 | Disposition: A | Discharge status: Home / Self Care | Payer: Medicaid Other | Source: Ambulatory Visit | Attending: Advanced Practice Midwife | Admitting: Advanced Practice Midwife

## 2020-10-12 ENCOUNTER — Ambulatory Visit (INDEPENDENT_AMBULATORY_CARE_PROVIDER_SITE_OTHER): Payer: Medicaid Other | Admitting: Advanced Practice Midwife

## 2020-10-12 ENCOUNTER — Other Ambulatory Visit: Payer: Self-pay

## 2020-10-12 VITALS — BP 118/67 | HR 86 | Wt 176.0 lb

## 2020-10-12 DIAGNOSIS — Z3A36 36 weeks gestation of pregnancy: Secondary | ICD-10-CM

## 2020-10-12 DIAGNOSIS — Z1389 Encounter for screening for other disorder: Secondary | ICD-10-CM

## 2020-10-12 DIAGNOSIS — Z113 Encounter for screening for infections with a predominantly sexual mode of transmission: Secondary | ICD-10-CM | POA: Insufficient documentation

## 2020-10-12 DIAGNOSIS — Z3403 Encounter for supervision of normal first pregnancy, third trimester: Secondary | ICD-10-CM

## 2020-10-12 DIAGNOSIS — Z331 Pregnant state, incidental: Secondary | ICD-10-CM

## 2020-10-12 LAB — POCT URINALYSIS DIPSTICK OB
Blood, UA: NEGATIVE
Glucose, UA: NEGATIVE
Ketones, UA: NEGATIVE
Leukocytes, UA: NEGATIVE
Nitrite, UA: NEGATIVE
POC,PROTEIN,UA: NEGATIVE

## 2020-10-12 NOTE — Progress Notes (Signed)
   LOW-RISK PREGNANCY VISIT Patient name: Kathleen Grimes MRN [id number]  Date of birth: January 22, 2001 Chief Complaint:   Routine Prenatal Visit (Cultures)  History of Present Illness:   Kathleen Grimes is a 19 y.o. G19P0000 female at [redacted]w[redacted]d with an Estimated Date of Delivery: 11/07/20 being seen today for ongoing management of a low-risk pregnancy.  Today she reports doing well; doesn't have questions today; is thinking about [facility] in [place]- plans to call soon. Contractions: Not present. Vag. Bleeding: None.  Movement: Present. denies leaking of fluid. Review of Systems:   Pertinent items are noted in HPI Denies abnormal vaginal discharge w/ itching/odor/irritation, headaches, visual changes, shortness of breath, chest pain, abdominal pain, severe nausea/vomiting, or problems with urination or bowel movements unless otherwise stated above. Pertinent History Reviewed:  Reviewed past medical,surgical, social, obstetrical and family history.  Reviewed problem list, medications and allergies. Physical Assessment:   Vitals:   10/12/20 1012  BP: 118/67  Pulse: 86  Weight: 176 lb (79.8 kg)  Body mass index is 27.57 kg/m.        Physical Examination:   General appearance: Well appearing, and in no distress  Mental status: Alert, oriented to person, place, and time  Skin: Warm & dry  Cardiovascular: Normal heart rate noted  Respiratory: Normal respiratory effort, no distress  Abdomen: Soft, gravid, nontender  Pelvic: Cervical exam performed  Dilation: Closed Effacement (%): Thick Station: -3  Extremities: Edema: None  Fetal Status: Fetal Heart Rate (bpm): 137 Fundal Height: 35 cm Movement: Present Presentation: Vertex  Results for orders placed or performed in visit on 10/12/20 (from the past 24 hour(s))  POC Urinalysis Dipstick OB   Collection Time: 10/12/20 10:16 AM  Result Value Ref Range   Color, UA     Clarity, UA     Glucose, UA Negative Negative   Bilirubin, UA       Ketones, UA neg    Spec Grav, UA     Blood, UA neg    pH, UA     POC,PROTEIN,UA Negative Negative, Trace, Small (1+), Moderate (2+), Large (3+), 4+   Urobilinogen, UA     Nitrite, UA neg    Leukocytes, UA Negative Negative   Appearance     Odor      Assessment & Plan:  1) Low-risk pregnancy G1P0000 at [redacted]w[redacted]d with an Estimated Date of Delivery: 11/07/20     Meds: No orders of the defined types were placed in this encounter.  Labs/procedures today: GBS/GC/chlam  Plan:  Continue routine obstetrical care   Reviewed: Term labor symptoms and general obstetric precautions including but not limited to vaginal bleeding, contractions, leaking of fluid and fetal movement were reviewed in detail with the patient.  All questions were answered. Has home bp cuff. Check bp weekly, let us know if >140/90.   Follow-up: Return in about 1 week (around 10/19/2020) for LROB, in person.  Orders Placed This Encounter  Procedures  . Culture, beta strep (group b only)  . POC Urinalysis Dipstick OB   Arabella Merles [facility] 10/12/2020 10:43 AM

## 2020-10-12 NOTE — Patient Instructions (Signed)
Braxton Hicks Contractions °Contractions of the uterus can occur throughout pregnancy, but they are not always a sign that you are in labor. You may have practice contractions called Braxton Hicks contractions. These false labor contractions are sometimes confused with true labor. °What are Braxton Hicks contractions? °Braxton Hicks contractions are tightening movements that occur in the muscles of the uterus before labor. Unlike true labor contractions, these contractions do not result in opening (dilation) and thinning of the cervix. Toward the end of pregnancy (32-34 weeks), Braxton Hicks contractions can happen more often and may become stronger. These contractions are sometimes difficult to tell apart from true labor because they can be very uncomfortable. You should not feel embarrassed if you go to the hospital with false labor. °Sometimes, the only way to tell if you are in true labor is for your health care provider to look for changes in the cervix. The health care provider will do a physical exam and may monitor your contractions. If you are not in true labor, the exam should show that your cervix is not dilating and your water has not broken. °If there are no other health problems associated with your pregnancy, it is completely safe for you to be sent home with false labor. You may continue to have Braxton Hicks contractions until you go into true labor. °How to tell the difference between true labor and false labor °True labor °· Contractions last 30-70 seconds. °· Contractions become very regular. °· Discomfort is usually felt in the top of the uterus, and it spreads to the lower abdomen and low back. °· Contractions do not go away with walking. °· Contractions usually become more intense and increase in frequency. °· The cervix dilates and gets thinner. °False labor °· Contractions are usually shorter and not as strong as true labor contractions. °· Contractions are usually irregular. °· Contractions  are often felt in the front of the lower abdomen and in the groin. °· Contractions may go away when you walk around or change positions while lying down. °· Contractions get weaker and are shorter-lasting as time goes on. °· The cervix usually does not dilate or become thin. °Follow these instructions at home: ° °· Take over-the-counter and prescription medicines only as told by your health care provider. °· Keep up with your usual exercises and follow other instructions from your health care provider. °· Eat and drink lightly if you think you are going into labor. °· If Braxton Hicks contractions are making you uncomfortable: °? Change your position from lying down or resting to walking, or change from walking to resting. °? Sit and rest in a tub of warm water. °? Drink enough fluid to keep your urine pale yellow. Dehydration may cause these contractions. °? Do slow and deep breathing several times an hour. °· Keep all follow-up prenatal visits as told by your health care provider. This is important. °Contact a health care provider if: °· You have a fever. °· You have continuous pain in your abdomen. °Get help right away if: °· Your contractions become stronger, more regular, and closer together. °· You have fluid leaking or gushing from your vagina. °· You pass blood-tinged mucus (bloody show). °· You have bleeding from your vagina. °· You have low back pain that you never had before. °· You feel your baby’s head pushing down and causing pelvic pressure. °· Your baby is not moving inside you as much as it used to. °Summary °· Contractions that occur before labor are   called Braxton Hicks contractions, false labor, or practice contractions. °· Braxton Hicks contractions are usually shorter, weaker, farther apart, and less regular than true labor contractions. True labor contractions usually become progressively stronger and regular, and they become more frequent. °· Manage discomfort from Braxton Hicks contractions  by changing position, resting in a warm bath, drinking plenty of water, or practicing deep breathing. °This information is not intended to replace advice given to you by your health care provider. Make sure you discuss any questions you have with your health care provider. °Document Revised: 11/15/2017 Document Reviewed: 04/18/2017 °Elsevier Patient Education © 2020 [employer] ° °

## 2020-10-13 LAB — CERVICOVAGINAL ANCILLARY ONLY
Chlamydia: NEGATIVE
Comment: NEGATIVE
Comment: NORMAL
Neisseria Gonorrhea: NEGATIVE

## 2020-10-16 LAB — CULTURE, BETA STREP (GROUP B ONLY): Strep Gp B Culture: NEGATIVE

## 2020-10-21 ENCOUNTER — Encounter: Payer: Medicaid Other | Admitting: Advanced Practice Midwife

## 2020-10-24 ENCOUNTER — Ambulatory Visit (INDEPENDENT_AMBULATORY_CARE_PROVIDER_SITE_OTHER): Payer: Medicaid Other | Admitting: Advanced Practice Midwife

## 2020-10-24 ENCOUNTER — Encounter: Payer: Self-pay | Admitting: Advanced Practice Midwife

## 2020-10-24 VITALS — BP 116/77 | HR 96 | Wt 178.0 lb

## 2020-10-24 DIAGNOSIS — O26893 Other specified pregnancy related conditions, third trimester: Secondary | ICD-10-CM

## 2020-10-24 DIAGNOSIS — N898 Other specified noninflammatory disorders of vagina: Secondary | ICD-10-CM

## 2020-10-24 DIAGNOSIS — Z3A38 38 weeks gestation of pregnancy: Secondary | ICD-10-CM

## 2020-10-24 LAB — POCT FERNING: Ferning, POC: NEGATIVE

## 2020-10-24 NOTE — Progress Notes (Signed)
   LOW-RISK PREGNANCY VISIT- work in Patient name: Kathleen Grimes MRN [id number]  Date of birth: [birth year] Chief Complaint:   w/i leaking  History of Present Illness:   Kathleen Grimes is a 19 y.o. G39P0000 female at [redacted]w[redacted]d with an Estimated Date of Delivery: 11/07/20 being seen today for ongoing management of a low-risk pregnancy.  Today she reports some leaking earlier this morning; didn't go thru pants and hasn't really continued; having mild 'period-like' cramps. Contractions: Irritability. Vag. Bleeding: None.  Movement: Present. reports leaking of fluid. Review of Systems:   Pertinent items are noted in HPI Denies abnormal vaginal discharge w/ itching/odor/irritation, headaches, visual changes, shortness of breath, chest pain, abdominal pain, severe nausea/vomiting, or problems with urination or bowel movements unless otherwise stated above. Pertinent History Reviewed:  Reviewed past medical,surgical, social, obstetrical and family history.  Reviewed problem list, medications and allergies. Physical Assessment:   Vitals:   10/24/20 1412  BP: 116/77  Pulse: 96  Weight: 178 lb (80.7 kg)  Body mass index is 27.88 kg/m.        Physical Examination:   General appearance: Well appearing, and in no distress  Mental status: Alert, oriented to person, place, and time  Skin: Warm & dry  Cardiovascular: Normal heart rate noted  Respiratory: Normal respiratory effort, no distress  Abdomen: Soft, gravid, nontender  Pelvic: Cervical exam deferred SSE: neg pool, +white vag d/c, neg fern        Extremities:   tr edema Fetal Status: Fetal Heart Rate (bpm): 152   Movement: Present    Results for orders placed or performed in visit on 10/24/20 (from the past 24 hour(s))  POCT [place]   Collection Time: 10/24/20  2:23 PM  Result Value Ref Range   [place]ning, POC Negative Negative    Assessment & Plan:  1) Low-risk pregnancy G1P0000 at [redacted]w[redacted]d with an Estimated Date of Delivery: 11/07/20   2)  Vaginal d/c, ruled out SROM; labor/leaking/bldg precautions given   Meds: No orders of the defined types were placed in this encounter.  Labs/procedures today: SSE with fern  Plan:  Continue routine obstetrical care   Reviewed: Term labor symptoms and general obstetric precautions including but not limited to vaginal bleeding, contractions, leaking of fluid and fetal movement were reviewed in detail with the patient.  All questions were answered. Has home bp cuff. Check bp weekly, let us know if >140/90.   Follow-up: Return in about 1 week (around 10/31/2020) for LROB, in person (cancel 11/9).  Orders Placed This Encounter  Procedures  . POCT [place]   Arabella Merles [facility] 10/24/2020 2:29 PM

## 2020-10-24 NOTE — Patient Instructions (Signed)
Braxton Hicks Contractions °Contractions of the uterus can occur throughout pregnancy, but they are not always a sign that you are in labor. You may have practice contractions called Braxton Hicks contractions. These false labor contractions are sometimes confused with true labor. °What are Braxton Hicks contractions? °Braxton Hicks contractions are tightening movements that occur in the muscles of the uterus before labor. Unlike true labor contractions, these contractions do not result in opening (dilation) and thinning of the cervix. Toward the end of pregnancy (32-34 weeks), Braxton Hicks contractions can happen more often and may become stronger. These contractions are sometimes difficult to tell apart from true labor because they can be very uncomfortable. You should not feel embarrassed if you go to the hospital with false labor. °Sometimes, the only way to tell if you are in true labor is for your health care provider to look for changes in the cervix. The health care provider will do a physical exam and may monitor your contractions. If you are not in true labor, the exam should show that your cervix is not dilating and your water has not broken. °If there are no other health problems associated with your pregnancy, it is completely safe for you to be sent home with false labor. You may continue to have Braxton Hicks contractions until you go into true labor. °How to tell the difference between true labor and false labor °True labor °· Contractions last 30-70 seconds. °· Contractions become very regular. °· Discomfort is usually felt in the top of the uterus, and it spreads to the lower abdomen and low back. °· Contractions do not go away with walking. °· Contractions usually become more intense and increase in frequency. °· The cervix dilates and gets thinner. °False labor °· Contractions are usually shorter and not as strong as true labor contractions. °· Contractions are usually irregular. °· Contractions  are often felt in the front of the lower abdomen and in the groin. °· Contractions may go away when you walk around or change positions while lying down. °· Contractions get weaker and are shorter-lasting as time goes on. °· The cervix usually does not dilate or become thin. °Follow these instructions at home: ° °· Take over-the-counter and prescription medicines only as told by your health care provider. °· Keep up with your usual exercises and follow other instructions from your health care provider. °· Eat and drink lightly if you think you are going into labor. °· If Braxton Hicks contractions are making you uncomfortable: °? Change your position from lying down or resting to walking, or change from walking to resting. °? Sit and rest in a tub of warm water. °? Drink enough fluid to keep your urine pale yellow. Dehydration may cause these contractions. °? Do slow and deep breathing several times an hour. °· Keep all follow-up prenatal visits as told by your health care provider. This is important. °Contact a health care provider if: °· You have a fever. °· You have continuous pain in your abdomen. °Get help right away if: °· Your contractions become stronger, more regular, and closer together. °· You have fluid leaking or gushing from your vagina. °· You pass blood-tinged mucus (bloody show). °· You have bleeding from your vagina. °· You have low back pain that you never had before. °· You feel your baby’s head pushing down and causing pelvic pressure. °· Your baby is not moving inside you as much as it used to. °Summary °· Contractions that occur before labor are   called Braxton Hicks contractions, false labor, or practice contractions. °· Braxton Hicks contractions are usually shorter, weaker, farther apart, and less regular than true labor contractions. True labor contractions usually become progressively stronger and regular, and they become more frequent. °· Manage discomfort from Braxton Hicks contractions  by changing position, resting in a warm bath, drinking plenty of water, or practicing deep breathing. °This information is not intended to replace advice given to you by your health care provider. Make sure you discuss any questions you have with your health care provider. °Document Revised: 11/15/2017 Document Reviewed: 04/18/2017 °Elsevier Patient Education © 2020 [employer] ° °

## 2020-10-25 ENCOUNTER — Encounter: Payer: Medicaid Other | Admitting: Obstetrics & Gynecology

## 2020-11-01 ENCOUNTER — Other Ambulatory Visit: Payer: Self-pay

## 2020-11-01 ENCOUNTER — Ambulatory Visit (INDEPENDENT_AMBULATORY_CARE_PROVIDER_SITE_OTHER): Payer: Medicaid Other | Admitting: Obstetrics & Gynecology

## 2020-11-01 ENCOUNTER — Encounter: Payer: Self-pay | Admitting: Obstetrics & Gynecology

## 2020-11-01 VITALS — BP 119/65 | HR 75 | Wt 179.0 lb

## 2020-11-01 DIAGNOSIS — Z1389 Encounter for screening for other disorder: Secondary | ICD-10-CM

## 2020-11-01 DIAGNOSIS — Z3A39 39 weeks gestation of pregnancy: Secondary | ICD-10-CM

## 2020-11-01 DIAGNOSIS — Z331 Pregnant state, incidental: Secondary | ICD-10-CM

## 2020-11-01 DIAGNOSIS — Z3403 Encounter for supervision of normal first pregnancy, third trimester: Secondary | ICD-10-CM

## 2020-11-01 LAB — POCT URINALYSIS DIPSTICK OB
Blood, UA: NEGATIVE
Glucose, UA: NEGATIVE
Ketones, UA: NEGATIVE
Leukocytes, UA: NEGATIVE
Nitrite, UA: NEGATIVE
POC,PROTEIN,UA: NEGATIVE

## 2020-11-01 NOTE — Progress Notes (Signed)
   LOW-RISK PREGNANCY VISIT Patient name: Kathleen Grimes MRN [id number]  Date of birth: [birth year] Chief Complaint:   Routine Prenatal Visit  History of Present Illness:   Kathleen Grimes is a 19 y.o. G59P0000 female at [redacted]w[redacted]d with an Estimated Date of Delivery: 11/07/20 being seen today for ongoing management of a low-risk pregnancy.  Depression screen [facility] 2/9 04/25/2020  Decreased Interest 3  Down, Depressed, Hopeless 0  PHQ - 2 Score 3  Altered sleeping 0  Tired, decreased energy 1  Change in appetite 2  Feeling bad or failure about yourself  0  Trouble concentrating 0  Moving slowly or fidgety/restless 1  Suicidal thoughts 0  PHQ-9 Score 7    Today she reports no complaints. Contractions: Irritability. Vag. Bleeding: None.  Movement: Present. denies leaking of fluid. Review of Systems:   Pertinent items are noted in HPI Denies abnormal vaginal discharge w/ itching/odor/irritation, headaches, visual changes, shortness of breath, chest pain, abdominal pain, severe nausea/vomiting, or problems with urination or bowel movements unless otherwise stated above. Pertinent History Reviewed:  Reviewed past medical,surgical, social, obstetrical and family history.  Reviewed problem list, medications and allergies. Physical Assessment:   Vitals:   11/01/20 0916  BP: 119/65  Pulse: 75  Weight: 179 lb (81.2 kg)  Body mass index is 28.04 kg/m.        Physical Examination:   General appearance: Well appearing, and in no distress  Mental status: Alert, oriented to person, place, and time  Skin: Warm & dry  Cardiovascular: Normal heart rate noted  Respiratory: Normal respiratory effort, no distress  Abdomen: Soft, gravid, nontender  Pelvic: Cervical exam deferred         Extremities: Edema: None  Fetal Status: Fetal Heart Rate (bpm): 135 Fundal Height: 36 cm Movement: Present Presentation: Vertex  Chaperone: n/a    Results for orders placed or performed in visit on 11/01/20 (from the  past 24 hour(s))  POC Urinalysis Dipstick OB   Collection Time: 11/01/20  9:21 AM  Result Value Ref Range   Color, UA     Clarity, UA     Glucose, UA Negative Negative   Bilirubin, UA     Ketones, UA neg    Spec Grav, UA     Blood, UA neg    pH, UA     POC,PROTEIN,UA Negative Negative, Trace, Small (1+), Moderate (2+), Large (3+), 4+   Urobilinogen, UA     Nitrite, UA neg    Leukocytes, UA Negative Negative   Appearance     Odor      Assessment & Plan:  1) Low-risk pregnancy G1P0000 at [redacted]w[redacted]d with an Estimated Date of Delivery: 11/07/20   2) ,    Meds: No orders of the defined types were placed in this encounter.  Labs/procedures today:   Plan:  Continue routine obstetrical care  Next visit: prefers in person    Reviewed: Term labor symptoms and general obstetric precautions including but not limited to vaginal bleeding, contractions, leaking of fluid and fetal movement were reviewed in detail with the patient.  All questions were answered. Has home bp cuff. Rx faxed to  Check bp weekly, let us know if >140/90.   Follow-up: Return in about 1 week (around 11/08/2020) for NST, LROB.  Orders Placed This Encounter  Procedures  . POC Urinalysis Dipstick OB    Lazaro Arms, MD 11/01/2020 9:53 AM

## 2020-11-09 ENCOUNTER — Telehealth (HOSPITAL_COMMUNITY): Payer: Self-pay | Admitting: *Deleted

## 2020-11-09 ENCOUNTER — Encounter (HOSPITAL_COMMUNITY): Payer: Self-pay | Admitting: *Deleted

## 2020-11-09 ENCOUNTER — Encounter: Payer: Self-pay | Admitting: Advanced Practice Midwife

## 2020-11-09 ENCOUNTER — Ambulatory Visit (INDEPENDENT_AMBULATORY_CARE_PROVIDER_SITE_OTHER): Payer: Medicaid Other | Admitting: Advanced Practice Midwife

## 2020-11-09 ENCOUNTER — Other Ambulatory Visit: Payer: Self-pay

## 2020-11-09 VITALS — BP 115/73 | HR 77 | Wt 179.5 lb

## 2020-11-09 DIAGNOSIS — Z3A40 40 weeks gestation of pregnancy: Secondary | ICD-10-CM

## 2020-11-09 DIAGNOSIS — F1290 Cannabis use, unspecified, uncomplicated: Secondary | ICD-10-CM

## 2020-11-09 DIAGNOSIS — Z3403 Encounter for supervision of normal first pregnancy, third trimester: Secondary | ICD-10-CM

## 2020-11-09 DIAGNOSIS — Z331 Pregnant state, incidental: Secondary | ICD-10-CM

## 2020-11-09 DIAGNOSIS — Z1389 Encounter for screening for other disorder: Secondary | ICD-10-CM

## 2020-11-09 LAB — POCT URINALYSIS DIPSTICK OB
Blood, UA: NEGATIVE
Glucose, UA: NEGATIVE
Ketones, UA: NEGATIVE
Nitrite, UA: NEGATIVE
POC,PROTEIN,UA: NEGATIVE

## 2020-11-09 NOTE — Patient Instructions (Signed)

## 2020-11-09 NOTE — Telephone Encounter (Signed)
Preadmission screen  

## 2020-11-09 NOTE — Progress Notes (Signed)
   PRENATAL VISIT NOTE  Subjective:  Kathleen Grimes is a 19 y.o. G1P0000 at [redacted]w[redacted]d being seen today for ongoing prenatal care.  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  Patient reports no complaints.  Contractions: Irregular. Vag. Bleeding: None.  Movement: Present. Denies leaking of fluid.   The following portions of the patient's history were reviewed and updated as appropriate: allergies, current medications, past family history, past medical history, past social history, past surgical history and problem list.   Objective:   Vitals:   11/09/20 0918  BP: 115/73  Pulse: 77  Weight: 179 lb 8 oz (81.4 kg)    Fetal Status:     Movement: Present     General:  Alert, oriented and cooperative. Patient is in no acute distress.  Skin: Skin is warm and dry. No rash noted.   Cardiovascular: Normal heart rate noted  Respiratory: Normal respiratory effort, no problems with respiration noted  Abdomen: Soft, gravid, appropriate for gestational age.  Pain/Pressure: Present     Pelvic: Cervical exam deferred        Extremities: Normal range of motion.  Edema: None  Mental Status: Normal mood and affect. Normal behavior. Normal judgment and thought content.    NST:  Baseline: 125 Variability: moderate Accels: 15x15 Decels: none Toco: rare Reactive/Appropriate for GA  Assessment and Plan:  Pregnancy: G1P0000 at [redacted]w[redacted]d 1. Encounter for supervision of normal first pregnancy in third trimester - IOL scheduled for 11/14/2020, daytime  - IOL orders placed  2. Screening for genitourinary  condition - POC Urinalysis Dipstick OB  3. Pregnant state, incidental   4. [redacted] weeks gestation of pregnancy - NST today    5. Marijuana use   Term labor symptoms and general obstetric precautions including but not limited to vaginal bleeding, contractions, leaking of fluid and fetal movement were reviewed in detail with the patient. Please refer to After Visit Summary for other counseling recommendations.   Return in about 6 weeks (around 12/21/2020).  Future Appointments  Date Time Provider Department Center  11/14/2020  6:30 AM [facility] SCHED ROOM [facility] None    Thressa Sheller, CNM

## 2020-11-12 ENCOUNTER — Other Ambulatory Visit: Payer: Self-pay | Admitting: Advanced Practice Midwife

## 2020-11-14 ENCOUNTER — Other Ambulatory Visit: Payer: Self-pay

## 2020-11-14 ENCOUNTER — Encounter (HOSPITAL_COMMUNITY): Payer: Self-pay | Admitting: Family Medicine

## 2020-11-14 ENCOUNTER — Inpatient Hospital Stay (HOSPITAL_COMMUNITY)
Admission: AD | Admit: 2020-11-14 | Discharge: 2020-11-16 | DRG: 807 | Disposition: A | Discharge status: Home / Self Care | Payer: Medicaid Other | Attending: Obstetrics and Gynecology | Admitting: Obstetrics and Gynecology

## 2020-11-14 ENCOUNTER — Inpatient Hospital Stay (HOSPITAL_COMMUNITY): Payer: Medicaid Other

## 2020-11-14 DIAGNOSIS — Z20822 Contact with and (suspected) exposure to covid-19: Secondary | ICD-10-CM | POA: Diagnosis present

## 2020-11-14 DIAGNOSIS — O480 Post-term pregnancy: Secondary | ICD-10-CM | POA: Diagnosis present

## 2020-11-14 DIAGNOSIS — Z3A41 41 weeks gestation of pregnancy: Secondary | ICD-10-CM

## 2020-11-14 DIAGNOSIS — Q992 Fragile X chromosome: Secondary | ICD-10-CM | POA: Diagnosis not present

## 2020-11-14 LAB — COMPREHENSIVE METABOLIC PANEL
ALT: 25 U/L (ref 0–44)
AST: 23 U/L (ref 15–41)
Albumin: 2.8 g/dL — ABNORMAL LOW (ref 3.5–5.0)
Alkaline Phosphatase: 143 U/L — ABNORMAL HIGH (ref 38–126)
Anion gap: 8 (ref 5–15)
BUN: 5 mg/dL — ABNORMAL LOW (ref 6–20)
CO2: 24 mmol/L (ref 22–32)
Calcium: 8.5 mg/dL — ABNORMAL LOW (ref 8.9–10.3)
Chloride: 103 mmol/L (ref 98–111)
Creatinine, Ser: 0.53 mg/dL (ref 0.44–1.00)
GFR, Estimated: >60 mL/min (ref >60)
Glucose, Bld: 80 mg/dL (ref 70–99)
Potassium: 3.7 mmol/L (ref 3.5–5.1)
Sodium: 135 mmol/L (ref 135–145)
Total Bilirubin: 0.5 mg/dL (ref 0.3–1.2)
Total Protein: 6.1 g/dL — ABNORMAL LOW (ref 6.5–8.1)

## 2020-11-14 LAB — CBC
HCT: 40.1 % (ref 36.0–46.0)
Hemoglobin: 13.6 g/dL (ref 12.0–15.0)
MCH: 32.2 pg (ref 26.0–34.0)
MCHC: 33.9 g/dL (ref 30.0–36.0)
MCV: 95 fL (ref 80.0–100.0)
Platelets: 232 10*3/uL (ref 150–400)
RBC: 4.22 MIL/uL (ref 3.87–5.11)
RDW: 13.2 % (ref 11.5–15.5)
WBC: 6.8 10*3/uL (ref 4.0–10.5)
nRBC: 0 % (ref 0.0–0.2)

## 2020-11-14 LAB — RESP PANEL BY RT-PCR (FLU A&B, COVID) ARPGX2
Influenza A by PCR: NEGATIVE
Influenza B by PCR: NEGATIVE
SARS Coronavirus 2 by RT PCR: NEGATIVE

## 2020-11-14 LAB — TYPE AND SCREEN
ABO/RH(D): O POS
Antibody Screen: NEGATIVE

## 2020-11-14 MED ORDER — LACTATED RINGERS IV SOLN: 500.0000 mL | INTRAVENOUS | Status: DC | PRN

## 2020-11-14 MED ORDER — ONDANSETRON HCL 4 MG PO TABS: 4.0000 mg | ORAL_TABLET | ORAL | Status: DC | PRN

## 2020-11-14 MED ORDER — PROMETHAZINE HCL 25 MG/ML IJ SOLN
25.0000 mg | Freq: Once | INTRAMUSCULAR | Status: AC
  Administered 2020-11-14: 25 mg via INTRAVENOUS
  Filled 2020-11-14: qty 1

## 2020-11-14 MED ORDER — COCONUT OIL OIL
1.0000 "application " | TOPICAL_OIL | Status: DC | PRN
  Administered 2020-11-16: 1 via TOPICAL

## 2020-11-14 MED ORDER — OXYCODONE-ACETAMINOPHEN 5-325 MG PO TABS: 1.0000 | ORAL_TABLET | ORAL | Status: DC | PRN

## 2020-11-14 MED ORDER — TETANUS-DIPHTH-ACELL PERTUSSIS 5-2.5-18.5 LF-MCG/0.5 IM SUSY: 0.5000 mL | PREFILLED_SYRINGE | Freq: Once | INTRAMUSCULAR | Status: DC

## 2020-11-14 MED ORDER — DIPHENHYDRAMINE HCL 50 MG/ML IJ SOLN: 12.5000 mg | INTRAMUSCULAR | Status: DC | PRN

## 2020-11-14 MED ORDER — OXYTOCIN-SODIUM CHLORIDE 30-0.9 UT/500ML-% IV SOLN
2.5000 [IU]/h | INTRAVENOUS | Status: DC
  Administered 2020-11-14: 2.5 [IU]/h via INTRAVENOUS

## 2020-11-14 MED ORDER — BENZOCAINE-MENTHOL 20-0.5 % EX AERO
1.0000 "application " | INHALATION_SPRAY | CUTANEOUS | Status: DC | PRN
  Filled 2020-11-14: qty 56

## 2020-11-14 MED ORDER — MISOPROSTOL 50MCG HALF TABLET
100.0000 ug | ORAL_TABLET | Freq: Once | ORAL | Status: AC
  Administered 2020-11-14: 100 ug via BUCCAL
  Filled 2020-11-14: qty 2

## 2020-11-14 MED ORDER — DIPHENHYDRAMINE HCL 25 MG PO CAPS: 25.0000 mg | ORAL_CAPSULE | Freq: Four times a day (QID) | ORAL | Status: DC | PRN

## 2020-11-14 MED ORDER — LACTATED RINGERS IV SOLN: 500.0000 mL | Freq: Once | INTRAVENOUS | Status: DC

## 2020-11-14 MED ORDER — OXYCODONE-ACETAMINOPHEN 5-325 MG PO TABS: 2.0000 | ORAL_TABLET | ORAL | Status: DC | PRN

## 2020-11-14 MED ORDER — IBUPROFEN 600 MG PO TABS
600.0000 mg | ORAL_TABLET | Freq: Four times a day (QID) | ORAL | Status: DC
  Administered 2020-11-15 – 2020-11-16 (×6): 600 mg via ORAL
  Filled 2020-11-14 (×7): qty 1

## 2020-11-14 MED ORDER — ONDANSETRON HCL 4 MG/2ML IJ SOLN
4.0000 mg | Freq: Four times a day (QID) | INTRAMUSCULAR | Status: DC | PRN
  Administered 2020-11-14: 4 mg via INTRAVENOUS
  Filled 2020-11-14: qty 2

## 2020-11-14 MED ORDER — ONDANSETRON HCL 4 MG/2ML IJ SOLN
4.0000 mg | INTRAMUSCULAR | Status: DC | PRN
  Administered 2020-11-14: 4 mg via INTRAVENOUS
  Filled 2020-11-14: qty 2

## 2020-11-14 MED ORDER — MISOPROSTOL 50MCG HALF TABLET: 50.0000 ug | ORAL_TABLET | ORAL | Status: DC | PRN

## 2020-11-14 MED ORDER — DIBUCAINE (PERIANAL) 1 % EX OINT
1.0000 "application " | TOPICAL_OINTMENT | CUTANEOUS | Status: DC | PRN
  Administered 2020-11-15: 1 via RECTAL
  Filled 2020-11-14: qty 28

## 2020-11-14 MED ORDER — MEASLES, MUMPS & RUBELLA VAC IJ SOLR: 0.5000 mL | Freq: Once | INTRAMUSCULAR | Status: DC

## 2020-11-14 MED ORDER — FENTANYL-BUPIVACAINE-NACL 0.5-0.125-0.9 MG/250ML-% EP SOLN: 12.0000 mL/h | EPIDURAL | Status: DC | PRN

## 2020-11-14 MED ORDER — OXYTOCIN-SODIUM CHLORIDE 30-0.9 UT/500ML-% IV SOLN
1.0000 m[IU]/min | INTRAVENOUS | Status: DC
  Administered 2020-11-14: 2 m[IU]/min via INTRAVENOUS
  Filled 2020-11-14: qty 500

## 2020-11-14 MED ORDER — EPHEDRINE 5 MG/ML INJ: 10.0000 mg | INTRAVENOUS | Status: DC | PRN

## 2020-11-14 MED ORDER — LACTATED RINGERS IV SOLN: INTRAVENOUS | Status: DC

## 2020-11-14 MED ORDER — PHENYLEPHRINE 40 MCG/ML (10ML) SYRINGE FOR IV PUSH (FOR BLOOD PRESSURE SUPPORT): 80.0000 ug | PREFILLED_SYRINGE | INTRAVENOUS | Status: DC | PRN

## 2020-11-14 MED ORDER — TERBUTALINE SULFATE 1 MG/ML IJ SOLN: 0.2500 mg | Freq: Once | INTRAMUSCULAR | Status: DC | PRN

## 2020-11-14 MED ORDER — SOD CITRATE-CITRIC ACID 500-334 MG/5ML PO SOLN: 30.0000 mL | ORAL | Status: DC | PRN

## 2020-11-14 MED ORDER — OXYTOCIN BOLUS FROM INFUSION
333.0000 mL | Freq: Once | INTRAVENOUS | Status: AC
  Administered 2020-11-14: 333 mL via INTRAVENOUS

## 2020-11-14 MED ORDER — SENNOSIDES-DOCUSATE SODIUM 8.6-50 MG PO TABS
2.0000 | ORAL_TABLET | ORAL | Status: DC
  Administered 2020-11-16: 2 via ORAL
  Filled 2020-11-14: qty 2

## 2020-11-14 MED ORDER — WITCH HAZEL-GLYCERIN EX PADS: 1.0000 "application " | MEDICATED_PAD | CUTANEOUS | Status: DC | PRN

## 2020-11-14 MED ORDER — PRENATAL MULTIVITAMIN CH
1.0000 | ORAL_TABLET | Freq: Every day | ORAL | Status: DC
  Administered 2020-11-15 – 2020-11-16 (×2): 1 via ORAL
  Filled 2020-11-14 (×2): qty 1

## 2020-11-14 MED ORDER — SIMETHICONE 80 MG PO CHEW: 80.0000 mg | CHEWABLE_TABLET | ORAL | Status: DC | PRN

## 2020-11-14 MED ORDER — ACETAMINOPHEN 325 MG PO TABS
650.0000 mg | ORAL_TABLET | ORAL | Status: DC | PRN
  Administered 2020-11-15 (×2): 650 mg via ORAL
  Filled 2020-11-14 (×2): qty 2

## 2020-11-14 MED ORDER — ACETAMINOPHEN 325 MG PO TABS: 650.0000 mg | ORAL_TABLET | ORAL | Status: DC | PRN

## 2020-11-14 MED ORDER — LIDOCAINE HCL (PF) 1 % IJ SOLN
30.0000 mL | INTRAMUSCULAR | Status: AC | PRN
  Administered 2020-11-14: 30 mL via SUBCUTANEOUS
  Filled 2020-11-14: qty 30

## 2020-11-14 MED ORDER — FENTANYL CITRATE (PF) 100 MCG/2ML IJ SOLN
100.0000 ug | INTRAMUSCULAR | Status: DC | PRN
  Administered 2020-11-14: 100 ug via INTRAVENOUS
  Filled 2020-11-14: qty 2

## 2020-11-14 NOTE — Discharge Summary (Signed)
Postpartum Discharge Summary  Date of Service updated12/1/21     Patient Name: Kathleen Grimes DOB: Jul 10, 2001 MRN: [id number]  Date of admission: 11/14/2020 Delivery date:11/14/2020  Delivering provider: Randa Ngo  Date of discharge: 11/16/2020  Admitting diagnosis: Indication for care in labor or delivery [O75.9] Intrauterine pregnancy: [redacted]w[redacted]d    Secondary diagnosis:  Active Problems:   Indication for care in labor or delivery   Vaginal delivery  Additional problems: none    Discharge diagnosis: Term Pregnancy Delivered                                              Post partum procedures:none Augmentation: Pitocin and Cytotec Complications: None  Hospital course: Induction of Labor With Vaginal Delivery   19y.o. yo G1P0000 at 458w0das admitted to the hospital 11/14/2020 for induction of labor.  Indication for induction: Postdates.  Patient had an uncomplicated labor course as follows: Membrane Rupture Time/Date: 7:33 PM ,11/14/2020   Delivery Method:Vaginal, Spontaneous  Episiotomy: None  Lacerations:  1st degree  Details of delivery can be found in separate delivery note.  Patient had a routine postpartum course. Patient is discharged home 11/16/20.  Newborn Data: Birth date:11/14/2020  Birth time:7:33 PM  Gender:Female  Living status:Living  Apgars:9 ,9  Weight:3416 g   Magnesium Sulfate received: No BMZ received: No Rhophylac:N/A MMR:N/A T-DaP:Given prenatally Flu: No Transfusion:No  Physical exam  Vitals:   11/15/20 0845 11/15/20 1311 11/15/20 2031 11/16/20 0540  BP: 95/69 110/73 105/70 116/63  Pulse: 74 66 60 78  Resp:  _0 Temp: 98.1 F (36.7 C) 98.2 F (36.8 C) 98.5 F (36.9 C) 98.4 F (36.9 C)  TempSrc:  Oral Oral Oral  SpO2: 98% 98% 100% 100%  Weight:      Height:       General: alert, cooperative and no distress Lochia: appropriate Uterine Fundus: firm Incision: N/A DVT Evaluation: No evidence of DVT seen on physical  exam. Labs: Lab Results  Component Value Date   WBC 6.8 11/14/2020   HGB 13.6 11/14/2020   HCT 40.1 11/14/2020   MCV 95.0 11/14/2020   PLT 232 11/14/2020   CMP Latest Ref Rng & Units 11/14/2020  Glucose 70 - 99 mg/dL 80  BUN 6 - 20 mg/dL 5(L)  Creatinine 0.44 - 1.00 mg/dL 0.53  Sodium 135 - 145 mmol/L 135  Potassium 3.5 - 5.1 mmol/L 3.7  Chloride 98 - 111 mmol/L 103  CO2 22 - 32 mmol/L 24  Calcium 8.9 - 10.3 mg/dL 8.5(L)  Total Protein 6.5 - 8.1 g/dL 6.1(L)  Total Bilirubin 0.3 - 1.2 mg/dL 0.5  Alkaline Phos 38 - 126 U/L 143(H)  AST 15 - 41 U/L 23  ALT 0 - 44 U/L 25   Edinburgh Score: Edinburgh Postnatal Depression Scale Screening Tool 11/15/2020  I have been able to laugh and see the funny side of things. (No Data)     After visit meds:  Allergies as of 11/16/2020      Reactions   Hydrocodone Nausea Only   Hydrocodone Nausea And Vomiting      Medication List    TAKE these medications   Blood Pressure Monitor Misc For regular home bp monitoring during pregnancy   ibuprofen 600 MG tablet Commonly known as: ADVIL Take 1 tablet (600 mg total) by mouth every  6 (six) hours.   prenatal multivitamin Tabs tablet Take 2 tablets by mouth daily at 12 noon. Gummies        Discharge home in stable condition Infant Feeding: Breast Infant Disposition:home with mother Discharge instruction: per After Visit Summary and Postpartum booklet. Activity: Advance as tolerated. Pelvic rest for 6 weeks.  Diet: routine diet Future Appointments: Future Appointments  Date Time Provider [place]  12/26/2020  1:30 PM Roma Schanz, CNM [facility]   Follow up Visit:  Follow-up Information    [facility]. Schedule an appointment as soon as possible for a visit in 4 week(s).   Contact information: [address] [place] [address] [phone]               Please schedule this patient for a Virtual postpartum visit in 4  weeks with the following provider: Any provider. Additional Postpartum F/U:  Low risk pregnancy complicated by: nothing Delivery mode:  Vaginal, Spontaneous  Anticipated Birth Control:  none   11/16/2020 Hansel Feinstein, CNM
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
## 2020-11-14 NOTE — Progress Notes (Signed)
Labor Progress Note Kathleen Grimes is a 19 y.o. G1P0000 at [redacted]w[redacted]d presented for IOL for post dates.  S: Patient is doing great. Starting to feel contractions more consistently. No leakage of fluid. No headaches.   O:  BP 118/77   Pulse 85   Temp 98.6 F (37 C) (Oral)   Resp 20   Ht 5\' 7"  (1.702 m)   Wt 82.7 kg   LMP 01/18/2020   SpO2 100%   BMI 28.55 kg/m  EFM: 135/moderate variability/+accels, no decels  CVE: Dilation: 3 Effacement (%): 70 Station: -2 Presentation: Vertex Exam by:: Dr. [email or web address]    A&P: 19 y.o. G1P0000 [redacted]w[redacted]d here for IOL 2/2 to post dates.   #Labor: Progressing well. Will start low dose pitocin based on last SVE. Will consider AROM at next check. #Pain: prn IV pain meds #FWB: cat 1 strip #GBS negative  [redacted]w[redacted]d, MD 4:40 PM

## 2020-11-15 LAB — RPR: RPR Ser Ql: NONREACTIVE

## 2020-11-15 NOTE — Clinical Social Work Maternal (Signed)
CLINICAL SOCIAL WORK MATERNAL/CHILD NOTE  Patient Details  Name: Kathleen Grimes MRN: [id number] Date of Birth: 03/03/2001  Date:  11/15/2020  Clinical Social Worker Initiating Note:  Darra Lis Date/Time: Initiated:  11/15/20/0930     Child's Name:  Kathleen Grimes   Biological Parents:  Mother   Need for Interpreter:  None   Reason for Referral:  Current Substance Use/Substance Use During Pregnancy    Address:  [address] [place] [address]    Phone number:  [phone] (home)     Additional phone number:   Household Members/Support Persons (HM/SP):   Household Member/Support Person 1, Household Member/Support Person 2   HM/SP Name Relationship DOB or Age  HM/SP -1 Jhania Etherington Father 02/23/1979  HM/SP -2 Mendel Ryder Husky[place]Diggs Mother 05/19/1982  HM/SP -3        HM/SP -4        HM/SP -5        HM/SP -6        HM/SP -7        HM/SP -8          Natural Supports (not living in the home):  Parent   Professional Supports: None   Employment: Full-time   Type of Work: [employer]   Education:  [employer] school graduate   Homebound arranged:    Museum/gallery curator Resources:  Medicaid   Other Resources:      Cultural/Religious Considerations Which May Impact Care:    Strengths:  Ability to meet basic needs , Engineer, materials, Home prepared for child    Psychotropic Medications:         Pediatrician:    [place]  Pediatrician List:   [place] Other ([facility])  [facility]      Pediatrician Fax Number:    Risk Factors/Current Problems:  None   Cognitive State:  Alert , Linear Thinking , Insightful    Mood/Affect:  Calm    CSW Assessment: CSW consulted for THC use during pregnancy. CSW met with MOB to offer support and complete assessment. CSW entered room and observed MOB holding newborn. CSW introduced self and role. CSW informed MOB of reason for  consult. MOB expressed understanding. MOB reported she resides with her mother and father. MOB is employed full-time with [employer] and receives [employer]. CSW informed MOB to contact DSS if family interested in food stamps. MOB denies any mental health history or current feelings of SI or HI. MOB denies being involved in any DV relationships. CSW provided education regarding the baby blues period vs. perinatal mood disorders, discussed treatment and gave resources for mental health follow up if concerns arise.  CSW recommends self-evaluation during the postpartum time period using the New Mom Checklist from Postpartum Progress and encouraged MOB to contact a medical professional if symptoms are noted at any time.   MOB reported she used THC in the very beginning of pregnancy to help with pregnancy symptoms. MOB could not recall the exact last use. MOB stated she quit at the start of pregnancy and that she does not plan on smoking anymore. MOB denies using any other substances in pregnancy. CSW informed MOB of the hospital drug screen policy. MOB was informed an UDS and CDS will be completed on newborn. MOB informed a CPS report will be made if either results positive for substances. MOB expressed understanding and denied having any questions regarding  the policy.     CSW provided review of Sudden Infant Death Syndrome (SIDS) precautions. MOB reported newborn will sleep in a bassinet. MOB stated she has all of the essential needs for newborn, including a new carseat. MOB has chosen [facility] for follow-up care and denies any barriers.  MOB declined any additional referrals to community resources.   CSW will continue to follow UDS/CDS and make a CPS report if warranted. CSW identifies no further need for intervention and no barriers to discharge at this time.   CSW Plan/Description:  No Further Intervention Required/No Barriers to Discharge, Sudden Infant Death Syndrome (SIDS) Education,  Perinatal Mood and Anxiety Disorder (PMADs) Education, [place], Child Protective Service Report , CSW Will Continue to Monitor Umbilical Cord Tissue Drug Screen Results and Make Report if Larey Days 11/15/2020, 9:56 AM

## 2020-11-15 NOTE — Progress Notes (Addendum)
Post Partum Day 1 Subjective: Kathleen Grimes is a 19 yo G1P1001 s/p VD. She is doing well this morning and reports no events overnight. She is tolerating PO intake with no nausea or vomiting. Pain level is well tolerated. Patient reports no issues with breastfeeding other than figuring out how to be comfortable during the process. No issues with voiding, no BM. Patient denies BC at this time as she plans to not be sexually active. Lochia is decreasing.   Objective: Blood pressure 104/60, pulse 74, temperature 98.9 F (37.2 C), temperature source Oral, resp. rate 18, height '5\' 7"'  (1.702 m), weight 82.7 kg, last menstrual period 01/18/2020, SpO2 98 %, unknown if currently breastfeeding.  Physical Exam:  General: alert, appears stated age and no distress Lochia: appropriate Uterine Fundus: firm DVT Evaluation: No evidence of DVT seen on physical exam. - Negative Homan's sign. - No cords or calf tenderness. - No significant calf/ankle edema.  Recent Labs    11/14/20 1122  HGB 13.6  HCT 40.1    Assessment/Plan:  Kathleen Grimes is a 19 yo G1P1001 s/p VD.  1) Vaginal delivery PPD #1 2) Elevated LFTs during pregnancy  - w/in normal limits as of 08/2020 3) + Intermediate allele Fragile X Syndrome  Plan:  - Medically able to be discharged home. Discharge tomorrow morning.  - Social Work consult prior to discharge - Consent for Circumcision prior to discharge - Contraception has been declined, abstinence.    LOS: 1 day   Laurena Spies PA-S 11/15/2020, 7:58 AM   I personally saw and evaluated the patient, performing the key elements of the service. I developed and verified the management plan that is described in the resident's/student's note, and I agree with the content with my edits above. VSS, HRR&R, Resp unlabored, Legs neg.  Nigel Berthold, CNM 11/17/2020 9:14 AM

## 2020-11-15 NOTE — Social Work (Signed)
CSW attempted to meet with MOB to complete assessment. CSW observed MOB breast feeding newborn. CSW agreed to follow-up.  Asherah Lavoy, LCSWA Clinical Social Work [facility] [phone] 

## 2020-11-15 NOTE — Lactation Note (Signed)
This note was copied from a baby's chart. Lactation Consultation Note Baby 7 hrs old. RN came to [facility] stating mom needed assistance w/lacting, getting upset and discouraged. LC went to room. Mom attempting to latch in cradle position swaddled.  Mom has short shaft nipples. Suggested un-swaddle baby attempt to latch in football position. positioned pillows for support and prop for mom's hand and baby's head. Taught mom to stroke top lip w/nipple and latch. Discussed chin tug and lip flange if needed.  Newborn feeding habits, behavior, STS, I&O, breast massage, assessing for transfer, milk storage, supply and demand discussed. Mom encouraged to feed baby 8-12 times/24 hours and with feeding cues.  MGM watched positioning and latching.  Hand expression demonstrated w/colostrum noted. Praised mom. Hand pump for pre-pumping if needed and shells given to wear in am. Mom has short shaft compressible nipple. Baby latched well and BF well once positioned. Mom denied painful latch. Mom seemed more at ease after baby feeding well. Body alignment discussed.  Encouraged to call for assistance or questions. Lactation brochure given. Reported to RN of consult.  Patient Name: Kathleen Grimes [id number] Date: 11/15/2020 Reason for consult: Initial assessment;Primapara;Term   Maternal Data Has patient been taught Hand Expression?: Yes Does the patient have breastfeeding experience prior to this delivery?: No  Feeding Feeding Type: Breast Fed  LATCH Score Latch: Grasps breast easily, tongue down, lips flanged, rhythmical sucking.  Audible Swallowing: A few with stimulation  Type of Nipple: Everted at rest and after stimulation (short shaft)  Comfort (Breast/Nipple): Soft / non-tender  Hold (Positioning): Assistance needed to correctly position infant at breast and maintain latch.  LATCH Score: 8  Interventions Interventions: Breast feeding basics reviewed;Adjust position;Assisted with  latch;Support pillows;Skin to skin;Position options;Breast massage;Hand express;Pre-pump if needed;Shells;Breast compression;Hand pump  Lactation Tools Discussed/Used Tools: Shells;Pump Shell Type: Inverted Breast pump type: Manual WIC Program: Yes Pump Review: Setup, frequency, and cleaning;Milk Storage Initiated by:: Peri Jefferson RN IBCLC Date initiated:: 11/15/20   Consult Status Consult Status: Follow-up Date: 11/15/20 (in pm) Follow-up type: In-patient    [name] 11/15/2020, 2:36 AM

## 2020-11-15 NOTE — Progress Notes (Signed)
Post Partum Day 1 Subjective: no complaints, up ad lib, voiding and tolerating PO, small lochia, plans to breastfeed, no method  Objective: Blood pressure 104/60, pulse 74, temperature 98.9 F (37.2 C), temperature source Oral, resp. rate 18, height 5\' 7"  (1.702 m), weight 82.7 kg, last menstrual period 01/18/2020, SpO2 98 %, unknown if currently breastfeeding.  Physical Exam:  General: alert, cooperative and no distress Lochia:normal flow Chest: CTAB Heart: RRR no m/r/g Abdomen: +BS, soft, nontender,  Uterine Fundus: firm DVT Evaluation: No evidence of DVT seen on physical exam. Extremities: no edema  Recent Labs    11/14/20 1122  HGB 13.6  HCT 40.1    Assessment/Plan: Plan for discharge tomorrow and Lactation consult   LOS: 1 day   11/16/20 11/15/2020, 7:59 AM

## 2020-11-16 MED ORDER — IBUPROFEN 600 MG PO TABS: 600.0000 mg | ORAL_TABLET | Freq: Four times a day (QID) | ORAL | 0 refills | Status: DC

## 2020-11-16 NOTE — Lactation Note (Signed)
This note was copied from a baby's chart. Lactation Consultation Note Baby 32 hrs old. Attempted to see mom. Mom sleeping. Grandmother awake, gave LC thumbs up.  Patient Name: Kathleen Grimes JJHER'D Date: 11/16/2020     Maternal Data    Feeding    LATCH Score                   Interventions    Lactation Tools Discussed/Used     Consult Status      [name] 11/16/2020, 4:12 AM

## 2020-11-16 NOTE — Lactation Note (Signed)
This note was copied from a baby's chart. Lactation Consultation Note  Patient Name: Kathleen Grimes [id number] Date: 11/16/2020 Reason for consult: Follow-up assessment;Mother's request;Primapara;1st time breastfeeding;Term;Infant weight loss  Infant is 41 weeks and 41 hours old. Mom stating not able to latch infant at the last two feedings at 8 am and 12 pm. Infant went for a circumcision today and has been sleepy since then.   LC examined Mom's breasts. Mom states infant able to latch on the left but not on the right side. Right nipple healed abrasion on lower quadrant. LC alerted nurse to give coconut oil for nipple care. LC able to EBM from both breasts and told Mom to rub into nipples for care. Mom also provided with comfort gels with instructions on how to use them and discard after 6 days. Mom is aware to not use comfort gels with coconut oil   LC attempted to latch infant on the left side. Infant sleepy at the breast. LC expressed 1 ml of colostrum on a spoon and gave via spoon and finger feeding. LC then able to latch infant at the breast on the left but infant will not initiate a suck.   Mom moved infant to the right side and is working on latching infant. LC alerted RN, Levora Angel, that infant has not fed for last for more than 4 hours.   Plan 1. To feed infant based on cues 8-12x in 24 hour period no more than 4 hours without an attempt. Mom to feed infant STS and look for signs of milk transfer.          2. Mom will do breast massage and hand express prior to latching for stimulation.           3. LC reviewed I' and O sheet for Mom to track feedings on discharge.           4. LC brochure of inpatient and outpatient services reviewed.           5. Engorgement signs, treatment and prevention reviewed.   LC returned to check on infant's feedings. Mom stated infant latched and fed for 25 minutes. Feeding was viewed by RN, Candace Cruise.    Kathleen Dobek  Grimes 11/16/2020,  12:55 PM

## 2020-11-16 NOTE — Discharge Instructions (Signed)

## 2020-11-29 DIAGNOSIS — Z029 Encounter for administrative examinations, unspecified: Secondary | ICD-10-CM

## 2020-12-26 ENCOUNTER — Ambulatory Visit (INDEPENDENT_AMBULATORY_CARE_PROVIDER_SITE_OTHER): Payer: Medicaid Other | Admitting: Women's Health

## 2020-12-26 ENCOUNTER — Other Ambulatory Visit: Payer: Self-pay

## 2020-12-26 ENCOUNTER — Encounter: Payer: Self-pay | Admitting: Women's Health

## 2020-12-26 DIAGNOSIS — Z8759 Personal history of other complications of pregnancy, childbirth and the puerperium: Secondary | ICD-10-CM | POA: Diagnosis not present

## 2020-12-26 NOTE — Progress Notes (Signed)
POSTPARTUM VISIT Patient name: Kathleen Grimes MRN [id number]  Date of birth: [birth year] Chief Complaint:   Postpartum Care  History of Present Illness:   Kathleen Grimes is a 20 y.o. G31P1001 Caucasian female being seen today for a postpartum visit. She is 6 weeks postpartum following a spontaneous vaginal delivery at 41.0 gestational weeks. IOL: Yes, for Postdates. Anesthesia: local.  Laceration: 1st degree.  Complications: none. Inpatient contraception: no.   Pregnancy uncomplicated. Tobacco use: no. Substance use disorder: no. Last pap smear: <21yo and results were n/a. Next pap smear due: after 1/18 No LMP recorded.  Postpartum course has been uncomplicated. Bleeding thin lochia. Bowel function is normal. Bladder function is normal. Urinary incontinence? No, fecal incontinence? No Patient is not sexually active. Last sexual activity: prior to birth of baby.  Desired contraception: abstinence, condoms if sexually active. Patient does not want a pregnancy in the near future.  Desired family size is unsure #of children.   Upstream - 12/26/20 1350      Pregnancy Intention Screening   Does the patient want to become pregnant in the next year? No    Does the patient's partner want to become pregnant in the next year? No    Would the patient like to discuss contraceptive options today? No      Contraception Wrap Up   Current Method Abstinence    End Method Abstinence    Contraception Counseling Provided No          The pregnancy intention screening data noted above was reviewed. Potential methods of contraception were discussed. The patient elected to proceed with Abstinence.   Edinburgh Postpartum Depression Screening: negative  Edinburgh Postnatal Depression Scale - 12/26/20 1344      Edinburgh Postnatal Depression Scale:  In the Past 7 Days   I have been able to laugh and see the funny side of things. 0    I have looked forward with enjoyment to things. 0    I have blamed myself  unnecessarily when things went wrong. 0    I have been anxious or worried for no good reason. 0    I have felt scared or panicky for no good reason. 0    Things have been getting on top of me. 0    I have been so unhappy that I have had difficulty sleeping. 0    I have felt sad or miserable. 0    I have been so unhappy that I have been crying. 0    The thought of harming myself has occurred to me. 0    Edinburgh Postnatal Depression Scale Total 0          Baby's course has been uncomplicated. Baby is feeding by breast: milk supply adequate. Infant has a pediatrician/family doctor? Yes.  Childcare strategy if returning to work/school: n/a.  Pt has material needs met for her and baby: Yes.   Review of Systems:   Pertinent items are noted in HPI Denies Abnormal vaginal discharge w/ itching/odor/irritation, headaches, visual changes, shortness of breath, chest pain, abdominal pain, severe nausea/vomiting, or problems with urination or bowel movements. Pertinent History Reviewed:  Reviewed past medical,surgical, obstetrical and family history.  Reviewed problem list, medications and allergies. OB History  Gravida Para Term Preterm AB Living  _0 0 0 1  SAB IAB Ectopic Multiple Live Births  0 0 0 0 1    # Outcome Date GA Lbr Len/2nd Weight Sex Delivery Anes PTL Lv  1 Term 11/14/20 1w0d00:16 / 00:17 7 lb 8.5 oz (3.416 kg) M Vag-Spont Local  LIV   Physical Assessment:   Vitals:   12/26/20 1348  BP: 113/69  Pulse: 83  Weight: 159 lb 8 oz (72.3 kg)  Height: _0  (1.702 m)  Body mass index is 24.98 kg/m.       Physical Examination:   General appearance: alert, well appearing, and in no distress  Mental status: alert, oriented to person, place, and time  Skin: warm & dry   Cardiovascular: normal heart rate noted   Respiratory: normal respiratory effort, no distress   Breasts: deferred, no complaints   Abdomen: soft, non-tender   Pelvic: vulva healing well  Rectal: small  hemorrhoids  Extremities: no edema  Chaperone: JLevy Pupa        No results found for this or any previous visit (from the past 24 hour(s)).  Assessment & Plan:  1) Postpartum exam 2) 6 wks s/p spontaneous vaginal delivery after IOL for postdates 3) breast feeding 4) Depression screening 5) Contraception counseling>abstinence, condoms if active  Essential components of care per ACOG recommendations:  1.  Mood and well being:  . If positive depression screen, discussed and plan developed.  . If using tobacco we discussed reduction/cessation and risk of relapse . If current substance abuse, we discussed and referral to local resources was offered.   2. Infant care and feeding:  . If breastfeeding, discussed returning to work, pumping, breastfeeding-associated pain, guidance regarding return to fertility while lactating if not using another method. If needed, patient was provided with a letter to be allowed to pump q 2-3hrs to support lactation in a private location with access to a refrigerator to store breastmilk.   . Recommended that all caregivers be immunized for flu, pertussis and other preventable communicable diseases . If pt does not have material needs met for her/baby, referred to local resources for help obtaining these.  3. Sexuality, contraception and birth spacing . Provided guidance regarding sexuality, management of dyspareunia, and resumption of intercourse . Discussed avoiding interpregnancy interval <685ms and recommended birth spacing of 18 months  4. Sleep and fatigue . Discussed coping options for fatigue and sleep disruption . Encouraged family/partner/community support of 4 hrs of uninterrupted sleep to help with mood and fatigue  5. Physical recovery  . If pt had a C/S, assessed incisional pain and providing guidance on normal vs prolonged recovery . If pt had a laceration, perineal healing and pain reviewed.  . If urinary or fecal incontinence, discussed  management and referred to PT or uro/gyn if indicated  . Patient is safe to resume physical activity. Discussed attainment of healthy weight.  6.  Chronic disease management . Discussed pregnancy complications if any, and their implications for future childbearing and long-term maternal health. . Review recommendations for prevention of recurrent pregnancy complications, such as 17 hydroxyprogesterone caproate to reduce risk for recurrent PTB not applicable, or aspirin to reduce risk of preeclampsia not applicable. . Pt had GDM: No. If yes, 2hr GTT scheduled: not applicable. . Reviewed medications and non-pregnant dosing including consideration of whether pt is breastfeeding using a reliable resource such as LactMed: not applicable . Referred for f/u w/ PCP or subspecialist providers as indicated: not applicable  7. Health maintenance . Mammogram at 4020yor earlier if indicated . Pap smears as indicated  Meds: No orders of the defined types were placed in this encounter.   Follow-up: Return in about 4 weeks (  around 01/23/2021) for Pap & physical.   No orders of the defined types were placed in this encounter.   [place], [facility] 12/26/2020 2:02 PM

## 2021-02-15 ENCOUNTER — Other Ambulatory Visit: Payer: Medicaid Other | Admitting: Adult Health

## 2021-03-29 ENCOUNTER — Other Ambulatory Visit (HOSPITAL_COMMUNITY)
Admission: RE | Admit: 2021-03-29 | Discharge: 2021-03-29 | Disposition: A | Discharge status: Home / Self Care | Payer: Medicaid Other | Source: Ambulatory Visit | Attending: Adult Health | Admitting: Adult Health

## 2021-03-29 ENCOUNTER — Encounter: Payer: Self-pay | Admitting: Women's Health

## 2021-03-29 ENCOUNTER — Other Ambulatory Visit: Payer: Self-pay

## 2021-03-29 ENCOUNTER — Ambulatory Visit (INDEPENDENT_AMBULATORY_CARE_PROVIDER_SITE_OTHER): Payer: Medicaid Other | Admitting: Women's Health

## 2021-03-29 VITALS — BP 106/65 | HR 73 | Ht 67.0 in | Wt 149.4 lb

## 2021-03-29 DIAGNOSIS — Z01419 Encounter for gynecological examination (general) (routine) without abnormal findings: Secondary | ICD-10-CM | POA: Insufficient documentation

## 2021-03-29 DIAGNOSIS — E039 Hypothyroidism, unspecified: Secondary | ICD-10-CM | POA: Diagnosis not present

## 2021-03-29 DIAGNOSIS — E010 Iodine-deficiency related diffuse (endemic) goiter: Secondary | ICD-10-CM

## 2021-03-29 NOTE — Progress Notes (Signed)
WELL-WOMAN EXAMINATION Patient name: Kathleen Grimes MRN [id number]  Date of birth: 11-13-2001 Chief Complaint:   Gynecologic Exam  History of Present Illness:   Kathleen Grimes is a 20 y.o. G7P1001 Caucasian female being seen today for a routine well-woman exam.  Current complaints: occ sharp pain under Lt breast when taking a deep breath Baby old, still breastfeeding- is planning to sell some to milk bank.   Depression screen [facility] 2/9 03/29/2021 04/25/2020  Decreased Interest 0 3  Down, Depressed, Hopeless 0 0  PHQ - 2 Score 0 3  Altered sleeping 0 0  Tired, decreased energy 0 1  Change in appetite 0 2  Feeling bad or failure about yourself  0 0  Trouble concentrating 0 0  Moving slowly or fidgety/restless 0 1  Suicidal thoughts 0 0  PHQ-9 Score 0 7     PCP: none      does not desire labs No LMP recorded (lmp unknown). The current method of family planning is abstinence.  Last pap never. Results were: N/A. H/O abnormal pap: no Last mammogram: never. Results were: N/A. Family h/o breast cancer: yes PGM Last colonoscopy: never. Results were: N/A. Family h/o colorectal cancer: no Review of Systems:   Pertinent items are noted in HPI Denies any headaches, blurred vision, fatigue, shortness of breath, chest pain, abdominal pain, abnormal vaginal discharge/itching/odor/irritation, problems with periods, bowel movements, urination, or intercourse unless otherwise stated above. Pertinent History Reviewed:  Reviewed past medical,surgical, social and family history.  Reviewed problem list, medications and allergies. Physical Assessment:   Vitals:   03/29/21 1333  BP: 106/65  Pulse: 73  Weight: 149 lb 6.4 oz (67.8 kg)  Height: 5\' 7"  (1.702 m)  Body mass index is 23.4 kg/m.        Physical Examination:   General appearance - well appearing, and in no distress  Mental status - alert, oriented to person, place, and time  Psych:  She has a normal mood and affect  Skin -  warm and dry, normal color, no suspicious lesions noted  Chest - effort normal, all lung fields clear to auscultation bilaterally  Heart - normal rate and regular rhythm  Neck:  midline trachea, no nodules, enlarged thyroid  Breasts - breasts appear normal, no suspicious masses, no skin or nipple changes or  axillary nodes  Abdomen - soft, nontender, nondistended, no masses or organomegaly  Pelvic - VULVA: normal appearing vulva with no masses, tenderness or lesions  VAGINA: normal appearing vagina with normal color and discharge, no lesions  CERVIX: normal appearing cervix without discharge or lesions, no CMT  Thin prep pap is done w/ HR HPV cotesting  UTERUS: uterus is felt to be normal size, shape, consistency and nontender   ADNEXA: No adnexal masses or tenderness noted.  Extremities:  No swelling or varicosities noted  Chaperone: Angel Neas    No results found for this or any previous visit (from the past 24 hour(s)).  Assessment & Plan:  1) Well-Woman Exam  2) Enlarged thyroid> will check labs, then get u/s if needed  Labs/procedures today: pap, labs  Mammogram: @ 21yo, or sooner if problems Colonoscopy: @ 21yo, or sooner if problems  Orders Placed This Encounter  Procedures  . TSH  . T4, free  . T3    Meds: No orders of the defined types were placed in this encounter.   Follow-up: Return in about 1 year (around 03/29/2022) for Physical.  03/31/2022 CNM,  WHNP-BC 03/29/2021 2:05 PM

## 2021-03-30 DIAGNOSIS — E039 Hypothyroidism, unspecified: Secondary | ICD-10-CM | POA: Insufficient documentation

## 2021-03-30 LAB — T3: T3, Total: 73 ng/dL (ref 71–180)

## 2021-03-30 LAB — T4, FREE: Free T4: 0.33 ng/dL — ABNORMAL LOW (ref 0.82–1.77)

## 2021-03-30 LAB — TSH: TSH: 63.7 u[IU]/mL — ABNORMAL HIGH (ref 0.450–4.500)

## 2021-03-30 MED ORDER — LEVOTHYROXINE SODIUM 25 MCG PO TABS: 25.0000 ug | ORAL_TABLET | Freq: Every day | ORAL | 3 refills | Status: DC

## 2021-03-30 NOTE — Addendum Note (Signed)
Addended by: Cheral Marker on: 03/30/2021 05:15 PM   Modules accepted: Orders

## 2021-03-31 LAB — CYTOLOGY - PAP
Chlamydia: NEGATIVE
Comment: NEGATIVE
Comment: NORMAL
Diagnosis: NEGATIVE
Neisseria Gonorrhea: NEGATIVE

## 2021-04-03 ENCOUNTER — Other Ambulatory Visit: Payer: Self-pay | Admitting: Women's Health

## 2021-04-03 DIAGNOSIS — E039 Hypothyroidism, unspecified: Secondary | ICD-10-CM

## 2021-04-06 ENCOUNTER — Ambulatory Visit (HOSPITAL_COMMUNITY)
Admission: RE | Admit: 2021-04-06 | Discharge: 2021-04-06 | Disposition: A | Discharge status: Home / Self Care | Payer: Medicaid Other | Source: Ambulatory Visit | Attending: Women's Health | Admitting: Women's Health

## 2021-04-06 ENCOUNTER — Other Ambulatory Visit: Payer: Self-pay

## 2021-04-06 DIAGNOSIS — E010 Iodine-deficiency related diffuse (endemic) goiter: Secondary | ICD-10-CM | POA: Insufficient documentation

## 2021-04-06 DIAGNOSIS — E039 Hypothyroidism, unspecified: Secondary | ICD-10-CM | POA: Diagnosis present

## 2021-06-19 ENCOUNTER — Emergency Department (HOSPITAL_COMMUNITY)
Admission: EM | Admit: 2021-06-19 | Discharge: 2021-06-19 | Disposition: A | Discharge status: Home / Self Care | Payer: Medicaid Other | Attending: Emergency Medicine | Admitting: Emergency Medicine

## 2021-06-19 ENCOUNTER — Encounter (HOSPITAL_COMMUNITY): Payer: Self-pay | Admitting: Emergency Medicine

## 2021-06-19 ENCOUNTER — Other Ambulatory Visit: Payer: Self-pay

## 2021-06-19 DIAGNOSIS — Z79899 Other long term (current) drug therapy: Secondary | ICD-10-CM | POA: Insufficient documentation

## 2021-06-19 DIAGNOSIS — R0981 Nasal congestion: Secondary | ICD-10-CM | POA: Insufficient documentation

## 2021-06-19 DIAGNOSIS — E039 Hypothyroidism, unspecified: Secondary | ICD-10-CM | POA: Insufficient documentation

## 2021-06-19 DIAGNOSIS — Z20822 Contact with and (suspected) exposure to covid-19: Secondary | ICD-10-CM | POA: Diagnosis not present

## 2021-06-19 DIAGNOSIS — J029 Acute pharyngitis, unspecified: Secondary | ICD-10-CM | POA: Diagnosis not present

## 2021-06-19 HISTORY — DX: Hypothyroidism, unspecified: E03.9

## 2021-06-19 LAB — GROUP A STREP BY PCR: Group A Strep by PCR: NOT DETECTED

## 2021-06-19 LAB — COMPREHENSIVE METABOLIC PANEL
ALT: 40 U/L (ref 0–44)
AST: 31 U/L (ref 15–41)
Albumin: 4.5 g/dL (ref 3.5–5.0)
Alkaline Phosphatase: 66 U/L (ref 38–126)
Anion gap: 7 (ref 5–15)
BUN: 10 mg/dL (ref 6–20)
CO2: 25 mmol/L (ref 22–32)
Calcium: 8.9 mg/dL (ref 8.9–10.3)
Chloride: 105 mmol/L (ref 98–111)
Creatinine, Ser: 0.8 mg/dL (ref 0.44–1.00)
GFR, Estimated: >60 mL/min (ref >60)
Glucose, Bld: 87 mg/dL (ref 70–99)
Potassium: 3.6 mmol/L (ref 3.5–5.1)
Sodium: 137 mmol/L (ref 135–145)
Total Bilirubin: 1.7 mg/dL — ABNORMAL HIGH (ref 0.3–1.2)
Total Protein: 7.5 g/dL (ref 6.5–8.1)

## 2021-06-19 LAB — CBC WITH DIFFERENTIAL/PLATELET
Abs Immature Granulocytes: 0.02 10*3/uL (ref 0.00–0.07)
Basophils Absolute: 0.1 10*3/uL (ref 0.0–0.1)
Basophils Relative: 1 %
Eosinophils Absolute: 0.1 10*3/uL (ref 0.0–0.5)
Eosinophils Relative: 1 %
HCT: 36.5 % (ref 36.0–46.0)
Hemoglobin: 12.7 g/dL (ref 12.0–15.0)
Immature Granulocytes: 0 %
Lymphocytes Relative: 29 %
Lymphs Abs: 1.5 10*3/uL (ref 0.7–4.0)
MCH: 32.4 pg (ref 26.0–34.0)
MCHC: 34.8 g/dL (ref 30.0–36.0)
MCV: 93.1 fL (ref 80.0–100.0)
Monocytes Absolute: 0.5 10*3/uL (ref 0.1–1.0)
Monocytes Relative: 10 %
Neutro Abs: 3 10*3/uL (ref 1.7–7.7)
Neutrophils Relative %: 59 %
Platelets: 150 10*3/uL (ref 150–400)
RBC: 3.92 MIL/uL (ref 3.87–5.11)
RDW: 13.6 % (ref 11.5–15.5)
WBC: 5.1 10*3/uL (ref 4.0–10.5)
nRBC: 0 % (ref 0.0–0.2)

## 2021-06-19 LAB — TSH: TSH: 100.28 u[IU]/mL — ABNORMAL HIGH (ref 0.350–4.500)

## 2021-06-19 LAB — POC SARS CORONAVIRUS 2 AG -  ED: SARSCOV2ONAVIRUS 2 AG: NEGATIVE

## 2021-06-19 NOTE — Discharge Instructions (Addendum)
Strep test and COVID test were negative today.  It is important that you start taking your thyroid medicine.  Unfortunately today we do not have anyone on call for endocrinology for you to follow-up with.  It is going be very important that you follow-up with your OB/GYN for further evaluation of your thyroid as they might need to make changes to your medication.  You should call their office first thing tomorrow and schedule the next available appointment.   Treat your symptoms with Tylenol and ibuprofen at home.  Make sure you stay well-hydrated.  You can try drinking warm tea to help with your sore throat as well.  Return to the ER if symptoms worsen.

## 2021-06-19 NOTE — ED Provider Notes (Signed)
[facility] EMERGENCY DEPARTMENT Provider Note   CSN: [id number] Arrival date & time: 06/19/21  1058     History Chief Complaint  Patient presents with   Sore Throat    Kathleen Grimes is a 20 y.o. female with past medical history significant for allergies, anemia, hypothyroidism, abdominal migraines.  HPI Patient presents to emergency department today with chief complaint of sore throat x2 days.  Patient states she had a new air conditioning unit installed in her home and noticed that the next day she had a sore throat.  She is also endorsing nasal congestion.  She describes sore throat as aching sensation.  Pain is worse when swallowing. She rates pain 2/10 in severity.  She has not taken any over-the-counter medications for symptoms prior to arrival.  Patient states she was diagnosed with hypothyroidism approximately 2 months ago by OB/GYN and prescribed medication for it.  She states she only took the medicine x1 month and then stopped taking it because she did not think it was a serious problem. She has thyroid medication at home. No sick contacts or known covid exposures. Denies fever, chills, shortness of breath, cough, wheezing, chest pain, rash.   Patient diagnosed with hypothyroidism 03/29/20 and started on synthroid 25 mcg/, she had outpatient thyroid US 04/06/21 that showed diffusely heterogeneous throid without discrete nodule.    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   Elevated LFTs 04/26/2020   Hx of cholecystectomy 04/25/2020   Calculus of gallbladder without cholecystitis without obstruction 04/06/2020   Abdominal migraine 02/22/2015   Cyclical vomiting 02/22/2015   Allergic rhinitis 03/13/2013    Past  Surgical History:  Procedure Laterality Date   CHOLECYSTECTOMY N/A 04/13/2020   Procedure: LAPAROSCOPIC CHOLECYSTECTOMY;  Surgeon: Lucretia Roers, MD;  Location: [facility];  Service: General;  Laterality: N/A;   CHOLECYSTECTOMY     WISDOM TOOTH EXTRACTION       OB History     Gravida  1   Para  1   Term  1   Preterm  0   AB  0   Living  1      SAB  0   IAB  0   Ectopic  0   Multiple  0   Live Births  1           Family History  Problem Relation Age of Onset   Breast cancer Paternal Grandmother    Cancer Maternal Great-grandmother        bone marrow   Colon cancer Neg Hx    Esophageal cancer Neg Hx    Stomach cancer Neg Hx     Social History   Tobacco Use   Smoking status: Never   Smokeless tobacco: Never  Vaping Use   Vaping Use: Never used  Substance Use Topics   Alcohol use: No   Drug use: Not Currently    Types: Marijuana    Comment: 2 grams    Home Medications Prior to Admission medications   Medication Sig Start Date End Date Taking? Authorizing Provider  acetaminophen (TYLENOL) 500 MG tablet Take 500 mg by mouth every 6 (six) hours as needed.    [provider]  levothyroxine (SYNTHROID) 25 MCG tablet Take 1 tablet (25 mcg total) by mouth daily before breakfast. 03/30/21   Cheral Marker, CNM  Prenatal Vit-Fe Fumarate-FA (PRENATAL MULTIVITAMIN) TABS tablet Take 2 tablets by mouth daily at 12 noon. Gummies    [provider]    Allergies    Hydrocodone and Hydrocodone  Review of Systems   Review of Systems All other systems are reviewed and are negative for acute change except as noted in the HPI.  Physical Exam Updated Vital Signs BP 104/75 (BP Location: Right Arm)   Pulse 80   Temp 99.7 F (37.6 C)   Resp 15   Ht 5\' 7"  (1.702 m)   Wt 56.7 kg   SpO2 98%   BMI 19.58 kg/m   Physical Exam Vitals and nursing note reviewed.  Constitutional:      General: She is not in acute distress.    Appearance:  She is not ill-appearing.  HENT:     Head: Normocephalic and atraumatic.     Comments: Minor erythema to oropharynx, no edema, no exudate, no tonsillar swelling, voice normal, neck supple without lymphadenopathy    Right Ear: Tympanic membrane and external ear normal.     Left Ear: Tympanic membrane and external ear normal.     Nose: Nose normal.     Mouth/Throat:     Mouth: Mucous membranes are moist.     Pharynx: Oropharynx is clear.  Eyes:     General: No scleral icterus.       Right eye: No discharge.        Left eye: No discharge.     Extraocular Movements: Extraocular movements intact.     Conjunctiva/sclera: Conjunctivae normal.     Pupils: Pupils are equal, round, and reactive to light.  Neck:     Thyroid: No thyroid mass or thyroid tenderness.     Vascular: No JVD.     Comments: Mildly enlarged thyroid Cardiovascular:     Rate and Rhythm: Normal rate and regular rhythm.     Pulses: Normal pulses.          Radial pulses are 2+ on the right side and 2+ on the left side.     Heart sounds: Normal heart sounds.  Pulmonary:     Comments: Lungs clear to auscultation in all fields. Symmetric chest rise. No wheezing, rales, or rhonchi. Abdominal:     Comments: Abdomen is soft, non-distended, and non-tender in all quadrants. No rigidity, no guarding. No peritoneal signs.  Musculoskeletal:        General: Normal range of motion.     Cervical back: Normal range of motion.  Skin:    General: Skin is warm and dry.     Capillary Refill: Capillary refill takes less than 2 seconds.  Neurological:     Mental Status: She is oriented to person, place, and time.     GCS: GCS eye subscore is 4. GCS verbal subscore is 5. GCS motor subscore is 6.     Comments: Fluent speech, no facial droop.  Psychiatric:        Behavior: Behavior normal.    ED Results / Procedures / Treatments   Labs (all labs ordered are listed, but only abnormal results are displayed) Labs Reviewed  TSH -  Abnormal; Notable for the following components:      Result Value   TSH 100.280 (*)    All other components within normal limits  COMPREHENSIVE METABOLIC PANEL - Abnormal; Notable  for the following components:   Total Bilirubin 1.7 (*)    All other components within normal limits  GROUP A STREP BY PCR  CBC WITH DIFFERENTIAL/PLATELET  POC SARS CORONAVIRUS 2 AG -  ED    EKG None  Radiology No results found.  Procedures Procedures   Medications Ordered in ED Medications - No data to display  ED Course  I have reviewed the triage vital signs and the nursing notes.  Pertinent labs & imaging results that were available during my care of the patient were reviewed by me and considered in my medical decision making (see chart for details).    MDM Rules/Calculators/A&P                          History provided by patient with additional history obtained from chart review.    Patient presenting with sore throat.  She is afebrile.  She was noted to be tachycardic in triage to 106.  Vital signs rechecked and tachycardia had resolved.  Patient has history of hypothyroidism and has not been compliant with medications over the last x1 month.  Exam with nontender mildly enlarged thyroid.  No nodules felt.  No signs of strep or peritonsillar abscess on exam.  No Ludwig's angina.  Lungs are clear to auscultation all fields and does not work of breathing.  TSH was ordered in triage. It is elevated at TSH 100.2. Patient does not have signs of myxedema coma on exam. Basic labs checked and are unremarkable.  No significant electrolyte derangement.  COVID test is negative.  Updated patient on results.  She is tolerating p.o. intake here.  She is stable to be discharged home.  She knows the importance of following up with OB/GYN regarding her hypothyroidism, no endocrinologist on call today to give her information for follow-up unfortunately.  Patient plans to restart Synthroid which she already has at  home.  Strict return precautions were discussed. Patient agreeable with plan of care.  Findings and plan of care discussed with supervising physician Dr. Rhunette Croft who agrees with plan of care.  Portions of this note were generated with Scientist, clinical (histocompatibility and immunogenetics). Dictation errors may occur despite best attempts at proofreading.  Final Clinical Impression(s) / ED Diagnoses Final diagnoses:  Sore throat    Rx / DC Orders ED Discharge Orders     None        Shanon Ace, PA-C 06/19/21 1526    Derwood Kaplan, MD 06/20/21 [phone]

## 2021-06-19 NOTE — ED Triage Notes (Signed)
Patient c/o sore throat onset 2 days ago with nasal congestion. Reports body aches and weakness. States she has recently been diagnosed with hypothyroidism and not taking medication correctly.

## 2021-06-19 NOTE — ED Notes (Signed)
RN reviewed discharge instructions. Follow up and pain management reviewed, pt had no further questions

## 2021-07-05 ENCOUNTER — Other Ambulatory Visit: Payer: Self-pay

## 2021-07-05 ENCOUNTER — Ambulatory Visit: Payer: Medicaid Other | Admitting: Women's Health

## 2021-07-05 ENCOUNTER — Encounter: Payer: Self-pay | Admitting: Women's Health

## 2021-07-05 VITALS — BP 103/64 | HR 73 | Wt 149.3 lb

## 2021-07-05 DIAGNOSIS — E039 Hypothyroidism, unspecified: Secondary | ICD-10-CM | POA: Diagnosis not present

## 2021-07-05 DIAGNOSIS — E010 Iodine-deficiency related diffuse (endemic) goiter: Secondary | ICD-10-CM | POA: Diagnosis not present

## 2021-07-05 NOTE — Progress Notes (Signed)
GYN VISIT Patient name: Kathleen Grimes MRN [id number]  Date of birth: 2001/07/20 Chief Complaint:   Follow-up (Thyroid)  History of Present Illness:   Kathleen Grimes is a 20 y.o. G56P1001 Caucasian female being seen today for f/u on thyroid. I saw her 4/13 for pap & physical, noted thyromegaly, got TSH which was 63, started her on synthroid and got thyroid u/s which was normal (report below). Was to f/u in 6wks for repeat TSH, but did not. Went to ED 7/4 w/ sore throat. TSH 100.28, reports she had stopped her synthroid for a couple of weeks b/c was forgetting to take. Has since restarted. Denies any sx.  Thyroid u/s 04/06/21:  CLINICAL DATA:  Thyromegaly and hypothyroidism   EXAM: THYROID ULTRASOUND   TECHNIQUE: Ultrasound examination of the thyroid gland and adjacent soft tissues was performed.   COMPARISON:  04/07/2018   FINDINGS: Parenchymal Echotexture: Moderately heterogeneous   Isthmus: 0.6 cm   Right lobe: 5.2 x 2.1 x 2.3 cm   Left lobe: 5.1 x 1.8 x 1.9 cm   _________________________________________________________   Estimated total number of nodules >/= 1 cm: 0   Number of spongiform nodules >/=  2 cm not described below (TR1): 0   Number of mixed cystic and solid nodules >/= 1.5 cm not described below (TR2): 0   _________________________________________________________   No discrete nodules are seen within the thyroid gland.   IMPRESSION: Diffusely heterogeneous thyroid without discrete nodule.   The above is in keeping with the ACR TI-RADS recommendations - J Am Coll Radiol 2017;14:587-595.     Electronically Signed   By: Acquanetta Belling M.D.   On: 04/06/2021 16:57 No LMP recorded. The current method of family planning is abstinence.  Last pap 03/29/21. Results were: NILM w/ HRHPV not done  Depression screen [facility] 2/9 03/29/2021 04/25/2020  Decreased Interest 0 3  Down, Depressed, Hopeless 0 0  PHQ - 2 Score 0 3  Altered sleeping 0 0  Tired,  decreased energy 0 1  Change in appetite 0 2  Feeling bad or failure about yourself  0 0  Trouble concentrating 0 0  Moving slowly or fidgety/restless 0 1  Suicidal thoughts 0 0  PHQ-9 Score 0 7     GAD 7 : Generalized Anxiety Score 03/29/2021 04/25/2020  Nervous, Anxious, on Edge 1 1  Control/stop worrying 0 0  Worry too much - different things 1 0  Trouble relaxing 0 1  Restless 0 0  Easily annoyed or irritable 0 0  Afraid - awful might happen 0 1  Total GAD 7 Score 2 3     Review of Systems:   Pertinent items are noted in HPI Denies fever/chills, dizziness, headaches, visual disturbances, fatigue, shortness of breath, chest pain, abdominal pain, vomiting, abnormal vaginal discharge/itching/odor/irritation, problems with periods, bowel movements, urination, or intercourse unless otherwise stated above.  Pertinent History Reviewed:  Reviewed past medical,surgical, social, obstetrical and family history.  Reviewed problem list, medications and allergies. Physical Assessment:   Vitals:   07/05/21 1554  BP: 103/64  Pulse: 73  Weight: 149 lb 4.8 oz (67.7 kg)  Body mass index is 23.38 kg/m.       Physical Examination:   General appearance: alert, well appearing, and in no distress  Mental status: alert, oriented to person, place, and time  Skin: warm & dry   Cardiovascular: normal heart rate noted  Respiratory: normal respiratory effort, no distress  Neck: thyromegaly  Abdomen: soft, non-tender  Pelvic: examination not indicated  Extremities: no edema   Chaperone: N/A    No results found for this or any previous visit (from the past 24 hour(s)).  Assessment & Plan:  1) Thyromegaly w/ worsening hypothyroidism> normal thyroid u/s, increase synthroid to , referral to endocrinology ordered and note routed to [facility]  Meds: No orders of the defined types were placed in this encounter.   Orders Placed This Encounter  Procedures   Ambulatory referral to Endocrinology     Return in about 1 year (around 07/05/2022) for Physical.  Cheral Marker CNM, [facility] 07/05/2021 4:13 PM

## 2021-07-05 NOTE — Patient Instructions (Signed)
Increase synthroid to , we will send a referral to the endocrinologist

## 2021-07-17 ENCOUNTER — Other Ambulatory Visit: Payer: Self-pay

## 2021-07-17 MED ORDER — LEVOTHYROXINE SODIUM 50 MCG PO TABS: 50.0000 ug | ORAL_TABLET | Freq: Every day | ORAL | 0 refills | Status: DC

## 2021-07-26 NOTE — Patient Instructions (Signed)

## 2021-07-27 ENCOUNTER — Ambulatory Visit (INDEPENDENT_AMBULATORY_CARE_PROVIDER_SITE_OTHER): Payer: Medicaid Other | Admitting: Nurse Practitioner

## 2021-07-27 ENCOUNTER — Other Ambulatory Visit: Payer: Self-pay

## 2021-07-27 ENCOUNTER — Encounter: Payer: Self-pay | Admitting: Nurse Practitioner

## 2021-07-27 VITALS — BP 94/62 | HR 71 | Ht 67.0 in | Wt 146.0 lb

## 2021-07-27 DIAGNOSIS — E039 Hypothyroidism, unspecified: Secondary | ICD-10-CM | POA: Diagnosis not present

## 2021-07-27 NOTE — Progress Notes (Signed)
Endocrinology Consult Note                                         07/27/2021, 3:08 PM  Subjective:   Subjective    Kathleen Grimes is a 20 y.o.-year-old female patient being seen in consultation for hypothyroidism referred by Royce Macadamia D., PA-C.   Past Medical History:  Diagnosis Date   Abdominal migraine    Allergic rhinitis 03/13/2013   Allergy    Anemia    Hypothyroidism     Past Surgical History:  Procedure Laterality Date   CHOLECYSTECTOMY N/A 04/13/2020   Procedure: LAPAROSCOPIC CHOLECYSTECTOMY;  Surgeon: Virl Cagey, MD;  Location: [facility];  Service: General;  Laterality: N/A;   CHOLECYSTECTOMY     CHOLECYSTECTOMY     WISDOM TOOTH EXTRACTION      Social History   Socioeconomic History   Marital status: Single    Spouse name: Not on file   Number of children: 1   Years of education: Not on file   Highest education level: Not on file  Occupational History   Not on file  Tobacco Use   Smoking status: Never   Smokeless tobacco: Never  Vaping Use   Vaping Use: Never used  Substance and Sexual Activity   Alcohol use: Yes    Comment: 1-2 drinks a month   Drug use: Not Currently    Types: Marijuana    Comment: 2 grams   Sexual activity: Not Currently    Birth control/protection: None  Other Topics Concern   Not on file  Social History Narrative   ** Merged History Encounter **       Social Determinants of Health   Financial Resource Strain: Medium Risk   Difficulty of Paying Living Expenses: Somewhat hard  Food Insecurity: No Food Insecurity   Worried About Charity fundraiser in the Last Year: Never true   Ran Out of Food in the Last Year: Never true  Transportation Needs: No Transportation Needs   Lack of Transportation (Medical): No   Lack of Transportation (Non-Medical): No  Physical Activity: Sufficiently Active   Days of Exercise per Week: 4 days   Minutes of  Exercise per Session: 40 min  Stress: No Stress Concern Present   Feeling of Stress : Only a little  Social Connections: Socially Isolated   Frequency of Communication with Friends and Family: More than three times a week   Frequency of Social Gatherings with Friends and Family: Twice a week   Attends Religious Services: Never   Marine scientist or Organizations: No   Attends Music therapist: Never   Marital Status: Never married    Family History  Problem Relation Age of Onset   Cancer Father    Hypertension Father    Hyperlipidemia Father    Heart attack Father    Breast cancer Paternal Grandmother    Cancer Maternal Great-grandmother        bone marrow  Colon cancer Neg Hx    Esophageal cancer Neg Hx    Stomach cancer Neg Hx     Outpatient Encounter Medications as of 07/27/2021  Medication Sig   levothyroxine (SYNTHROID) 50 MCG tablet Take 1 tablet (50 mcg total) by mouth daily before breakfast.   [DISCONTINUED] acetaminophen (TYLENOL) 500 MG tablet Take 500 mg by mouth every 6 (six) hours as needed. (Patient not taking: Reported on 07/05/2021)   [DISCONTINUED] Prenatal Vit-Fe Fumarate-FA (PRENATAL MULTIVITAMIN) TABS tablet Take 2 tablets by mouth daily at 12 noon. Gummies   No facility-administered encounter medications on file as of 07/27/2021.    ALLERGIES: Allergies  Allergen Reactions   Hydrocodone Nausea Only   Hydrocodone Nausea And Vomiting   VACCINATION STATUS: Immunization History  Administered Date(s) Administered   DTaP 03/06/2000, 05/07/2000, 07/05/2000, 08/06/2001, 06/06/2005   Hepatitis B 2000-06-04, 02/09/2000, 10/15/2000   HiB (PRP-OMP) 03/06/2000, 05/07/2000, 07/05/2000, 08/06/2001   IPV 03/06/2000, 05/07/2000, 10/15/2000, 06/06/2005   Influenza Nasal 11/25/2007, 01/19/2008, 10/08/2008   Influenza Whole 10/11/2009   MMR 01/06/2001, 06/06/2005   Pneumococcal Conjugate-13 03/06/2000, 05/07/2000, 07/05/2000, 01/22/2003   Td  08/09/2011   Tdap 08/09/2011, 08/08/2020   Varicella 01/06/2001     HPI   Kathleen Grimes  is a patient with the above medical history. she was diagnosed with hypothyroidism at approximate age of 77 years while at her 6 week postpartum OBGYN visit, which required subsequent initiation of thyroid hormone replacement. she was given various doses of Levothyroxine since, had brief period of time were she did not take it at all, currently on 50 micrograms (increased by her PCP recently on 07/17/21). she reports compliance to this medication:  Taking it daily on empty stomach  with water, separated by >30 minutes before breakfast and other medications, and by at least 4 hours from calcium, iron, PPIs, multivitamins .  I reviewed patient's thyroid tests:  Lab Results  Component Value Date   TSH 100.280 (H) 06/19/2021   TSH 63.700 (H) 03/29/2021   FREET4 0.33 (L) 03/29/2021     Pt describes: - weight gain - fatigue - cold intolerance - depression - constipation - dry skin - hair loss  Pt denies feeling nodules in neck, hoarseness, dysphagia/odynophagia, SOB with lying down.  she denies family history of thyroid disorders.  No family history of thyroid cancer.  No history of radiation therapy to head or neck.  No recent use of iodine supplements.  Denies use of Biotin containing supplements.    ROS:  Constitutional: no weight gain/loss, + fatigue, no subjective hyperthermia, no subjective hypothermia Eyes: no blurry vision, no xerophthalmia ENT: no sore throat, no nodules palpated in throat, no dysphagia/odynophagia, no hoarseness Cardiovascular: rare mild chest pain on inspiration- resolves without intervention, no SOB, no palpitations, no leg swelling Respiratory: no cough, no SOB Gastrointestinal: no nausea/vomiting/diarrhea Musculoskeletal: no muscle/joint aches Skin: no rashes, reports bruises easily Neurological: no tremors, no numbness, no tingling, no dizziness Psychiatric:  no depression, no anxiety   Objective:   Objective     BP 94/62   Pulse 71   Ht $R'5\' 7"'cn$  (1.702 m)   Wt 146 lb (66.2 kg)   Breastfeeding Yes   BMI 22.87 kg/m  Wt Readings from Last 3 Encounters:  07/27/21 146 lb (66.2 kg)  07/05/21 149 lb 4.8 oz (67.7 kg)  06/19/21 125 lb (56.7 kg)    BP Readings from Last 3 Encounters:  07/27/21 94/62  07/05/21 103/64  06/19/21 104/75     Constitutional:  Body mass index is 22.87 kg/m., not in acute distress, normal state of mind Eyes: PERRLA, EOMI, no exophthalmos ENT: moist mucous membranes, gross thyromegaly, no cervical lymphadenopathy Cardiovascular: normal precordial activity, RRR, no murmur/rubs/gallops Respiratory:  adequate breathing efforts, no gross chest deformity, Clear to auscultation bilaterally Gastrointestinal: abdomen soft, non-tender, no distension, bowel sounds present Musculoskeletal: no gross deformities, strength intact in all four extremities Skin: moist, warm, no rashes Neurological: no tremor with outstretched hands, deep tendon reflexes normal in BLE.   CMP ( most recent) CMP     Component Value Date/Time   NA 137 06/19/2021 1332   NA 139 08/29/2020 1111   K 3.6 06/19/2021 1332   CL 105 06/19/2021 1332   CO2 25 06/19/2021 1332   GLUCOSE 87 06/19/2021 1332   BUN 10 06/19/2021 1332   BUN 5 (L) 08/29/2020 1111   CREATININE 0.80 06/19/2021 1332   CREATININE 0.66 08/02/2017 1458   CALCIUM 8.9 06/19/2021 1332   PROT 7.5 06/19/2021 1332   PROT 6.4 08/29/2020 1111   ALBUMIN 4.5 06/19/2021 1332   ALBUMIN 3.8 (L) 08/29/2020 1111   AST 31 06/19/2021 1332   ALT 40 06/19/2021 1332   ALKPHOS 66 06/19/2021 1332   BILITOT 1.7 (H) 06/19/2021 1332   BILITOT 0.3 08/29/2020 1111   GFRNONAA >60 06/19/2021 1332   GFRNONAA SEE NOTE 08/02/2017 1458   GFRAA 155 08/29/2020 1111   GFRAA SEE NOTE 08/02/2017 1458     Diabetic Labs (most recent): No results found for: HGBA1C   Lipid Panel ( most recent) Lipid Panel   No results found for: CHOL, TRIG, HDL, CHOLHDL, VLDL, LDLCALC, LDLDIRECT, LABVLDL     Lab Results  Component Value Date   TSH 100.280 (H) 06/19/2021   TSH 63.700 (H) 03/29/2021   FREET4 0.33 (L) 03/29/2021    Thyroid US from 04/06/21  CLINICAL DATA:  Thyromegaly and hypothyroidism   EXAM: THYROID ULTRASOUND   TECHNIQUE: Ultrasound examination of the thyroid gland and adjacent soft tissues was performed.   COMPARISON:  04/07/2018   FINDINGS: Parenchymal Echotexture: Moderately heterogeneous   Isthmus: 0.6 cm   Right lobe: 5.2 x 2.1 x 2.3 cm   Left lobe: 5.1 x 1.8 x 1.9 cm   _________________________________________________________   Estimated total number of nodules >/= 1 cm: 0   Number of spongiform nodules >/=  2 cm not described below (TR1): 0   Number of mixed cystic and solid nodules >/= 1.5 cm not described below (TR2): 0   _________________________________________________________   No discrete nodules are seen within the thyroid gland.   IMPRESSION: Diffusely heterogeneous thyroid without discrete nodule.   The above is in keeping with the ACR TI-RADS recommendations - J Am Coll Radiol 2017;14:587-595.     Electronically Signed   By: Miachel Roux M.D.   On: 04/06/2021 16:57  Assessment & Plan:   ASSESSMENT / PLAN:  1. Hypothyroidism-unspecified   Patient with relatively new hypothyroidism, on levothyroxine therapy. On physical exam, patient  does have gross goiter, no thyroid nodules, or neck compression symptoms.  She is advised to continue Levothyroxine 50 mcg po daily before breakfast.  - We discussed about correct intake of levothyroxine, at fasting, with water, separated by at least 30 minutes from breakfast, and separated by more than 4 hours from calcium, iron, multivitamins, acid reflux medications (PPIs). -Patient is made aware of the fact that thyroid hormone replacement is needed for life, dose to be adjusted by periodic monitoring of  thyroid function  tests.  - Will check thyroid tests before next visit: TSH, free T4, and antibody testing for autoimmune thyroid dysfunction.    - Time spent with the patient: 45 minutes, of which >50% was spent in obtaining information about her symptoms, reviewing her previous labs, evaluations, and treatments, counseling her about her hypothyroidism, and developing a plan to confirm the diagnosis and long term treatment as necessary. Please refer to "Patient Self Inventory" in the Media tab for reviewed elements of pertinent patient history.  Dianah Field Cooler participated in the discussions, expressed understanding, and voiced agreement with the above plans.  All questions were answered to her satisfaction. she is encouraged to contact clinic should she have any questions or concerns prior to her return visit.   FOLLOW UP PLAN:  Return in about 5 weeks (around 08/31/2021) for Thyroid follow up, Previsit labs.  Rayetta Pigg, [facility] [address] [place], [place] [address] Phone: [phone] Fax: [phone]  07/27/2021, 3:08 PM

## 2021-08-24 IMAGING — US US THYROID
1 series · 14 of 25 positions shown · non-contrast
Comparison: 04/07/2018

CLINICAL DATA: Thyromegaly and hypothyroidism

EXAM:
THYROID ULTRASOUND
TECHNIQUE: Ultrasound examination of the thyroid gland and adjacent soft
tissues was performed.

[Series 1: us thyroid · 14 of 53 slices shown]
[im 1/53]
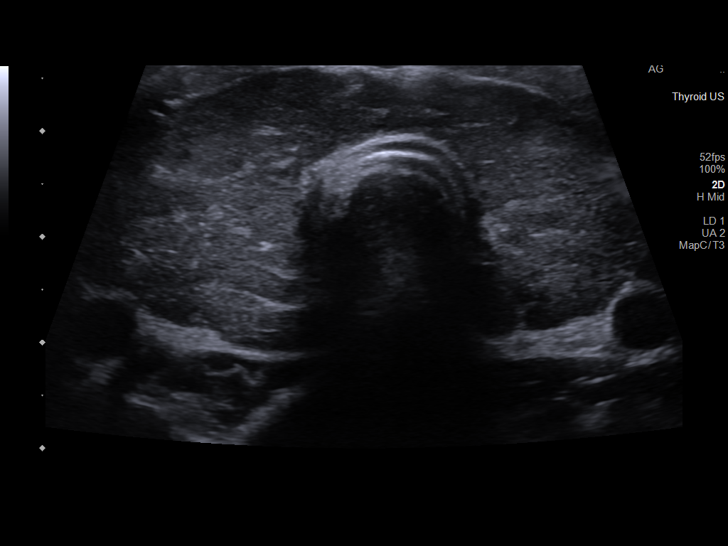
[im 5/53]
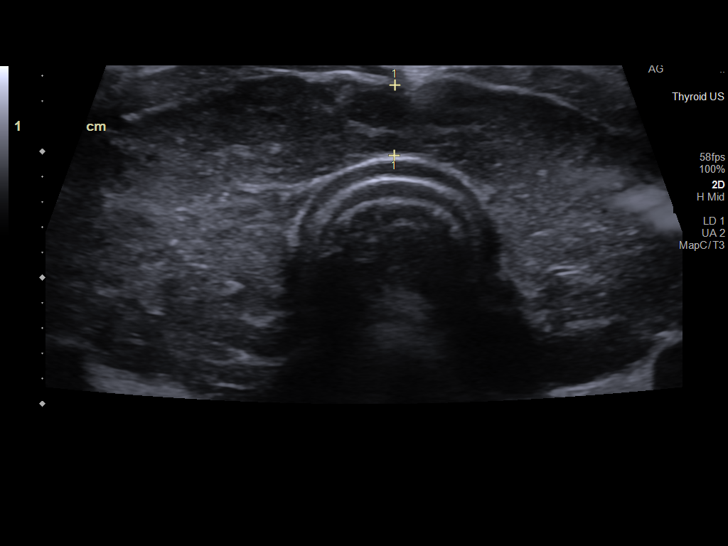
[im 9/53]
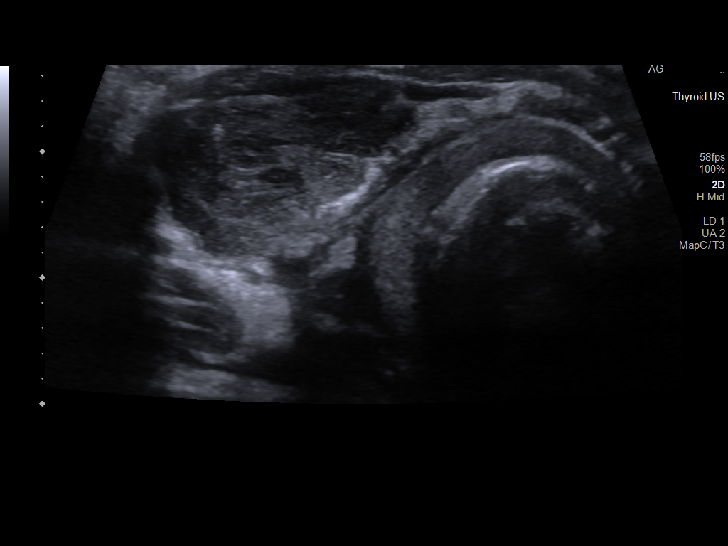
[im 14/53]
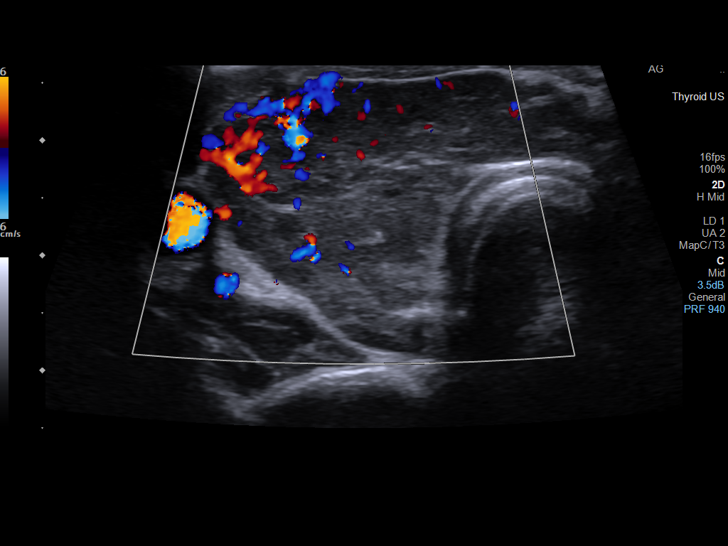
[im 18/53]
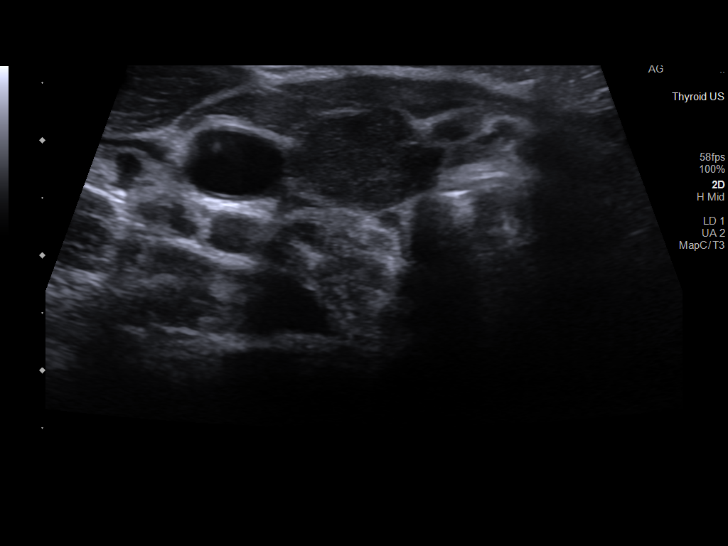
[im 20/53]
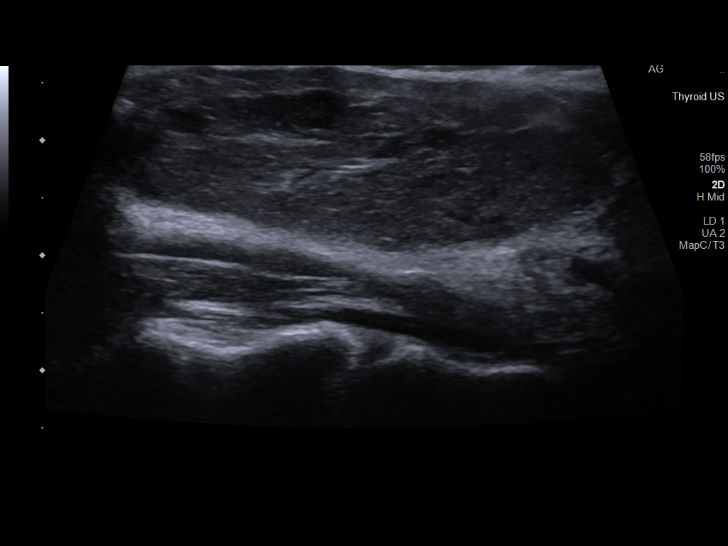
[im 24/53]
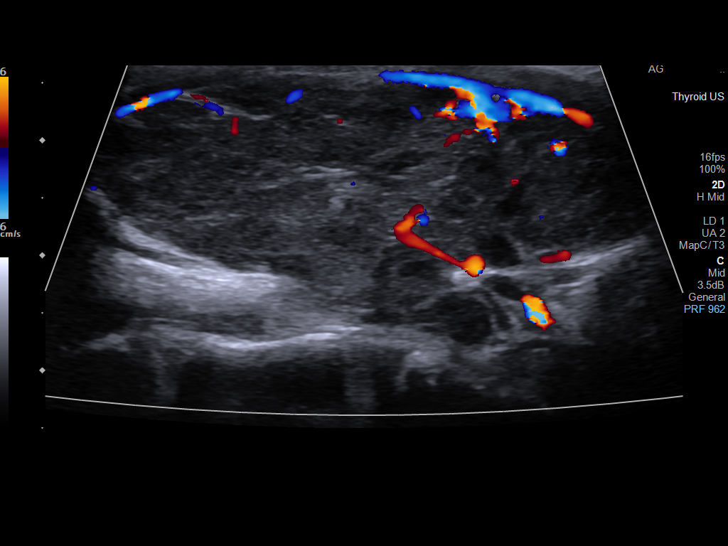
[im 29/53]
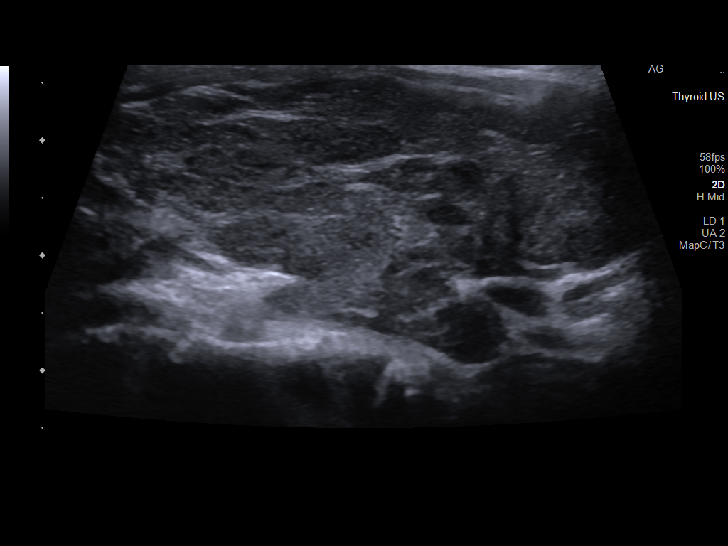
[im 33/53]
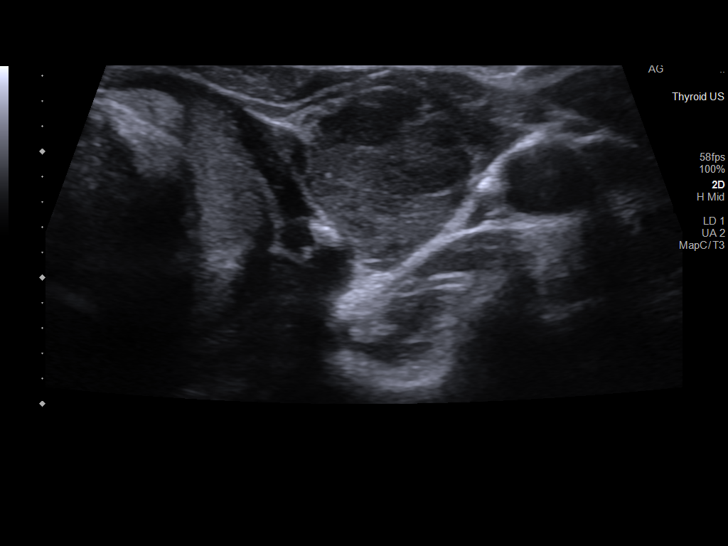
[im 35/53]
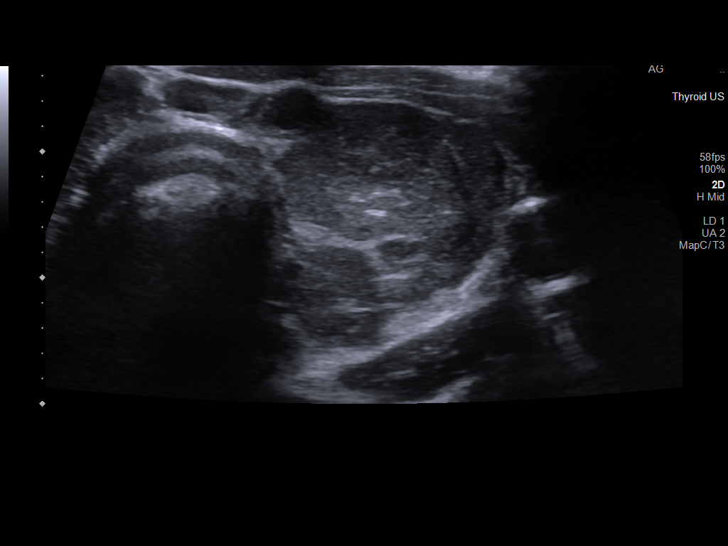
[im 40/53]
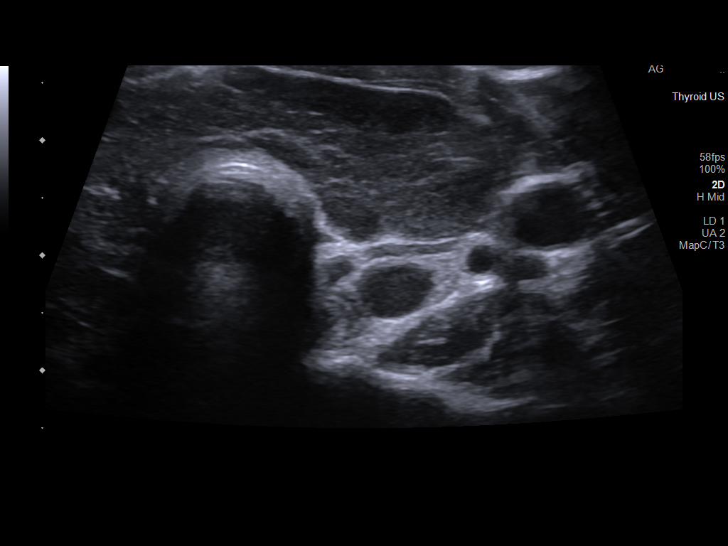
[im 44/53]
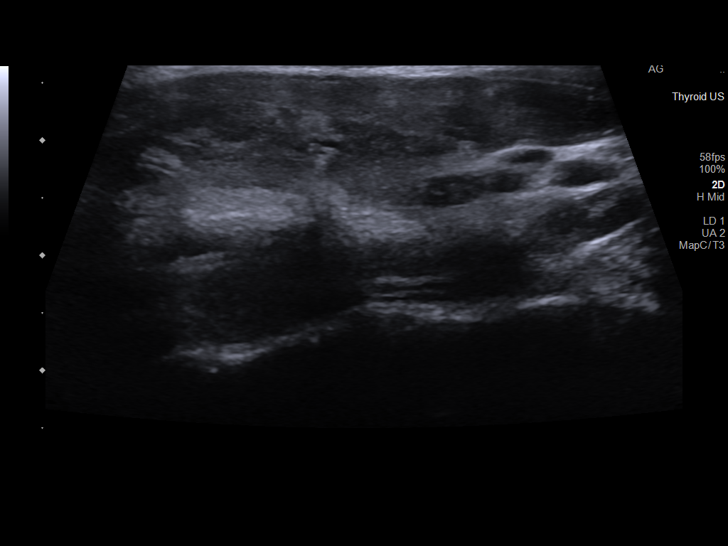
[im 48/53]
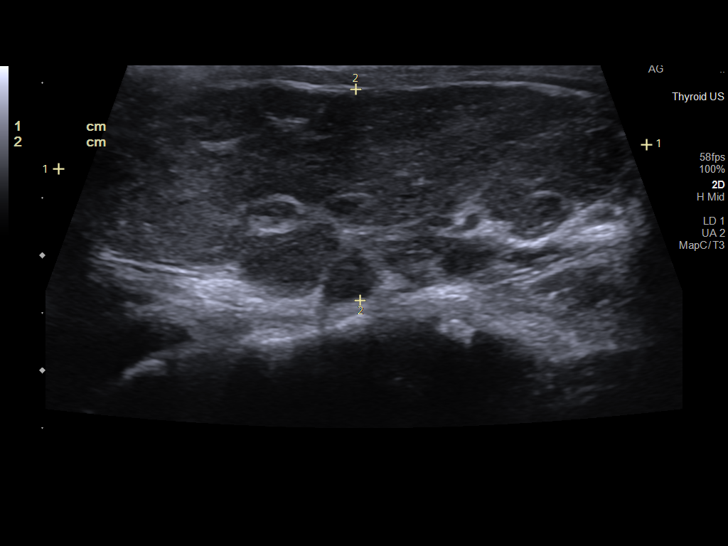
[im 53/53]
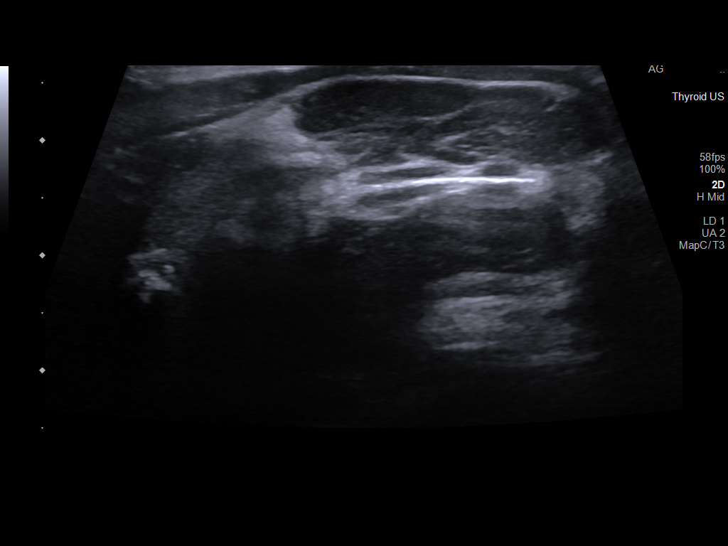

[14 of 25 positions shown; findings below may reference images not displayed]

FINDINGS: Parenchymal Echotexture: Moderately heterogeneous

Isthmus: 0.6 cm

Right lobe: 5.2 x 2.1 x 2.3 cm

Left lobe: 5.1 x 1.8 x 1.9 cm

_________________________________________________________

Estimated total number of nodules >/= 1 cm: 0

Number of spongiform nodules >/=  2 cm not described below (TR1): 0

Number of mixed cystic and solid nodules >/= 1.5 cm not described
below (TR2): 0

_________________________________________________________

No discrete nodules are seen within the thyroid gland.
IMPRESSION: Diffusely heterogeneous thyroid without discrete nodule.

The above is in keeping with the ACR TI-RADS recommendations - [HOSPITAL] 7391;[DATE].

## 2021-08-25 LAB — THYROGLOBULIN ANTIBODY: Thyroglobulin Antibody: <1 IU/mL (ref 0.0–0.9)

## 2021-08-25 LAB — T4, FREE: Free T4: 1.22 ng/dL (ref 0.82–1.77)

## 2021-08-25 LAB — THYROID PEROXIDASE ANTIBODY: Thyroperoxidase Ab SerPl-aCnc: 104 IU/mL — ABNORMAL HIGH (ref 0–34)

## 2021-08-25 LAB — TSH: TSH: 8.5 u[IU]/mL — ABNORMAL HIGH (ref 0.450–4.500)

## 2021-08-31 ENCOUNTER — Ambulatory Visit (INDEPENDENT_AMBULATORY_CARE_PROVIDER_SITE_OTHER): Payer: Medicaid Other | Admitting: Nurse Practitioner

## 2021-08-31 ENCOUNTER — Encounter: Payer: Self-pay | Admitting: Nurse Practitioner

## 2021-08-31 ENCOUNTER — Other Ambulatory Visit: Payer: Self-pay

## 2021-08-31 VITALS — BP 98/60 | HR 98 | Ht 67.0 in | Wt 146.2 lb

## 2021-08-31 DIAGNOSIS — E038 Other specified hypothyroidism: Secondary | ICD-10-CM | POA: Diagnosis not present

## 2021-08-31 DIAGNOSIS — E063 Autoimmune thyroiditis: Secondary | ICD-10-CM | POA: Diagnosis not present

## 2021-08-31 MED ORDER — LEVOTHYROXINE SODIUM 75 MCG PO TABS
75.0000 ug | ORAL_TABLET | Freq: Every day | ORAL | 1 refills | Status: DC
Start: 2021-08-31 — End: 2021-11-30

## 2021-08-31 NOTE — Patient Instructions (Signed)

## 2021-08-31 NOTE — Progress Notes (Signed)
Endocrinology Follow Up Note                                         08/31/2021, 2:49 PM  Subjective:   Subjective    Kathleen Grimes is a 20 y.o.-year-old female patient being seen in follow up after being seen in consultation for hypothyroidism referred by Royce Macadamia D., PA-C.   Past Medical History:  Diagnosis Date   Abdominal migraine    Allergic rhinitis 03/13/2013   Allergy    Anemia    Hypothyroidism     Past Surgical History:  Procedure Laterality Date   CHOLECYSTECTOMY N/A 04/13/2020   Procedure: LAPAROSCOPIC CHOLECYSTECTOMY;  Surgeon: Virl Cagey, MD;  Location: [facility];  Service: General;  Laterality: N/A;   CHOLECYSTECTOMY     CHOLECYSTECTOMY     WISDOM TOOTH EXTRACTION      Social History   Socioeconomic History   Marital status: Single    Spouse name: Not on file   Number of children: 1   Years of education: Not on file   Highest education level: Not on file  Occupational History   Not on file  Tobacco Use   Smoking status: Never   Smokeless tobacco: Never  Vaping Use   Vaping Use: Never used  Substance and Sexual Activity   Alcohol use: Yes    Comment: 1-2 drinks a month   Drug use: Not Currently    Types: Marijuana    Comment: 2 grams   Sexual activity: Not Currently    Birth control/protection: None  Other Topics Concern   Not on file  Social History Narrative   ** Merged History Encounter **       Social Determinants of Health   Financial Resource Strain: Medium Risk   Difficulty of Paying Living Expenses: Somewhat hard  Food Insecurity: No Food Insecurity   Worried About Charity fundraiser in the Last Year: Never true   Ran Out of Food in the Last Year: Never true  Transportation Needs: No Transportation Needs   Lack of Transportation (Medical): No   Lack of Transportation (Non-Medical): No  Physical Activity: Sufficiently Active   Days of Exercise per  Week: 4 days   Minutes of Exercise per Session: 40 min  Stress: No Stress Concern Present   Feeling of Stress : Only a little  Social Connections: Socially Isolated   Frequency of Communication with Friends and Family: More than three times a week   Frequency of Social Gatherings with Friends and Family: Twice a week   Attends Religious Services: Never   Marine scientist or Organizations: No   Attends Music therapist: Never   Marital Status: Never married    Family History  Problem Relation Age of Onset   Cancer Father    Hypertension Father    Hyperlipidemia Father    Heart attack Father    Breast cancer Paternal Grandmother    Cancer Maternal Great-grandmother  bone marrow   Colon cancer Neg Hx    Esophageal cancer Neg Hx    Stomach cancer Neg Hx     Outpatient Encounter Medications as of 08/31/2021  Medication Sig   [DISCONTINUED] levothyroxine (SYNTHROID) 50 MCG tablet Take 1 tablet (50 mcg total) by mouth daily before breakfast.   levothyroxine (SYNTHROID) 75 MCG tablet Take 1 tablet (75 mcg total) by mouth daily before breakfast.   No facility-administered encounter medications on file as of 08/31/2021.    ALLERGIES: Allergies  Allergen Reactions   Hydrocodone Nausea Only   Hydrocodone Nausea And Vomiting   VACCINATION STATUS: Immunization History  Administered Date(s) Administered   DTaP 03/06/2000, 05/07/2000, 07/05/2000, 08/06/2001, 06/06/2005   Hepatitis B [birth year] 29, 1999, 02/09/2000, 10/15/2000   HiB (PRP-OMP) 03/06/2000, 05/07/2000, 07/05/2000, 08/06/2001   IPV 03/06/2000, 05/07/2000, 10/15/2000, 06/06/2005   Influenza Nasal 11/25/2007, 01/19/2008, 10/08/2008   Influenza Whole 10/11/2009   MMR 01/06/2001, 06/06/2005   Pneumococcal Conjugate-13 03/06/2000, 05/07/2000, 07/05/2000, 01/22/2003   Td 08/09/2011   Tdap 08/09/2011, 08/08/2020   Varicella 01/06/2001     HPI   Kathleen Grimes  is a patient with the above medical history.  she was diagnosed with hypothyroidism at approximate age of 64 years while at her 6 week postpartum OBGYN visit, which required subsequent initiation of thyroid hormone replacement. she was given various doses of Levothyroxine since, had brief period of time were she did not take it at all, currently on 50 micrograms (increased by her PCP recently on 07/17/21). she reports compliance to this medication:  Taking it daily on empty stomach  with water, separated by >30 minutes before breakfast and other medications, and by at least 4 hours from calcium, iron, PPIs, multivitamins .  I reviewed patient's thyroid tests:  Lab Results  Component Value Date   TSH 8.500 (H) 08/24/2021   TSH 100.280 (H) 06/19/2021   TSH 63.700 (H) 03/29/2021   FREET4 1.22 08/24/2021   FREET4 0.33 (L) 03/29/2021     Pt describes: - weight gain - fatigue - cold intolerance - depression - constipation - dry skin - hair loss  Pt denies feeling nodules in neck, hoarseness, dysphagia/odynophagia, SOB with lying down.  she denies family history of thyroid disorders.  No family history of thyroid cancer.  No history of radiation therapy to head or neck.  No recent use of iodine supplements.  Denies use of Biotin containing supplements.    ROS:  Constitutional: no weight gain/loss, + fatigue, no subjective hyperthermia, no subjective hypothermia Eyes: no blurry vision, no xerophthalmia ENT: no sore throat, no nodules palpated in throat, no dysphagia/odynophagia, no hoarseness Cardiovascular: no CP, no SOB, no palpitations, no leg swelling Respiratory: no cough, no SOB Gastrointestinal: no nausea/vomiting/diarrhea Musculoskeletal: no muscle/joint aches Skin: no rashes Neurological: no tremors, no numbness, no tingling, no dizziness Psychiatric: no depression, no anxiety   Objective:   Objective     BP 98/60   Pulse 98   Ht '5\' 7"'  (1.702 m)   Wt 146 lb 3.2 oz (66.3 kg)   BMI 22.90 kg/m  Wt Readings from  Last 3 Encounters:  08/31/21 146 lb 3.2 oz (66.3 kg)  07/27/21 146 lb (66.2 kg)  07/05/21 149 lb 4.8 oz (67.7 kg)    BP Readings from Last 3 Encounters:  08/31/21 98/60  07/27/21 94/62  07/05/21 103/64      Physical Exam- Limited  Constitutional:  Body mass index is 22.9 kg/m. , not in acute distress, normal state of mind  Eyes:  EOMI, no exophthalmos Neck: Supple Cardiovascular: RRR, no murmurs, rubs, or gallops, no edema Respiratory: Adequate breathing efforts, no crackles, rales, rhonchi, or wheezing Musculoskeletal: no gross deformities, strength intact in all four extremities, no gross restriction of joint movements Skin:  no rashes, no hyperemia Neurological: no tremor with outstretched hands   CMP ( most recent) CMP     Component Value Date/Time   NA 137 06/19/2021 1332   NA 139 08/29/2020 1111   K 3.6 06/19/2021 1332   CL 105 06/19/2021 1332   CO2 25 06/19/2021 1332   GLUCOSE 87 06/19/2021 1332   BUN 10 06/19/2021 1332   BUN 5 (L) 08/29/2020 1111   CREATININE 0.80 06/19/2021 1332   CREATININE 0.66 08/02/2017 1458   CALCIUM 8.9 06/19/2021 1332   PROT 7.5 06/19/2021 1332   PROT 6.4 08/29/2020 1111   ALBUMIN 4.5 06/19/2021 1332   ALBUMIN 3.8 (L) 08/29/2020 1111   AST 31 06/19/2021 1332   ALT 40 06/19/2021 1332   ALKPHOS 66 06/19/2021 1332   BILITOT 1.7 (H) 06/19/2021 1332   BILITOT 0.3 08/29/2020 1111   GFRNONAA >60 06/19/2021 1332   GFRNONAA SEE NOTE 08/02/2017 1458   GFRAA 155 08/29/2020 1111   GFRAA SEE NOTE 08/02/2017 1458     Diabetic Labs (most recent): No results found for: HGBA1C   Lipid Panel ( most recent) Lipid Panel  No results found for: CHOL, TRIG, HDL, CHOLHDL, VLDL, LDLCALC, LDLDIRECT, LABVLDL     Lab Results  Component Value Date   TSH 8.500 (H) 08/24/2021   TSH 100.280 (H) 06/19/2021   TSH 63.700 (H) 03/29/2021   FREET4 1.22 08/24/2021   FREET4 0.33 (L) 03/29/2021    Thyroid US from 04/06/21  CLINICAL DATA:  Thyromegaly  and hypothyroidism   EXAM: THYROID ULTRASOUND   TECHNIQUE: Ultrasound examination of the thyroid gland and adjacent soft tissues was performed.   COMPARISON:  04/07/2018   FINDINGS: Parenchymal Echotexture: Moderately heterogeneous   Isthmus: 0.6 cm   Right lobe: 5.2 x 2.1 x 2.3 cm   Left lobe: 5.1 x 1.8 x 1.9 cm   _________________________________________________________   Estimated total number of nodules >/= 1 cm: 0   Number of spongiform nodules >/=  2 cm not described below (TR1): 0   Number of mixed cystic and solid nodules >/= 1.5 cm not described below (TR2): 0   _________________________________________________________   No discrete nodules are seen within the thyroid gland.   IMPRESSION: Diffusely heterogeneous thyroid without discrete nodule.   The above is in keeping with the ACR TI-RADS recommendations - J Am Coll Radiol 2017;14:587-595.     Electronically Signed   By: Miachel Roux M.D.   On: 04/06/2021 16:57  Assessment & Plan:   ASSESSMENT / PLAN:  1. Hypothyroidism-r/t Hashimotos Thyroiditis   Patient with relatively new hypothyroidism, on levothyroxine therapy.   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  She is advised to increase her dose of Levothyroxine to 75 mcg po daily before breakfast. Will recheck TFTs in 3 months and adjust dose further if needed.  - We discussed about correct intake of levothyroxine, at fasting, with water, separated by at least 30 minutes from breakfast, and separated by more than 4 hours from calcium, iron, multivitamins, acid reflux medications (PPIs). -Patient is made aware of the fact that thyroid hormone replacement is needed for life, dose to be adjusted by periodic monitoring of thyroid function tests.  I spent 21 minutes in the care of the patient today including review of labs from Thyroid Function, CMP, and other relevant labs  ; imaging/biopsy records (current and previous including abstractions from other facilities); face-to-face time discussing  her lab results and symptoms, medications doses, her options of short and long term treatment based on the latest standards of care / guidelines;   and documenting the encounter.  Dianah Field Rambeau  participated in the discussions, expressed understanding, and voiced agreement with the above plans.  All questions were answered to her satisfaction. she is encouraged to contact clinic should she have any questions or concerns prior to her return visit.   FOLLOW UP PLAN:  Return in about 3 months (around 11/30/2021) for Thyroid follow up, Previsit labs.   Rayetta Pigg, [facility] [address] [place], [place] [address] Phone: [phone] Fax: [phone]  08/31/2021, 2:49 PM

## 2021-11-03 ENCOUNTER — Ambulatory Visit
Admission: EM | Admit: 2021-11-03 | Discharge: 2021-11-03 | Disposition: A | Discharge status: Home / Self Care | Payer: Medicaid Other | Attending: Family Medicine | Admitting: Family Medicine

## 2021-11-03 ENCOUNTER — Other Ambulatory Visit: Payer: Self-pay

## 2021-11-03 DIAGNOSIS — J069 Acute upper respiratory infection, unspecified: Secondary | ICD-10-CM | POA: Diagnosis not present

## 2021-11-03 DIAGNOSIS — R509 Fever, unspecified: Secondary | ICD-10-CM | POA: Diagnosis not present

## 2021-11-03 MED ORDER — PROMETHAZINE-DM 6.25-15 MG/5ML PO SYRP: 5.0000 mL | ORAL_SOLUTION | Freq: Four times a day (QID) | ORAL | 0 refills | Status: DC | PRN

## 2021-11-03 NOTE — ED Provider Notes (Addendum)
[facility]    CSN: [id number] Arrival date & time: 11/03/21  0805      History   Chief Complaint Chief Complaint  Patient presents with   Generalized Body Aches   Headache   Fever   Nasal Congestion    HPI Kathleen Grimes is a 20 y.o. female.   Patient presenting today with 1 day history of fever, body aches, sore throat, drainage, mild cough.  Denies chest pain, shortness of breath, abdominal pain, nausea vomiting or diarrhea.  She has been taking Tylenol with mild temporary relief of symptoms.  No known sick contacts recently.  Has a history of seasonal allergies not currently on any antihistamines.  She states she is concerned that this is all because that she missed a few days of her Synthroid for hypothyroidism.   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   Elevated LFTs 04/26/2020   Hx of cholecystectomy 04/25/2020   Calculus of gallbladder without cholecystitis without obstruction 04/06/2020   Abdominal migraine 02/22/2015   Cyclical vomiting 02/22/2015   Allergic rhinitis 03/13/2013    Past Surgical History:  Procedure Laterality Date   CHOLECYSTECTOMY N/A 04/13/2020   Procedure: LAPAROSCOPIC CHOLECYSTECTOMY;  Surgeon: Lucretia Roers, MD;  Location: [facility];  Service: General;  Laterality: N/A;   CHOLECYSTECTOMY     CHOLECYSTECTOMY     WISDOM TOOTH EXTRACTION      OB History     Gravida  1   Para  1   Term  1   Preterm  0   AB  0   Living  1      SAB  0   IAB  0   Ectopic  0   Multiple  0   Live Births  1            Home Medications    Prior to Admission medications   Medication Sig Start Date End Date Taking? Authorizing Provider  promethazine-dextromethorphan  (PROMETHAZINE-DM) 6.25-15 MG/5ML syrup Take 5 mLs by mouth 4 (four) times daily as needed. 11/03/21  Yes Particia Nearing, PA-C  levothyroxine (SYNTHROID) 75 MCG tablet Take 1 tablet (75 mcg total) by mouth daily before breakfast. 08/31/21   Dani Gobble, NP    Family History Family History  Problem Relation Age of Onset   Cancer Father    Hypertension Father    Hyperlipidemia Father    Heart attack Father    Breast cancer Paternal Grandmother    Cancer Maternal Great-grandmother        bone marrow   Colon cancer Neg Hx    Esophageal cancer Neg Hx    Stomach cancer Neg Hx     Social History Social History   Tobacco Use   Smoking status: Never   Smokeless tobacco: Never  Vaping Use   Vaping Use: Never used  Substance Use Topics   Alcohol use: Yes    Comment: 1-2 drinks a month   Drug use: Not Currently    Types: Marijuana    Comment: 2 grams     Allergies   Hydrocodone and Hydrocodone   Review of Systems Review of Systems Per HPI  Physical Exam Triage Vital Signs ED  Triage Vitals [11/03/21 0819]  Enc Vitals Group     BP 103/67     Pulse Rate 88     Resp 14     Temp 98.9 F (37.2 C)     Temp Source Oral     SpO2 97 %     Weight      Height      Head Circumference      Peak Flow      Pain Score 9     Pain Loc      Pain Edu?      Excl. in GC?    No data found.  Updated Vital Signs BP 103/67 (BP Location: Right Arm)   Pulse 88   Temp 98.9 F (37.2 C) (Oral)   Resp 14   SpO2 97%   Visual Acuity Right Eye Distance:   Left Eye Distance:   Bilateral Distance:    Right Eye Near:   Left Eye Near:    Bilateral Near:     Physical Exam Vitals and nursing note reviewed.  Constitutional:      Appearance: Normal appearance.  HENT:     Head: Atraumatic.     Right Ear: Tympanic membrane and external ear normal.     Left Ear: Tympanic membrane and external ear normal.     Nose: Rhinorrhea present.     Mouth/Throat:     Mouth: Mucous  membranes are moist.     Pharynx: Posterior oropharyngeal erythema present.  Eyes:     Extraocular Movements: Extraocular movements intact.     Conjunctiva/sclera: Conjunctivae normal.  Cardiovascular:     Rate and Rhythm: Normal rate and regular rhythm.     Heart sounds: Normal heart sounds.  Pulmonary:     Effort: Pulmonary effort is normal.     Breath sounds: Normal breath sounds. No wheezing.  Musculoskeletal:        General: Normal range of motion.     Cervical back: Normal range of motion and neck supple.  Skin:    General: Skin is warm and dry.  Neurological:     Mental Status: She is alert and oriented to person, place, and time.  Psychiatric:        Mood and Affect: Mood normal.        Thought Content: Thought content normal.     UC Treatments / Results  Labs (all labs ordered are listed, but only abnormal results are displayed) Labs Reviewed  COVID-19, FLU A+B NAA    EKG   Radiology No results found.  Procedures Procedures (including critical care time)  Medications Ordered in UC Medications - No data to display  Initial Impression / Assessment and Plan / UC Course  I have reviewed the triage vital signs and the nursing notes.  Pertinent labs & imaging results that were available during my care of the patient were reviewed by me and considered in my medical decision making (see chart for details).     Vitals and exam overall reassuring today, suspect viral upper respiratory infection.  COVID and flu test pending.  Reassurance given that her thyroid is likely not the cause of her symptoms. she should however follow-up with her endocrinologist regarding her concerns and continue taking her medication compliantly.  We will give Phenergan DM for symptomatic management, discussed over-the-counter supportive medications and home care.  Return for acutely worsening symptoms.  Final Clinical Impressions(s) / UC Diagnoses   Final diagnoses:  Fever, unspecified  fever cause  Viral URI with cough   Discharge Instructions   None    ED Prescriptions     Medication Sig Dispense Auth. Provider   promethazine-dextromethorphan (PROMETHAZINE-DM) 6.25-15 MG/5ML syrup Take 5 mLs by mouth 4 (four) times daily as needed. 100 mL Particia Nearing, [place]      PDMP not reviewed this encounter.   Particia Nearing, [place] 11/03/21 0836    Particia Nearing, PA-C 11/03/21 [phone]

## 2021-11-03 NOTE — ED Triage Notes (Signed)
Pt presents with fever, body aches, scratchy throat, and nasal drainage that began yesterday. Pt took two tylenol PTA

## 2021-11-04 LAB — COVID-19, FLU A+B NAA
Influenza A, NAA: DETECTED — AB
Influenza B, NAA: NOT DETECTED
SARS-CoV-2, NAA: NOT DETECTED

## 2021-11-25 LAB — TSH: TSH: 2.59 u[IU]/mL (ref 0.450–4.500)

## 2021-11-25 LAB — T4, FREE: Free T4: 1.49 ng/dL (ref 0.82–1.77)

## 2021-11-30 ENCOUNTER — Encounter: Payer: Self-pay | Admitting: Nurse Practitioner

## 2021-11-30 ENCOUNTER — Other Ambulatory Visit: Payer: Self-pay

## 2021-11-30 ENCOUNTER — Ambulatory Visit (INDEPENDENT_AMBULATORY_CARE_PROVIDER_SITE_OTHER): Payer: Medicaid Other | Admitting: Nurse Practitioner

## 2021-11-30 VITALS — BP 92/57 | HR 76 | Ht 67.0 in | Wt 142.0 lb

## 2021-11-30 DIAGNOSIS — E063 Autoimmune thyroiditis: Secondary | ICD-10-CM | POA: Diagnosis not present

## 2021-11-30 DIAGNOSIS — E038 Other specified hypothyroidism: Secondary | ICD-10-CM | POA: Diagnosis not present

## 2021-11-30 MED ORDER — LEVOTHYROXINE SODIUM 75 MCG PO TABS: 75.0000 ug | ORAL_TABLET | Freq: Every day | ORAL | 3 refills | Status: DC

## 2021-11-30 NOTE — Patient Instructions (Signed)

## 2021-11-30 NOTE — Progress Notes (Signed)
Endocrinology Follow Up Note                                         11/30/2021, 2:20 PM  Subjective:   Subjective    Kathleen Grimes is a 20 y.o.-year-old female patient being seen in follow up after being seen in consultation for hypothyroidism referred by Royce Macadamia D., PA-C.   Past Medical History:  Diagnosis Date   Abdominal migraine    Allergic rhinitis 03/13/2013   Allergy    Anemia    Hypothyroidism     Past Surgical History:  Procedure Laterality Date   CHOLECYSTECTOMY N/A 04/13/2020   Procedure: LAPAROSCOPIC CHOLECYSTECTOMY;  Surgeon: Virl Cagey, MD;  Location: [facility];  Service: General;  Laterality: N/A;   CHOLECYSTECTOMY     CHOLECYSTECTOMY     WISDOM TOOTH EXTRACTION      Social History   Socioeconomic History   Marital status: Single    Spouse name: Not on file   Number of children: 1   Years of education: Not on file   Highest education level: Not on file  Occupational History   Not on file  Tobacco Use   Smoking status: Never   Smokeless tobacco: Never  Vaping Use   Vaping Use: Never used  Substance and Sexual Activity   Alcohol use: Yes    Comment: 1-2 drinks a month   Drug use: Not Currently    Types: Marijuana    Comment: 2 grams   Sexual activity: Not Currently    Birth control/protection: None  Other Topics Concern   Not on file  Social History Narrative   ** Merged History Encounter **       Social Determinants of Health   Financial Resource Strain: Medium Risk   Difficulty of Paying Living Expenses: Somewhat hard  Food Insecurity: No Food Insecurity   Worried About Charity fundraiser in the Last Year: Never true   Ran Out of Food in the Last Year: Never true  Transportation Needs: No Transportation Needs   Lack of Transportation (Medical): No   Lack of Transportation (Non-Medical): No  Physical Activity: Sufficiently Active   Days of Exercise per  Week: 4 days   Minutes of Exercise per Session: 40 min  Stress: No Stress Concern Present   Feeling of Stress : Only a little  Social Connections: Socially Isolated   Frequency of Communication with Friends and Family: More than three times a week   Frequency of Social Gatherings with Friends and Family: Twice a week   Attends Religious Services: Never   Marine scientist or Organizations: No   Attends Music therapist: Never   Marital Status: Never married    Family History  Problem Relation Age of Onset   Cancer Father    Hypertension Father    Hyperlipidemia Father    Heart attack Father    Breast cancer Paternal Grandmother    Cancer Maternal Great-grandmother  bone marrow   Colon cancer Neg Hx    Esophageal cancer Neg Hx    Stomach cancer Neg Hx     Outpatient Encounter Medications as of 11/30/2021  Medication Sig   [DISCONTINUED] levothyroxine (SYNTHROID) 75 MCG tablet Take 1 tablet (75 mcg total) by mouth daily before breakfast.   levothyroxine (SYNTHROID) 75 MCG tablet Take 1 tablet (75 mcg total) by mouth daily before breakfast.   [DISCONTINUED] promethazine-dextromethorphan (PROMETHAZINE-DM) 6.25-15 MG/5ML syrup Take 5 mLs by mouth 4 (four) times daily as needed. (Patient not taking: Reported on 11/30/2021)   No facility-administered encounter medications on file as of 11/30/2021.    ALLERGIES: Allergies  Allergen Reactions   Hydrocodone Nausea Only   Hydrocodone Nausea And Vomiting   VACCINATION STATUS: Immunization History  Administered Date(s) Administered   DTaP 03/06/2000, 05/07/2000, 07/05/2000, 08/06/2001, 06/06/2005   Hepatitis B 11-08-00, 02/09/2000, 10/15/2000   HiB (PRP-OMP) 03/06/2000, 05/07/2000, 07/05/2000, 08/06/2001   IPV 03/06/2000, 05/07/2000, 10/15/2000, 06/06/2005   Influenza Nasal 11/25/2007, 01/19/2008, 10/08/2008   Influenza Whole 10/11/2009   MMR 01/06/2001, 06/06/2005   Pneumococcal Conjugate-13  03/06/2000, 05/07/2000, 07/05/2000, 01/22/2003   Td 08/09/2011   Tdap 08/09/2011, 08/08/2020   Varicella 01/06/2001     HPI   Kathleen Grimes  is a patient with the above medical history. she was diagnosed with hypothyroidism at approximate age of 71 years while at her 6 week postpartum OBGYN visit, which required subsequent initiation of thyroid hormone replacement. she was given various doses of Levothyroxine since, had brief period of time were she did not take it at all, currently on 75 micrograms. she reports compliance to this medication:  Taking it daily on empty stomach  with water, separated by >30 minutes before breakfast and other medications, and by at least 4 hours from calcium, iron, PPIs, multivitamins .  I reviewed patient's thyroid tests:  Lab Results  Component Value Date   TSH 2.590 11/24/2021   TSH 8.500 (H) 08/24/2021   TSH 100.280 (H) 06/19/2021   TSH 63.700 (H) 03/29/2021   FREET4 1.49 11/24/2021   FREET4 1.22 08/24/2021   FREET4 0.33 (L) 03/29/2021     Pt denies feeling nodules in neck, hoarseness, dysphagia/odynophagia, SOB with lying down.  she denies family history of thyroid disorders.  No family history of thyroid cancer.  No history of radiation therapy to head or neck.  No recent use of iodine supplements.  Denies use of Biotin containing supplements.   Review of systems  Constitutional: + Minimally fluctuating body weight,  current Body mass index is 22.24 kg/m. , no fatigue, no subjective hyperthermia, no subjective hypothermia Eyes: no blurry vision, no xerophthalmia ENT: no sore throat, no nodules palpated in throat, no dysphagia/odynophagia, no hoarseness Cardiovascular: no chest pain, no shortness of breath, no palpitations, no leg swelling Respiratory: no cough, no shortness of breath Gastrointestinal: no nausea/vomiting/diarrhea Musculoskeletal: no muscle/joint aches Skin: no rashes, no hyperemia Neurological: no tremors, no numbness, no  tingling, no dizziness Psychiatric: no depression, no anxiety   Objective:   Objective     BP (!) 92/57    Pulse 76    Ht '5\' 7"'  (1.702 m)    Wt 142 lb (64.4 kg)    BMI 22.24 kg/m  Wt Readings from Last 3 Encounters:  11/30/21 142 lb (64.4 kg)  08/31/21 146 lb 3.2 oz (66.3 kg)  07/27/21 146 lb (66.2 kg)    BP Readings from Last 3 Encounters:  11/30/21 (!) 92/57  11/03/21 103/67  08/31/21 98/60      Physical Exam- Limited  Constitutional:  Body mass index is 22.24 kg/m. , not in acute distress, normal state of mind Eyes:  EOMI, no exophthalmos Neck: Supple Cardiovascular: RRR, no murmurs, rubs, or gallops, no edema Respiratory: Adequate breathing efforts, no crackles, rales, rhonchi, or wheezing Musculoskeletal: no gross deformities, strength intact in all four extremities, no gross restriction of joint movements Skin:  no rashes, no hyperemia Neurological: no tremor with outstretched hands   CMP ( most recent) CMP     Component Value Date/Time   NA 137 06/19/2021 1332   NA 139 08/29/2020 1111   K 3.6 06/19/2021 1332   CL 105 06/19/2021 1332   CO2 25 06/19/2021 1332   GLUCOSE 87 06/19/2021 1332   BUN 10 06/19/2021 1332   BUN 5 (L) 08/29/2020 1111   CREATININE 0.80 06/19/2021 1332   CREATININE 0.66 08/02/2017 1458   CALCIUM 8.9 06/19/2021 1332   PROT 7.5 06/19/2021 1332   PROT 6.4 08/29/2020 1111   ALBUMIN 4.5 06/19/2021 1332   ALBUMIN 3.8 (L) 08/29/2020 1111   AST 31 06/19/2021 1332   ALT 40 06/19/2021 1332   ALKPHOS 66 06/19/2021 1332   BILITOT 1.7 (H) 06/19/2021 1332   BILITOT 0.3 08/29/2020 1111   GFRNONAA >60 06/19/2021 1332   GFRNONAA SEE NOTE 08/02/2017 1458   GFRAA 155 08/29/2020 1111   GFRAA SEE NOTE 08/02/2017 1458     Diabetic Labs (most recent): No results found for: HGBA1C   Lipid Panel ( most recent) Lipid Panel  No results found for: CHOL, TRIG, HDL, CHOLHDL, VLDL, LDLCALC, LDLDIRECT, LABVLDL     Lab Results  Component Value  Date   TSH 2.590 11/24/2021   TSH 8.500 (H) 08/24/2021   TSH 100.280 (H) 06/19/2021   TSH 63.700 (H) 03/29/2021   FREET4 1.49 11/24/2021   FREET4 1.22 08/24/2021   FREET4 0.33 (L) 03/29/2021    Thyroid US from 04/06/21  CLINICAL DATA:  Thyromegaly and hypothyroidism   EXAM: THYROID ULTRASOUND   TECHNIQUE: Ultrasound examination of the thyroid gland and adjacent soft tissues was performed.   COMPARISON:  04/07/2018   FINDINGS: Parenchymal Echotexture: Moderately heterogeneous   Isthmus: 0.6 cm   Right lobe: 5.2 x 2.1 x 2.3 cm   Left lobe: 5.1 x 1.8 x 1.9 cm   _________________________________________________________   Estimated total number of nodules >/= 1 cm: 0   Number of spongiform nodules >/=  2 cm not described below (TR1): 0   Number of mixed cystic and solid nodules >/= 1.5 cm not described below (TR2): 0   _________________________________________________________   No discrete nodules are seen within the thyroid gland.   IMPRESSION: Diffusely heterogeneous thyroid without discrete nodule.   The above is in keeping with the ACR TI-RADS recommendations - J Am Coll Radiol 2017;14:587-595.     Electronically Signed   By: Miachel Roux M.D.   On: 04/06/2021 16:57    Latest Reference Range & Units 03/29/21 14:09 06/19/21 11:27 08/24/21 13:33 11/24/21 13:28  TSH 0.450 - 4.500 uIU/mL 63.700 (H) 100.280 (H) 8.500 (H) 2.590  Triiodothyronine (T3) 71 - 180 ng/dL 73     T4,Free(Direct) 0.82 - 1.77 ng/dL 0.33 (L)  1.22 1.49  Thyroperoxidase Ab SerPl-aCnc 0 - 34 IU/mL   104 (H)   Thyroglobulin Antibody 0.0 - 0.9 IU/mL   <1.0   (H): Data is abnormally high (L): Data is abnormally low  Assessment & Plan:   ASSESSMENT /  PLAN:  1. Hypothyroidism-r/t Hashimotos Thyroiditis   Patient with relatively new hypothyroidism, on levothyroxine therapy.   -Her previsit thyroid function tests are consistent with appropriate hormone replacement.  She is advised to  continue Levothyroxine 75 mcg po daily before breakfast.  - We discussed about correct intake of levothyroxine, at fasting, with water, separated by at least 30 minutes from breakfast, and separated by more than 4 hours from calcium, iron, multivitamins, acid reflux medications (PPIs). -Patient is made aware of the fact that thyroid hormone replacement is needed for life, dose to be adjusted by periodic monitoring of thyroid function tests.    I spent 20 minutes in the care of the patient today including review of labs from Thyroid Function, CMP, and other relevant labs ; imaging/biopsy records (current and previous including abstractions from other facilities); face-to-face time discussing  her lab results and symptoms, medications doses, her options of short and long term treatment based on the latest standards of care / guidelines;   and documenting the encounter.  Dianah Field Colvin  participated in the discussions, expressed understanding, and voiced agreement with the above plans.  All questions were answered to her satisfaction. she is encouraged to contact clinic should she have any questions or concerns prior to her return visit.   FOLLOW UP PLAN:  Return in about 4 months (around 03/31/2022) for Thyroid follow up, Previsit labs.   Rayetta Pigg, [facility] [address] [place], [place] [address] Phone: [phone] Fax: [phone]  11/30/2021, 2:20 PM

## 2022-03-30 LAB — T4, FREE: Free T4: 1.22 ng/dL (ref 0.82–1.77)

## 2022-03-30 LAB — TSH: TSH: 3.21 u[IU]/mL (ref 0.450–4.500)

## 2022-04-02 ENCOUNTER — Ambulatory Visit (INDEPENDENT_AMBULATORY_CARE_PROVIDER_SITE_OTHER): Payer: Medicaid Other | Admitting: Nurse Practitioner

## 2022-04-02 ENCOUNTER — Encounter: Payer: Self-pay | Admitting: Nurse Practitioner

## 2022-04-02 VITALS — BP 100/62 | HR 81 | Ht 67.0 in | Wt 142.8 lb

## 2022-04-02 DIAGNOSIS — E063 Autoimmune thyroiditis: Secondary | ICD-10-CM | POA: Diagnosis not present

## 2022-04-02 DIAGNOSIS — E038 Other specified hypothyroidism: Secondary | ICD-10-CM | POA: Diagnosis not present

## 2022-04-02 NOTE — Patient Instructions (Signed)

## 2022-04-02 NOTE — Progress Notes (Signed)
?                                   ?                                Endocrinology Follow Up Note  ?                                       04/02/2022, 2:52 PM ? ?Subjective:  ? ?Subjective   ? ?Kathleen Grimes is a 21 y.o.-year-old female patient being seen in follow up after being seen in consultation for hypothyroidism referred by Raiford Simmonds., PA-C. ? ? ?Past Medical History:  ?Diagnosis Date  ? Abdominal migraine   ? Allergic rhinitis 03/13/2013  ? Allergy   ? Anemia   ? Hypothyroidism   ? ? ?Past Surgical History:  ?Procedure Laterality Date  ? CHOLECYSTECTOMY N/A 04/13/2020  ? Procedure: LAPAROSCOPIC CHOLECYSTECTOMY;  Surgeon: Virl Cagey, MD;  Location: [facility];  Service: General;  Laterality: N/A;  ? CHOLECYSTECTOMY    ? CHOLECYSTECTOMY    ? WISDOM TOOTH EXTRACTION    ? ? ?Social History  ? ?Socioeconomic History  ? Marital status: Single  ?  Spouse name: Not on file  ? Number of children: 1  ? Years of education: Not on file  ? Highest education level: Not on file  ?Occupational History  ? Not on file  ?Tobacco Use  ? Smoking status: Never  ? Smokeless tobacco: Never  ?Vaping Use  ? Vaping Use: Never used  ?Substance and Sexual Activity  ? Alcohol use: Yes  ?  Comment: 1-2 drinks a month  ? Drug use: Not Currently  ?  Types: Marijuana  ?  Comment: 2 grams  ? Sexual activity: Not Currently  ?  Birth control/protection: None  ?Other Topics Concern  ? Not on file  ?Social History Narrative  ? ** Merged History Encounter **  ?    ? ?Social Determinants of Health  ? ?Financial Resource Strain: Not on file  ?Food Insecurity: Not on file  ?Transportation Needs: Not on file  ?Physical Activity: Not on file  ?Stress: Not on file  ?Social Connections: Not on file  ? ? ?Family History  ?Problem Relation Age of Onset  ? Cancer Father   ? Hypertension Father   ? Hyperlipidemia Father   ? Heart attack Father   ? Breast cancer Paternal Grandmother   ? Cancer Maternal Great-grandmother   ?     bone marrow  ?  Colon cancer Neg Hx   ? Esophageal cancer Neg Hx   ? Stomach cancer Neg Hx   ? ? ?Outpatient Encounter Medications as of 04/02/2022  ?Medication Sig  ? levothyroxine (SYNTHROID) 75 MCG tablet Take 1 tablet (75 mcg total) by mouth daily before breakfast.  ? ?No facility-administered encounter medications on file as of 04/02/2022.  ? ? ?ALLERGIES: ?Allergies  ?Allergen Reactions  ? Hydrocodone Nausea Only  ? Hydrocodone Nausea And Vomiting  ? ?VACCINATION STATUS: ?Immunization History  ?Administered Date(s) Administered  ? DTaP 03/06/2000, 05/07/2000, 07/05/2000, 08/06/2001, 06/06/2005  ? Hepatitis B 09-19-2000, 02/09/2000, 10/15/2000  ? HiB (PRP-OMP) 03/06/2000, 05/07/2000, 07/05/2000, 08/06/2001  ? IPV 03/06/2000, 05/07/2000, 10/15/2000, 06/06/2005  ? Influenza Nasal 11/25/2007,  01/19/2008, 10/08/2008  ? Influenza Whole 10/11/2009  ? MMR 01/06/2001, 06/06/2005  ? Pneumococcal Conjugate-13 03/06/2000, 05/07/2000, 07/05/2000, 01/22/2003  ? Td 08/09/2011  ? Tdap 08/09/2011, 08/08/2020  ? Varicella 01/06/2001  ? ? ? ?HPI  ? ?Kathleen Grimes  is a patient with the above medical history. she was diagnosed with hypothyroidism at approximate age of 11 years while at her 6 week postpartum OBGYN visit, which required subsequent initiation of thyroid hormone replacement. she was given various doses of Levothyroxine since, had brief period of time were she did not take it at all, currently on 75 micrograms. she reports compliance to this medication:  Taking it daily on empty stomach  with water, separated by >30 minutes before breakfast and other medications, and by at least 4 hours from calcium, iron, PPIs, multivitamins . ? ?I reviewed patient's thyroid tests: ? ?Lab Results  ?Component Value Date  ? TSH 3.210 03/29/2022  ? TSH 2.590 11/24/2021  ? TSH 8.500 (H) 08/24/2021  ? TSH 100.280 (H) 06/19/2021  ? TSH 63.700 (H) 03/29/2021  ? FREET4 1.22 03/29/2022  ? FREET4 1.49 11/24/2021  ? FREET4 1.22 08/24/2021  ? FREET4 0.33 (L)  03/29/2021  ?  ? ?Pt denies feeling nodules in neck, hoarseness, dysphagia/odynophagia, SOB with lying down. ? ?she denies family history of thyroid disorders.  No family history of thyroid cancer.  ?No history of radiation therapy to head or neck.  No recent use of iodine supplements.  Denies use of Biotin containing supplements. ? ? ?Review of systems ? ?Constitutional: + Minimally fluctuating body weight,  current Body mass index is 22.37 kg/m?. , no fatigue, no subjective hyperthermia, no subjective hypothermia ?Eyes: no blurry vision, no xerophthalmia ?ENT: + sore throat-started last night, no nodules palpated in throat, no dysphagia/odynophagia, no hoarseness ?Cardiovascular: no chest pain, no shortness of breath, no palpitations, no leg swelling ?Respiratory: no cough, no shortness of breath ?Gastrointestinal: no nausea/vomiting/diarrhea ?Musculoskeletal: no muscle/joint aches ?Skin: no rashes, no hyperemia ?Neurological: no tremors, no numbness, no tingling, no dizziness ?Psychiatric: no depression, no anxiety ? ? ?Objective:  ? ?Objective   ? ? ?BP 100/62   Pulse 81   Ht '5\' 7"'  (1.702 m)   Wt 142 lb 12.8 oz (64.8 kg)   SpO2 97%   BMI 22.37 kg/m?  ?Wt Readings from Last 3 Encounters:  ?04/02/22 142 lb 12.8 oz (64.8 kg)  ?11/30/21 142 lb (64.4 kg)  ?08/31/21 146 lb 3.2 oz (66.3 kg)  ? ? ?BP Readings from Last 3 Encounters:  ?04/02/22 100/62  ?11/30/21 (!) 92/57  ?11/03/21 103/67  ?  ? ? ?Physical Exam- Limited ? ?Constitutional:  Body mass index is 22.37 kg/m?. , not in acute distress, normal state of mind ?Eyes:  EOMI, no exophthalmos ?Neck/Throat: Supple, erythematous with clear drainage ?Thyroid: + gross goiter ?Cardiovascular: RRR, no murmurs, rubs, or gallops, no edema ?Respiratory: Adequate breathing efforts, no crackles, rales, rhonchi, or wheezing ?Musculoskeletal: no gross deformities, strength intact in all four extremities, no gross restriction of joint movements ?Skin:  no rashes, no  hyperemia ?Neurological: no tremor with outstretched hands ? ? ?CMP ( most recent) ?CMP  ?   ?Component Value Date/Time  ? NA 137 06/19/2021 1332  ? NA 139 08/29/2020 1111  ? K 3.6 06/19/2021 1332  ? CL 105 06/19/2021 1332  ? CO2 25 06/19/2021 1332  ? GLUCOSE 87 06/19/2021 1332  ? BUN 10 06/19/2021 1332  ? BUN 5 (L) 08/29/2020 1111  ? CREATININE 0.80  06/19/2021 1332  ? CREATININE 0.66 08/02/2017 1458  ? CALCIUM 8.9 06/19/2021 1332  ? PROT 7.5 06/19/2021 1332  ? PROT 6.4 08/29/2020 1111  ? ALBUMIN 4.5 06/19/2021 1332  ? ALBUMIN 3.8 (L) 08/29/2020 1111  ? AST 31 06/19/2021 1332  ? ALT 40 06/19/2021 1332  ? ALKPHOS 66 06/19/2021 1332  ? BILITOT 1.7 (H) 06/19/2021 1332  ? BILITOT 0.3 08/29/2020 1111  ? GFRNONAA >60 06/19/2021 1332  ? GFRNONAA SEE NOTE 08/02/2017 1458  ? GFRAA 155 08/29/2020 1111  ? GFRAA SEE NOTE 08/02/2017 1458  ? ? ? ?Diabetic Labs (most recent): ?No results found for: HGBA1C ? ? Lipid Panel ( most recent) ?Lipid Panel  ?No results found for: CHOL, TRIG, HDL, CHOLHDL, VLDL, LDLCALC, LDLDIRECT, LABVLDL ?  ? ? ?Lab Results  ?Component Value Date  ? TSH 3.210 03/29/2022  ? TSH 2.590 11/24/2021  ? TSH 8.500 (H) 08/24/2021  ? TSH 100.280 (H) 06/19/2021  ? TSH 63.700 (H) 03/29/2021  ? FREET4 1.22 03/29/2022  ? FREET4 1.49 11/24/2021  ? FREET4 1.22 08/24/2021  ? FREET4 0.33 (L) 03/29/2021  ?  ?Thyroid US from 04/06/21 ? ?CLINICAL DATA:  Thyromegaly and hypothyroidism ?  ?EXAM: ?THYROID ULTRASOUND ?  ?TECHNIQUE: ?Ultrasound examination of the thyroid gland and adjacent soft ?tissues was performed. ?  ?COMPARISON:  04/07/2018 ?  ?FINDINGS: ?Parenchymal Echotexture: Moderately heterogeneous ?  ?Isthmus: 0.6 cm ?  ?Right lobe: 5.2 x 2.1 x 2.3 cm ?  ?Left lobe: 5.1 x 1.8 x 1.9 cm ?  ?_________________________________________________________ ?  ?Estimated total number of nodules >/= 1 cm: 0 ?  ?Number of spongiform nodules >/=  2 cm not described below (TR1): 0 ?  ?Number of mixed cystic and solid nodules >/= 1.5  cm not described ?below ([place]): 0 ?  ?_________________________________________________________ ?  ?No discrete nodules are seen within the thyroid gland. ?  ?IMPRESSION: ?Diffusely heterogeneous thyroid without discrete no

## 2022-05-12 ENCOUNTER — Ambulatory Visit
Admission: EM | Admit: 2022-05-12 | Discharge: 2022-05-12 | Disposition: A | Discharge status: Home / Self Care | Payer: Medicaid Other

## 2022-05-12 DIAGNOSIS — R000 Tachycardia, unspecified: Secondary | ICD-10-CM | POA: Diagnosis not present

## 2022-05-12 DIAGNOSIS — J0300 Acute streptococcal tonsillitis, unspecified: Secondary | ICD-10-CM

## 2022-05-12 DIAGNOSIS — R509 Fever, unspecified: Secondary | ICD-10-CM | POA: Diagnosis not present

## 2022-05-12 LAB — POCT RAPID STREP A (OFFICE): Rapid Strep A Screen: POSITIVE — AB

## 2022-05-12 MED ORDER — LIDOCAINE VISCOUS HCL 2 % MT SOLN: 10.0000 mL | OROMUCOSAL | 0 refills | Status: DC | PRN

## 2022-05-12 MED ORDER — AMOXICILLIN 875 MG PO TABS: 875.0000 mg | ORAL_TABLET | Freq: Two times a day (BID) | ORAL | 0 refills | Status: DC

## 2022-05-12 NOTE — ED Triage Notes (Signed)
Pt reports sore throat, fever 102.3 F and left ear pain x 3 days. Tylenol gives relief with the fever.

## 2022-05-12 NOTE — ED Provider Notes (Addendum)
[facility]    CSN: [id number] Arrival date & time: 05/12/22  1020      History   Chief Complaint Chief Complaint  Patient presents with   Sore Throat   Otalgia        Fever    HPI Kathleen Grimes is a 21 y.o. female.   Presenting today with 3-day history of progressively worsening sore throat, high fever, left ear pain.  Denies difficulty breathing or swallowing, chest pain, shortness of breath, cough, congestion, abdominal pain, nausea vomiting or diarrhea.  Taking Tylenol with no benefit.  No known sick contacts recently.   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   Elevated LFTs 04/26/2020   Hx of cholecystectomy 04/25/2020   Calculus of gallbladder without cholecystitis without obstruction 04/06/2020   Abdominal migraine [id number]   Cyclical vomiting [id number]   Allergic rhinitis 03/13/2013    Past Surgical History:  Procedure Laterality Date   CHOLECYSTECTOMY N/A 04/13/2020   Procedure: LAPAROSCOPIC CHOLECYSTECTOMY;  Surgeon: Virl Cagey, MD;  Location: [facility];  Service: General;  Laterality: N/A;   CHOLECYSTECTOMY     CHOLECYSTECTOMY     WISDOM TOOTH EXTRACTION      OB History     Gravida  1   Para  1   Term  1   Preterm  0   AB  0   Living  1      SAB  0   IAB  0   Ectopic  0   Multiple  0   Live Births  1            Home Medications    Prior to Admission medications   Medication Sig Start Date End Date Taking? Authorizing Provider  acetaminophen (TYLENOL) 500 MG tablet Take 500 mg by mouth every 6 (six) hours as needed.   Yes [provider]  amoxicillin (AMOXIL) 875 MG tablet Take 1 tablet (875 mg total) by mouth 2 (two) times daily. 05/12/22  Yes  Volney American, PA-C  lidocaine (XYLOCAINE) 2 % solution Use as directed 10 mLs in the mouth or throat every 3 (three) hours as needed for mouth pain. 05/12/22  Yes Volney American, PA-C  levothyroxine (SYNTHROID) 75 MCG tablet Take 1 tablet (75 mcg total) by mouth daily before breakfast. 11/30/21   Brita Romp, NP    Family History Family History  Problem Relation Age of Onset   Cancer Father    Hypertension Father    Hyperlipidemia Father    Heart attack Father    Breast cancer Paternal Grandmother    Cancer Maternal Great-grandmother        bone marrow   Colon cancer Neg Hx    Esophageal cancer Neg Hx    Stomach cancer Neg Hx     Social History Social History   Tobacco Use   Smoking status: Never   Smokeless tobacco: Never  Vaping Use   Vaping Use: Never used  Substance Use Topics   Alcohol use: Yes    Comment: 1-2 drinks a month   Drug use: Not Currently    Types: Marijuana    Comment: 2 grams     Allergies  Hydrocodone and Hydrocodone   Review of Systems Review of Systems Per HPI  Physical Exam Triage Vital Signs ED Triage Vitals  Enc Vitals Group     BP 05/12/22 1044 109/72     Pulse Rate 05/12/22 1044 (!) 109     Resp 05/12/22 1044 18     Temp 05/12/22 1044 (!) 100.7 F (38.2 C)     Temp Source 05/12/22 1044 Oral     SpO2 05/12/22 1044 98 %     Weight --      Height --      Head Circumference --      Peak Flow --      Pain Score 05/12/22 1046 10     Pain Loc --      Pain Edu? --      Excl. in [place]? --    No data found.  Updated Vital Signs BP 109/72 (BP Location: Right Arm)   Pulse (!) 109   Temp (!) 100.7 F (38.2 C) (Oral)   Resp 18   LMP 04/29/2022 (Exact Date)   SpO2 98%   Breastfeeding No   Visual Acuity Right Eye Distance:   Left Eye Distance:   Bilateral Distance:    Right Eye Near:   Left Eye Near:    Bilateral Near:     Physical Exam Vitals and nursing note reviewed.  Constitutional:       Appearance: Normal appearance.  HENT:     Head: Atraumatic.     Right Ear: Tympanic membrane and external ear normal.     Left Ear: Tympanic membrane and external ear normal.     Nose: Nose normal.     Mouth/Throat:     Mouth: Mucous membranes are moist.     Pharynx: Oropharyngeal exudate and posterior oropharyngeal erythema present.     Comments: Significant left-sided tonsillar erythema, edema, exudates.  Uvula midline, oral airway patent. Eyes:     Extraocular Movements: Extraocular movements intact.     Conjunctiva/sclera: Conjunctivae normal.  Cardiovascular:     Rate and Rhythm: Normal rate and regular rhythm.     Heart sounds: Normal heart sounds.  Pulmonary:     Effort: Pulmonary effort is normal.     Breath sounds: Normal breath sounds. No wheezing.  Musculoskeletal:        General: Normal range of motion.     Cervical back: Normal range of motion and neck supple.  Lymphadenopathy:     Cervical: Cervical adenopathy present.  Skin:    General: Skin is warm and dry.  Neurological:     Mental Status: She is alert and oriented to person, place, and time.  Psychiatric:        Mood and Affect: Mood normal.        Thought Content: Thought content normal.     UC Treatments / Results  Labs (all labs ordered are listed, but only abnormal results are displayed) Labs Reviewed  POCT RAPID STREP A (OFFICE) - Abnormal; Notable for the following components:      Result Value   Rapid Strep A Screen Positive (*)    All other components within normal limits    EKG   Radiology No results found.  Procedures Procedures (including critical care time)  Medications Ordered in UC Medications - No data to display  Initial Impression / Assessment and Plan / UC Course  I have reviewed the triage vital signs and the nursing notes.  Pertinent labs & imaging results that  were available during my care of the patient were reviewed by me and considered in my medical decision making  (see chart for details).     Rapid strep positive, treat with amoxicillin, viscous lidocaine, supportive over-the-counter medications and home care.  Return for worsening symptoms.  Final Clinical Impressions(s) / UC Diagnoses   Final diagnoses:  Strep tonsillitis  Fever, unspecified  Tachycardia   Discharge Instructions   None    ED Prescriptions     Medication Sig Dispense Auth. Provider   amoxicillin (AMOXIL) 875 MG tablet Take 1 tablet (875 mg total) by mouth 2 (two) times daily. 20 tablet Volney American, PA-C   lidocaine (XYLOCAINE) 2 % solution Use as directed 10 mLs in the mouth or throat every 3 (three) hours as needed for mouth pain. 100 mL Volney American, [place]      PDMP not reviewed this encounter.   Volney American, [place] 05/12/22 [place], [place], [place] 05/12/22 1140

## 2022-07-29 ENCOUNTER — Emergency Department (HOSPITAL_COMMUNITY)
Admission: EM | Admit: 2022-07-29 | Discharge: 2022-07-29 | Disposition: A | Discharge status: Home / Self Care | Payer: Medicaid Other | Attending: Emergency Medicine | Admitting: Emergency Medicine

## 2022-07-29 ENCOUNTER — Other Ambulatory Visit: Payer: Self-pay

## 2022-07-29 ENCOUNTER — Encounter (HOSPITAL_COMMUNITY): Payer: Self-pay

## 2022-07-29 DIAGNOSIS — R1033 Periumbilical pain: Secondary | ICD-10-CM | POA: Diagnosis not present

## 2022-07-29 DIAGNOSIS — R109 Unspecified abdominal pain: Secondary | ICD-10-CM | POA: Diagnosis present

## 2022-07-29 DIAGNOSIS — R1110 Vomiting, unspecified: Secondary | ICD-10-CM | POA: Diagnosis not present

## 2022-07-29 DIAGNOSIS — R1013 Epigastric pain: Secondary | ICD-10-CM | POA: Insufficient documentation

## 2022-07-29 DIAGNOSIS — Z79899 Other long term (current) drug therapy: Secondary | ICD-10-CM | POA: Insufficient documentation

## 2022-07-29 DIAGNOSIS — E039 Hypothyroidism, unspecified: Secondary | ICD-10-CM | POA: Diagnosis not present

## 2022-07-29 LAB — COMPREHENSIVE METABOLIC PANEL
ALT: 37 U/L (ref 0–44)
AST: 54 U/L — ABNORMAL HIGH (ref 15–41)
Albumin: 4.2 g/dL (ref 3.5–5.0)
Alkaline Phosphatase: 62 U/L (ref 38–126)
Anion gap: 10 (ref 5–15)
BUN: 13 mg/dL (ref 6–20)
CO2: 23 mmol/L (ref 22–32)
Calcium: 9 mg/dL (ref 8.9–10.3)
Chloride: 102 mmol/L (ref 98–111)
Creatinine, Ser: 0.66 mg/dL (ref 0.44–1.00)
GFR, Estimated: >60 mL/min (ref >60)
Glucose, Bld: 118 mg/dL — ABNORMAL HIGH (ref 70–99)
Potassium: 3.5 mmol/L (ref 3.5–5.1)
Sodium: 135 mmol/L (ref 135–145)
Total Bilirubin: 1.4 mg/dL — ABNORMAL HIGH (ref 0.3–1.2)
Total Protein: 7.7 g/dL (ref 6.5–8.1)

## 2022-07-29 LAB — DIFFERENTIAL
Abs Immature Granulocytes: 0.02 10*3/uL (ref 0.00–0.07)
Basophils Absolute: 0.1 10*3/uL (ref 0.0–0.1)
Basophils Relative: 1 %
Eosinophils Absolute: 0.9 10*3/uL — ABNORMAL HIGH (ref 0.0–0.5)
Eosinophils Relative: 12 %
Immature Granulocytes: 0 %
Lymphocytes Relative: 54 %
Lymphs Abs: 3.8 10*3/uL (ref 0.7–4.0)
Monocytes Absolute: 0.4 10*3/uL (ref 0.1–1.0)
Monocytes Relative: 6 %
Neutro Abs: 2 10*3/uL (ref 1.7–7.7)
Neutrophils Relative %: 27 %

## 2022-07-29 LAB — URINALYSIS, ROUTINE W REFLEX MICROSCOPIC
Bilirubin Urine: NEGATIVE
Glucose, UA: NEGATIVE mg/dL
Hgb urine dipstick: NEGATIVE
Ketones, ur: NEGATIVE mg/dL
Nitrite: NEGATIVE
Protein, ur: 30 mg/dL — AB
Specific Gravity, Urine: 1.019 (ref 1.005–1.030)
pH: 9 — ABNORMAL HIGH (ref 5.0–8.0)

## 2022-07-29 LAB — CBC
HCT: 39.4 % (ref 36.0–46.0)
Hemoglobin: 13.7 g/dL (ref 12.0–15.0)
MCH: 30.9 pg (ref 26.0–34.0)
MCHC: 34.8 g/dL (ref 30.0–36.0)
MCV: 88.9 fL (ref 80.0–100.0)
Platelets: 188 10*3/uL (ref 150–400)
RBC: 4.43 MIL/uL (ref 3.87–5.11)
RDW: 13.7 % (ref 11.5–15.5)
WBC: 7.2 10*3/uL (ref 4.0–10.5)
nRBC: 0 % (ref 0.0–0.2)

## 2022-07-29 LAB — LIPASE, BLOOD: Lipase: 24 U/L (ref 11–51)

## 2022-07-29 LAB — POC URINE PREG, ED: Preg Test, Ur: NEGATIVE

## 2022-07-29 MED ORDER — DICYCLOMINE HCL 20 MG PO TABS: 20.0000 mg | ORAL_TABLET | Freq: Two times a day (BID) | ORAL | 0 refills | Status: DC

## 2022-07-29 MED ORDER — SODIUM CHLORIDE 0.9 % IV BOLUS
1000.0000 mL | Freq: Once | INTRAVENOUS | Status: AC
  Administered 2022-07-29: 1000 mL via INTRAVENOUS

## 2022-07-29 MED ORDER — DICYCLOMINE HCL 10 MG/ML IM SOLN
20.0000 mg | Freq: Once | INTRAMUSCULAR | Status: AC
  Administered 2022-07-29: 20 mg via INTRAMUSCULAR
  Filled 2022-07-29: qty 2

## 2022-07-29 MED ORDER — ONDANSETRON HCL 4 MG/2ML IJ SOLN
4.0000 mg | Freq: Once | INTRAMUSCULAR | Status: AC | PRN
  Administered 2022-07-29: 4 mg via INTRAVENOUS
  Filled 2022-07-29: qty 2

## 2022-07-29 NOTE — ED Triage Notes (Signed)
Pt hunched over in a wheel chair, states that she had breakfast and softly after started feeling poorly with abd cramping.  Pt denies vomiting, but c/o nausea.

## 2022-07-29 NOTE — ED Provider Notes (Signed)
[facility] EMERGENCY DEPARTMENT Provider Note   CSN: [id number] Arrival date & time: 07/29/22  1221     History  Chief Complaint  Patient presents with   Abdominal Pain    Kathleen Grimes is a 21 y.o. female.   Abdominal Pain    Patient with a documented history of abdominal migraines, hypothyroid, anemia status post cholecystectomy presents today due to abdominal pain.  The abdominal pain started acutely after eating lunch today.  Associated with 1 episode of emesis, no hematemesis or coffee-ground emesis.  The pain is feels like a cramping pain, its periumbilical but also goes to the epigastric area and sometimes radiates to the back.  States he drank alcohol last night, denies significant amount of alcohol.  Denies any NSAIDs today, not having any vaginal pain, vaginal bleeding, dysuria, hematuria, flank pain.   Home Medications Prior to Admission medications   Medication Sig Start Date End Date Taking? Authorizing Provider  dicyclomine (BENTYL) 20 MG tablet Take 1 tablet (20 mg total) by mouth 2 (two) times daily. 07/29/22  Yes Theron Arista, PA-C  acetaminophen (TYLENOL) 500 MG tablet Take 500 mg by mouth every 6 (six) hours as needed.    [provider]  amoxicillin (AMOXIL) 875 MG tablet Take 1 tablet (875 mg total) by mouth 2 (two) times daily. 05/12/22   Particia Nearing, PA-C  levothyroxine (SYNTHROID) 75 MCG tablet Take 1 tablet (75 mcg total) by mouth daily before breakfast. 11/30/21   Dani Gobble, NP  lidocaine (XYLOCAINE) 2 % solution Use as directed 10 mLs in the mouth or throat every 3 (three) hours as needed for mouth pain. 05/12/22   Particia Nearing, PA-C      Allergies    Hydrocodone and Hydrocodone    Review of Systems   Review of Systems  Gastrointestinal:  Positive for abdominal pain.    Physical Exam Updated Vital Signs BP (!) 89/56   Pulse 64   Temp 97.6 F (36.4 C) (Oral)   Resp 17   Ht 5\' 7"  (1.702 m)   Wt 61.2 kg    SpO2 100%   BMI 21.14 kg/m  Physical Exam Vitals and nursing note reviewed. Exam conducted with a chaperone present.  Constitutional:      Appearance: Normal appearance.     Comments: Well-appearing, patient's-year-old son is climbing over her belly and sitting on her chest.  She is not reacting or pulling away.  HENT:     Head: Normocephalic and atraumatic.  Eyes:     General: No scleral icterus.       Right eye: No discharge.        Left eye: No discharge.     Extraocular Movements: Extraocular movements intact.     Pupils: Pupils are equal, round, and reactive to light.  Cardiovascular:     Rate and Rhythm: Normal rate and regular rhythm.     Pulses: Normal pulses.     Heart sounds: Normal heart sounds. No murmur heard.    No friction rub. No gallop.  Pulmonary:     Effort: Pulmonary effort is normal. No respiratory distress.     Breath sounds: Normal breath sounds.  Abdominal:     General: Abdomen is flat. A surgical scar is present. Bowel sounds are normal. There is no distension.     Palpations: Abdomen is soft.     Tenderness: There is abdominal tenderness in the epigastric area and periumbilical area. There is no guarding or rebound.  Skin:    General: Skin is warm and dry.     Coloration: Skin is not jaundiced.  Neurological:     Mental Status: She is alert. Mental status is at baseline.     Coordination: Coordination normal.     ED Results / Procedures / Treatments   Labs (all labs ordered are listed, but only abnormal results are displayed) Labs Reviewed  COMPREHENSIVE METABOLIC PANEL - Abnormal; Notable for the following components:      Result Value   Glucose, Bld 118 (*)    AST 54 (*)    Total Bilirubin 1.4 (*)    All other components within normal limits  URINALYSIS, ROUTINE W REFLEX MICROSCOPIC - Abnormal; Notable for the following components:   APPearance HAZY (*)    pH 9.0 (*)    Protein, ur 30 (*)    Leukocytes,Ua TRACE (*)    Bacteria, UA RARE  (*)    All other components within normal limits  DIFFERENTIAL - Abnormal; Notable for the following components:   Eosinophils Absolute 0.9 (*)    All other components within normal limits  LIPASE, BLOOD  CBC  POC URINE PREG, ED    EKG None  Radiology No results found.  Procedures Procedures    Medications Ordered in ED Medications  ondansetron (ZOFRAN) injection 4 mg (4 mg Intravenous Given 07/29/22 1249)  sodium chloride 0.9 % bolus 1,000 mL (0 mLs Intravenous Stopped 07/29/22 1352)  dicyclomine (BENTYL) injection 20 mg (20 mg Intramuscular Given 07/29/22 1328)    ED Course/ Medical Decision Making/ A&P                           Medical Decision Making Amount and/or Complexity of Data Reviewed Labs: ordered.  Risk Prescription drug management.   Patient presents due to emesis and upper abdominal pain.  Differential includes but not limited to rupture ectopic pregnancy, gastritis, perforated ulcer, pancreatitis, GERD.  I reviewed patient's external records, status post cholecystectomy so not gallbladder related.  On exam patient has upper abdominal tenderness without any rigidity or guarding.  There is no rebound, abdominal exam is overall very benign and reassuring.  Lungs are clear to auscultation bilaterally, S1-S2.  Patient is well-appearing without any SIRS criteria.  I ordered and reviewed laboratory work-up.  CBC without leukocytosis or anemia, CMP without gross electrolyte derangement or AKI.  Mild elevation of AST and T. bili.  Lipase is unremarkable, not consistent with pancreatitis.  Patient is not pregnant, not a ruptured topic pregnancy.  I ordered fluid bolus, Bentyl.  On reevaluation patient states she is feeling improved.  She is standing up and playing with her child.  She is resting have the IV removed as she is feeling better and would like to go home.  I considered CT scan but reassuring laboratory work-up and benign repeat abdominal exam I do not  think currently indicated.  At this time I feel patient is appropriate for outpatient follow-up.  Repeat abdominal exam is benign.  Advised dicyclomine for [facility], abstaining from alcohol and bland food until feeling improved.  Patient discharged in stable condition.  Return precautions given.  Impression - gastritis/ upper abdominal pain.        Final Clinical Impression(s) / ED Diagnoses Final diagnoses:  Epigastric pain    Rx / DC Orders ED Discharge Orders          Ordered    dicyclomine (BENTYL) 20 MG tablet  2 times daily        07/29/22 1501              Theron Arista, [place] 07/29/22 1503    Lonell Grandchild, MD 07/30/22 [phone]

## 2022-07-29 NOTE — Discharge Instructions (Signed)
you were seen in the emergency department for abdominal pain.  Your work-up today was reassuring, you can drink plenty of fluids at home.  Take the dicyclomine as needed every 12 hours for abdominal cramping.  The pain should resolve on its own, if you have severe pain that becomes constant return to ED for additional evaluation.  Follow-up with your PCP tomorrow for reevaluation.  I think the pain was most likely a reaction to the food or from alcohol yesterday, please abstain from drinking alcohol until your stomach is feeling improved and stick towards bland foods that are less upsetting on the stomach.

## 2022-07-29 NOTE — ED Notes (Signed)
Pt is aware of needing urine

## 2022-08-02 ENCOUNTER — Ambulatory Visit: Payer: Medicaid Other | Admitting: Nurse Practitioner

## 2022-08-24 ENCOUNTER — Ambulatory Visit: Payer: Medicaid Other | Admitting: Nurse Practitioner

## 2022-10-03 ENCOUNTER — Other Ambulatory Visit: Payer: Self-pay | Admitting: Nurse Practitioner

## 2022-10-03 DIAGNOSIS — E038 Other specified hypothyroidism: Secondary | ICD-10-CM

## 2022-10-03 DIAGNOSIS — E039 Hypothyroidism, unspecified: Secondary | ICD-10-CM

## 2022-10-03 LAB — CYTOLOGY - PAP

## 2022-11-04 ENCOUNTER — Ambulatory Visit
Admission: EM | Admit: 2022-11-04 | Discharge: 2022-11-04 | Disposition: A | Discharge status: Home / Self Care | Payer: Medicaid Other | Attending: Family Medicine | Admitting: Family Medicine

## 2022-11-04 DIAGNOSIS — J3089 Other allergic rhinitis: Secondary | ICD-10-CM | POA: Diagnosis not present

## 2022-11-04 DIAGNOSIS — N926 Irregular menstruation, unspecified: Secondary | ICD-10-CM | POA: Diagnosis not present

## 2022-11-04 DIAGNOSIS — Z3202 Encounter for pregnancy test, result negative: Secondary | ICD-10-CM | POA: Diagnosis not present

## 2022-11-04 LAB — POCT URINE PREGNANCY: Preg Test, Ur: NEGATIVE

## 2022-11-04 MED ORDER — CETIRIZINE HCL 10 MG PO TABS: 10.0000 mg | ORAL_TABLET | Freq: Every day | ORAL | 2 refills | Status: DC

## 2022-11-04 MED ORDER — FLUTICASONE PROPIONATE 50 MCG/ACT NA SUSP: 1.0000 | Freq: Two times a day (BID) | NASAL | 2 refills | Status: DC

## 2022-11-04 NOTE — Discharge Instructions (Addendum)
Your allergy medications are safe during breast-feeding, you may also take Sudafed or Mucinex, saline sinus rinses as needed

## 2022-11-04 NOTE — ED Provider Notes (Signed)
[facility]    CSN: [id number] Arrival date & time: 11/04/22  0831      History   Chief Complaint No chief complaint on file.   HPI Kathleen Grimes is a 21 y.o. female.   Patient presenting today with almost a week of nasal congestion, postnasal drip, scratchy throat.  Denies fever, chills, chest pain, shortness of breath, abdominal pain, nausea vomiting or diarrhea.  Not taking anything over-the-counter for symptoms as she is breast-feeding and is unsure what she is allowed to take.  Does have a history of seasonal allergies not currently on the regimen for the same reason.  No known sick contacts recently.  She is also concerned that she might be having a miscarriage.  Has had heavy vaginal bleeding with clots and tissue for about a week now.  She never got a positive pregnancy test prior to the bleeding but was told by the health department last week that she potentially could have been pregnant and having a miscarriage now.  She did have unprotected sex last month toward the beginning of the month and then got on Depo injections at the end of the month.    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   Elevated LFTs 04/26/2020   Hx of cholecystectomy 04/25/2020   Calculus of gallbladder without cholecystitis without obstruction 04/06/2020   Abdominal migraine 02/22/2015   Cyclical vomiting 02/22/2015   Allergic rhinitis 03/13/2013    Past Surgical History:  Procedure Laterality Date   CHOLECYSTECTOMY N/A 04/13/2020   Procedure: LAPAROSCOPIC CHOLECYSTECTOMY;  Surgeon: Lucretia Roers, MD;  Location: [facility];  Service: General;  Laterality: N/A;   CHOLECYSTECTOMY     CHOLECYSTECTOMY     WISDOM TOOTH EXTRACTION       OB History     Gravida  1   Para  1   Term  1   Preterm  0   AB  0   Living  1      SAB  0   IAB  0   Ectopic  0   Multiple  0   Live Births  1            Home Medications    Prior to Admission medications   Medication Sig Start Date End Date Taking? Authorizing Provider  cetirizine (ZYRTEC ALLERGY) 10 MG tablet Take 1 tablet (10 mg total) by mouth daily. 11/04/22  Yes Particia Nearing, PA-C  fluticasone [facility]) 50 MCG/ACT nasal spray Place 1 spray into both nostrils 2 (two) times daily. 11/04/22  Yes Particia Nearing, PA-C  acetaminophen (TYLENOL) 500 MG tablet Take 500 mg by mouth every 6 (six) hours as needed.    [provider]  amoxicillin (AMOXIL) 875 MG tablet Take 1 tablet (875 mg total) by mouth 2 (two) times daily. 05/12/22   Particia Nearing, PA-C  dicyclomine (BENTYL) 20 MG tablet Take 1 tablet (20 mg total) by mouth 2 (two) times daily. 07/29/22   Theron Arista, PA-C  levothyroxine (SYNTHROID) 75 MCG tablet Take 1 tablet (75 mcg total) by mouth daily before breakfast. 11/30/21   Dani Gobble, NP  lidocaine (XYLOCAINE) 2 % solution Use as directed 10 mLs  in the mouth or throat every 3 (three) hours as needed for mouth pain. 05/12/22   Particia Nearing, PA-C    Family History Family History  Problem Relation Age of Onset   Cancer Father    Hypertension Father    Hyperlipidemia Father    Heart attack Father    Breast cancer Paternal Grandmother    Cancer Maternal Great-grandmother        bone marrow   Colon cancer Neg Hx    Esophageal cancer Neg Hx    Stomach cancer Neg Hx     Social History Social History   Tobacco Use   Smoking status: Never   Smokeless tobacco: Never  Vaping Use   Vaping Use: Never used  Substance Use Topics   Alcohol use: Yes    Comment: 1-2 drinks a month   Drug use: Not Currently     Allergies   Hydrocodone and Hydrocodone   Review of Systems Review of  Systems Per HPI  Physical Exam Triage Vital Signs ED Triage Vitals  Enc Vitals Group     BP 11/04/22 0840 103/67     Pulse Rate 11/04/22 0840 98     Resp 11/04/22 0840 18     Temp 11/04/22 0840 98.1 F (36.7 C)     Temp Source 11/04/22 0840 Oral     SpO2 11/04/22 0840 96 %     Weight --      Height --      Head Circumference --      Peak Flow --      Pain Score 11/04/22 0845 0     Pain Loc --      Pain Edu? --      Excl. in [place]? --    No data found.  Updated Vital Signs BP 103/67 (BP Location: Right Arm)   Pulse 98   Temp 98.1 F (36.7 C) (Oral)   Resp 18   LMP 10/28/2022   SpO2 96%   Breastfeeding Yes   Visual Acuity Right Eye Distance:   Left Eye Distance:   Bilateral Distance:    Right Eye Near:   Left Eye Near:    Bilateral Near:     Physical Exam Vitals and nursing note reviewed.  Constitutional:      Appearance: Normal appearance. She is not ill-appearing.  HENT:     Head: Atraumatic.     Right Ear: Tympanic membrane normal.     Left Ear: Tympanic membrane normal.     Nose: Rhinorrhea present.     Mouth/Throat:     Mouth: Mucous membranes are moist.     Pharynx: Posterior oropharyngeal erythema present. No oropharyngeal exudate.  Eyes:     Extraocular Movements: Extraocular movements intact.     Conjunctiva/sclera: Conjunctivae normal.  Cardiovascular:     Rate and Rhythm: Normal rate and regular rhythm.     Heart sounds: Normal heart sounds.  Pulmonary:     Effort: Pulmonary effort is normal.     Breath sounds: Normal breath sounds. No wheezing or rales.  Abdominal:     General: Bowel sounds are normal. There is no distension.     Palpations: Abdomen is soft.     Tenderness: There is no abdominal tenderness. There is no guarding.  Musculoskeletal:        General: Normal range of motion.     Cervical back: Normal range of motion and neck supple.  Skin:    General: Skin is warm and  dry.  Neurological:     Mental Status: She is alert and  oriented to person, place, and time.     Motor: No weakness.     Gait: Gait normal.  Psychiatric:        Mood and Affect: Mood normal.        Thought Content: Thought content normal.        Judgment: Judgment normal.      UC Treatments / Results  Labs (all labs ordered are listed, but only abnormal results are displayed) Labs Reviewed  POCT URINE PREGNANCY    EKG   Radiology No results found.  Procedures Procedures (including critical care time)  Medications Ordered in UC Medications - No data to display  Initial Impression / Assessment and Plan / UC Course  I have reviewed the triage vital signs and the nursing notes.  Pertinent labs & imaging results that were available during my care of the patient were reviewed by me and considered in my medical decision making (see chart for details).     Vital signs and exam overall reassuring today, suspect seasonal allergy exacerbation so we will restart allergy regimen as it is safe for breast-feeding and give her a list of things that are safe to take during breast-feeding.  Sinus rinses, humidifier recommended additionally.  Regarding her abnormal menses, suspect this is related to getting on Depo injections several weeks ago, negative pregnancy test here and negative home pregnancy test the past several weeks.  Did discuss if bleeding continued to follow-up with OB/GYN for further evaluation and possibly ultrasound imaging.  Reassured her that she is not currently pregnant.  Final Clinical Impressions(s) / UC Diagnoses   Final diagnoses:  Abnormal menses  Seasonal allergic rhinitis due to other allergic trigger  Negative pregnancy test     Discharge Instructions      Your allergy medications are safe during breast-feeding, you may also take Sudafed or Mucinex, saline sinus rinses as needed    ED Prescriptions     Medication Sig Dispense Auth. Provider   fluticasone (FLONASE) 50 MCG/ACT nasal spray Place 1 spray  into both nostrils 2 (two) times daily. 16 g Particia Nearing, [place]   cetirizine (ZYRTEC ALLERGY) 10 MG tablet Take 1 tablet (10 mg total) by mouth daily. 30 tablet Particia Nearing, [place]      PDMP not reviewed this encounter.   Roosvelt Maser [place], [place] 11/04/22 [phone]

## 2022-11-04 NOTE — ED Triage Notes (Signed)
Pt reports nasal congestion, its green and it wont go away. Didn't take anything because she breastfeeds.     Pt is concerned she is having a miscarriage. But isn't sure she really is pregnant

## 2023-01-09 ENCOUNTER — Encounter: Payer: Self-pay | Admitting: Emergency Medicine

## 2023-01-09 ENCOUNTER — Ambulatory Visit
Admission: EM | Admit: 2023-01-09 | Discharge: 2023-01-09 | Disposition: A | Discharge status: Home / Self Care | Payer: Medicaid Other | Attending: Family Medicine | Admitting: Family Medicine

## 2023-01-09 ENCOUNTER — Other Ambulatory Visit: Payer: Self-pay

## 2023-01-09 DIAGNOSIS — E039 Hypothyroidism, unspecified: Secondary | ICD-10-CM

## 2023-01-09 MED ORDER — LEVOTHYROXINE SODIUM 75 MCG PO TABS
75.0000 ug | ORAL_TABLET | Freq: Every day | ORAL | 0 refills | Status: DC
Start: 2023-01-09 — End: 2023-05-03

## 2023-01-09 NOTE — ED Triage Notes (Signed)
Pt reports has been off levothyroxine x2 months. Pt reports reached out to endocrinologist and reports needs to have labs drawn before can see this provider again and needs to be taking medication for 6 weeks before blood draw.

## 2023-01-09 NOTE — ED Provider Notes (Signed)
[facility]   [id number] 01/09/23 Arrival Time: 3790  ASSESSMENT & PLAN:  1. Hypothyroidism, unspecified type    Meds ordered this encounter  Medications   levothyroxine (SYNTHROID) 75 MCG tablet    Sig: Take 1 tablet (75 mcg total) by mouth daily before breakfast.    Dispense:  90 tablet    Refill:  0   Will schedule f/u with endocrine to draw TSH. Declines blood draw today.   Follow-up Information     Schedule an appointment as soon as possible for a visit  with Raiford Simmonds., PA-C.   Contact information: [address] [place] [address] [phone]                 Reviewed expectations re: course of current medical issues. Questions answered. Outlined signs and symptoms indicating need for more acute intervention. Understanding verbalized. After Visit Summary given.   SUBJECTIVE: History from: Patient. Kathleen Grimes is a 23 y.o. female. Pt reports has been off levothyroxine x2 months. Pt reports reached out to endocrinologist and reports needs to have labs drawn before can see this provider again and needs to be taking medication for 6 weeks before blood draw.     OBJECTIVE:  Vitals:   01/09/23 1004  BP: 108/73  Pulse: 76  Resp: 20  Temp: 98.5 F (36.9 C)  TempSrc: Oral  SpO2: 95%    General appearance: alert; no distress Eyes: PERRLA; EOMI; conjunctiva normal Lungs: speaks full sentences without difficulty; unlabored Extremities: no edema Skin: warm and dry Neurologic: normal gait Psychological: alert and cooperative; normal mood and affect   Allergies  Allergen Reactions   Hydrocodone Nausea Only   Hydrocodone Nausea And Vomiting    Past Medical History:  Diagnosis Date   Abdominal migraine    Allergic rhinitis 03/13/2013   Allergy    Anemia    Hypothyroidism    Social History   Socioeconomic History   Marital status: Single    Spouse name: Not on file   Number of children: 1   Years of  education: Not on file   Highest education level: Not on file  Occupational History   Not on file  Tobacco Use   Smoking status: Never   Smokeless tobacco: Never  Vaping Use   Vaping Use: Never used  Substance and Sexual Activity   Alcohol use: Yes    Comment: 1-2 drinks a month   Drug use: Not Currently   Sexual activity: Not Currently    Birth control/protection: None  Other Topics Concern   Not on file  Social History Narrative   ** Merged History Encounter **       Social Determinants of Health   Financial Resource Strain: Medium Risk (03/29/2021)   Overall Financial Resource Strain (CARDIA)    Difficulty of Paying Living Expenses: Somewhat hard  Food Insecurity: No Food Insecurity (03/29/2021)   Hunger Vital Sign    Worried About Running Out of Food in the Last Year: Never true    Ran Out of Food in the Last Year: Never true  Transportation Needs: No Transportation Needs (03/29/2021)   PRAPARE - Hydrologist (Medical): No    Lack of Transportation (Non-Medical): No  Physical Activity: Sufficiently Active (03/29/2021)   Exercise Vital Sign    Days of Exercise per Week: 4 days    Minutes of Exercise per Session: 40 min  Stress: No Stress Concern Present (03/29/2021)  Parker Questionnaire    Feeling of Stress : Only a little  Social Connections: Socially Isolated (03/29/2021)   Social Connection and Isolation Panel [NHANES]    Frequency of Communication with Friends and Family: More than three times a week    Frequency of Social Gatherings with Friends and Family: Twice a week    Attends Religious Services: Never    Marine scientist or Organizations: No    Attends Archivist Meetings: Never    Marital Status: Never married  Intimate Partner Violence: Not At Risk (03/29/2021)   Humiliation, Afraid, Rape, and Kick questionnaire    Fear of Current or Ex-Partner: No     Emotionally Abused: No    Physically Abused: No    Sexually Abused: No   Family History  Problem Relation Age of Onset   Cancer Father    Hypertension Father    Hyperlipidemia Father    Heart attack Father    Breast cancer Paternal Grandmother    Cancer Maternal Great-grandmother        bone marrow   Colon cancer Neg Hx    Esophageal cancer Neg Hx    Stomach cancer Neg Hx    Past Surgical History:  Procedure Laterality Date   CHOLECYSTECTOMY N/A 04/13/2020   Procedure: LAPAROSCOPIC CHOLECYSTECTOMY;  Surgeon: Virl Cagey, MD;  Location: [facility];  Service: General;  Laterality: N/A;   CHOLECYSTECTOMY     CHOLECYSTECTOMY     WISDOM TOOTH EXTRACTION       Vanessa Kick, MD 01/09/23 1131

## 2023-01-30 ENCOUNTER — Ambulatory Visit
Admission: EM | Admit: 2023-01-30 | Discharge: 2023-01-30 | Disposition: A | Discharge status: Home / Self Care | Payer: Medicaid Other | Attending: Family Medicine | Admitting: Family Medicine

## 2023-01-30 DIAGNOSIS — M25531 Pain in right wrist: Secondary | ICD-10-CM

## 2023-01-30 MED ORDER — PREDNISONE 20 MG PO TABS: 40.0000 mg | ORAL_TABLET | Freq: Every day | ORAL | 0 refills | Status: DC

## 2023-01-30 NOTE — ED Provider Notes (Signed)
[facility]    CSN: [id number] Arrival date & time: 01/30/23  1201      History   Chief Complaint No chief complaint on file.   HPI Kathleen Grimes is a 22 y.o. female.   Patient presenting today with about a week of progressively worsening pain to the base of thumb down into wrist mainly with movement.  States she has to grab squeeze bottles of condiments at work and has to pick up her son often and these motions seem to make it significantly worse and the hand feels weak at times.  Denies numbness, tingling, swelling, discoloration, injury to the area, loss of range of motion.  Tried some ibuprofen this morning with minimal relief.   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   Elevated LFTs 04/26/2020   Hx of cholecystectomy 04/25/2020   Calculus of gallbladder without cholecystitis without obstruction 04/06/2020   Abdominal migraine [id number]   Cyclical vomiting [id number]   Allergic rhinitis 03/13/2013    Past Surgical History:  Procedure Laterality Date   CHOLECYSTECTOMY N/A 04/13/2020   Procedure: LAPAROSCOPIC CHOLECYSTECTOMY;  Surgeon: Virl Cagey, MD;  Location: [facility];  Service: General;  Laterality: N/A;   CHOLECYSTECTOMY     CHOLECYSTECTOMY     WISDOM TOOTH EXTRACTION      OB History     Gravida  1   Para  1   Term  1   Preterm  0   AB  0   Living  1      SAB  0   IAB  0   Ectopic  0   Multiple  0   Live Births  1            Home Medications    Prior to Admission medications   Medication Sig Start Date End Date Taking? Authorizing Provider  predniSONE (DELTASONE) 20 MG tablet Take 2 tablets (40 mg total) by mouth daily with breakfast. 01/30/23  Yes Volney American, PA-C  levothyroxine (SYNTHROID) 75 MCG tablet Take 1 tablet (75 mcg total) by mouth daily before breakfast. 01/09/23   Vanessa Kick, MD    Family History Family History  Problem Relation Age of Onset   Cancer Father    Hypertension Father    Hyperlipidemia Father    Heart attack Father    Breast cancer Paternal Grandmother    Cancer Maternal Great-grandmother        bone marrow   Colon cancer Neg Hx    Esophageal cancer Neg Hx    Stomach cancer Neg Hx     Social History Social History   Tobacco Use   Smoking status: Never   Smokeless tobacco: Never  Vaping Use   Vaping Use: Never used  Substance Use Topics   Alcohol use: Yes    Comment: 1-2 drinks a month   Drug use: Not Currently     Allergies   Hydrocodone and Hydrocodone   Review of Systems Review of Systems PER HPI  Physical Exam Triage Vital Signs ED Triage Vitals  Enc Vitals Group     BP 01/30/23 1306 105/70     Pulse Rate 01/30/23 1306 61     Resp  01/30/23 1306 20     Temp 01/30/23 1306 98.1 F (36.7 C)     Temp Source 01/30/23 1306 Oral     SpO2 01/30/23 1306 97 %     Weight --      Height --      Head Circumference --      Peak Flow --      Pain Score 01/30/23 1307 7     Pain Loc --      Pain Edu? --      Excl. in [place]? --    No data found.  Updated Vital Signs BP 105/70 (BP Location: Right Arm)   Pulse 61   Temp 98.1 F (36.7 C) (Oral)   Resp 20   LMP 01/30/2023 (Approximate) Comment: pt reports came off [facility] in january and reports "body is still adjusting."  SpO2 97%   Visual Acuity Right Eye Distance:   Left Eye Distance:   Bilateral Distance:    Right Eye Near:   Left Eye Near:    Bilateral Near:     Physical Exam Vitals and nursing note reviewed.  Constitutional:      Appearance: Normal appearance. She is not ill-appearing.  HENT:     Head: Atraumatic.  Eyes:     Extraocular Movements: Extraocular movements intact.     Conjunctiva/sclera: Conjunctivae  normal.  Cardiovascular:     Rate and Rhythm: Normal rate and regular rhythm.     Heart sounds: Normal heart sounds.  Pulmonary:     Effort: Pulmonary effort is normal.     Breath sounds: Normal breath sounds.  Musculoskeletal:        General: Tenderness present. No swelling, deformity or signs of injury. Normal range of motion.     Cervical back: Normal range of motion and neck supple.     Comments: Minimally tender to palpation to the base of right thumb and to right radial wrist, range of motion intact  Skin:    General: Skin is warm and dry.     Findings: No bruising or erythema.  Neurological:     Mental Status: She is alert and oriented to person, place, and time.     Comments: Right hand neurovascularly intact  Psychiatric:        Mood and Affect: Mood normal.        Thought Content: Thought content normal.        Judgment: Judgment normal.      UC Treatments / Results  Labs (all labs ordered are listed, but only abnormal results are displayed) Labs Reviewed - No data to display  EKG   Radiology No results found.  Procedures Procedures (including critical care time)  Medications Ordered in UC Medications - No data to display  Initial Impression / Assessment and Plan / UC Course  I have reviewed the triage vital signs and the nursing notes.  Pertinent labs & imaging results that were available during my care of the patient were reviewed by me and considered in my medical decision making (see chart for details).     Suspected tendinitis/strain type etiology.  Given poor response to ibuprofen, will treat with very short course of prednisone, Epsom salt soaks, massage, wrist compression braces.  Return for worsening symptoms.  Final Clinical Impressions(s) / UC Diagnoses   Final diagnoses:  Right wrist pain     Discharge Instructions      I recommend warm Epsom salt soaks, stretches, massage to the area and wearing a  compression wrist wrap to help  support the area while it is healing.  Avoid strenuous activities with this area that may strain it further.  I have sent over a short course of steroids to see if this will help with the inflammation to calm down.    ED Prescriptions     Medication Sig Dispense Auth. Provider   predniSONE (DELTASONE) 20 MG tablet Take 2 tablets (40 mg total) by mouth daily with breakfast. 6 tablet Volney American, [place]      PDMP not reviewed this encounter.   Volney American, [place] 01/30/23 1334

## 2023-01-30 NOTE — Discharge Instructions (Signed)
I recommend warm Epsom salt soaks, stretches, massage to the area and wearing a compression wrist wrap to help support the area while it is healing.  Avoid strenuous activities with this area that may strain it further.  I have sent over a short course of steroids to see if this will help with the inflammation to calm down.

## 2023-01-30 NOTE — ED Triage Notes (Signed)
Pt reports her the right palm of her hand under her thumb down to her wrist hurts x 1 week.  Hurts worst with ROM

## 2023-02-18 ENCOUNTER — Other Ambulatory Visit: Payer: Self-pay

## 2023-02-18 ENCOUNTER — Encounter (HOSPITAL_COMMUNITY): Payer: Self-pay | Admitting: Radiology

## 2023-02-18 ENCOUNTER — Telehealth: Payer: Medicaid Other | Admitting: Physician Assistant

## 2023-02-18 ENCOUNTER — Emergency Department (HOSPITAL_COMMUNITY)
Admission: EM | Admit: 2023-02-18 | Discharge: 2023-02-18 | Disposition: A | Discharge status: Home / Self Care | Payer: Medicaid Other | Attending: Emergency Medicine | Admitting: Emergency Medicine

## 2023-02-18 DIAGNOSIS — N3001 Acute cystitis with hematuria: Secondary | ICD-10-CM | POA: Insufficient documentation

## 2023-02-18 DIAGNOSIS — Z1152 Encounter for screening for COVID-19: Secondary | ICD-10-CM | POA: Insufficient documentation

## 2023-02-18 DIAGNOSIS — Z79890 Hormone replacement therapy (postmenopausal): Secondary | ICD-10-CM | POA: Insufficient documentation

## 2023-02-18 DIAGNOSIS — A084 Viral intestinal infection, unspecified: Secondary | ICD-10-CM

## 2023-02-18 DIAGNOSIS — R197 Diarrhea, unspecified: Secondary | ICD-10-CM | POA: Diagnosis not present

## 2023-02-18 DIAGNOSIS — E039 Hypothyroidism, unspecified: Secondary | ICD-10-CM | POA: Insufficient documentation

## 2023-02-18 DIAGNOSIS — R112 Nausea with vomiting, unspecified: Secondary | ICD-10-CM | POA: Insufficient documentation

## 2023-02-18 LAB — URINALYSIS, ROUTINE W REFLEX MICROSCOPIC
Bilirubin Urine: NEGATIVE
Glucose, UA: NEGATIVE mg/dL
Hgb urine dipstick: NEGATIVE
Ketones, ur: NEGATIVE mg/dL
Leukocytes,Ua: NEGATIVE
Nitrite: NEGATIVE
Protein, ur: 100 mg/dL — AB
RBC / HPF: >50 RBC/hpf (ref 0–5)
Specific Gravity, Urine: 1.032 — ABNORMAL HIGH (ref 1.005–1.030)
pH: 5 (ref 5.0–8.0)

## 2023-02-18 LAB — RESP PANEL BY RT-PCR (RSV, FLU A&B, COVID)  RVPGX2
Influenza A by PCR: NEGATIVE
Influenza B by PCR: NEGATIVE
Resp Syncytial Virus by PCR: NEGATIVE
SARS Coronavirus 2 by RT PCR: NEGATIVE

## 2023-02-18 LAB — CBC
HCT: 46.9 % — ABNORMAL HIGH (ref 36.0–46.0)
Hemoglobin: 16.3 g/dL — ABNORMAL HIGH (ref 12.0–15.0)
MCH: 32.3 pg (ref 26.0–34.0)
MCHC: 34.8 g/dL (ref 30.0–36.0)
MCV: 92.9 fL (ref 80.0–100.0)
Platelets: 177 10*3/uL (ref 150–400)
RBC: 5.05 MIL/uL (ref 3.87–5.11)
RDW: 12.6 % (ref 11.5–15.5)
WBC: 9.2 10*3/uL (ref 4.0–10.5)
nRBC: 0 % (ref 0.0–0.2)

## 2023-02-18 LAB — COMPREHENSIVE METABOLIC PANEL
ALT: 38 U/L (ref 0–44)
AST: 34 U/L (ref 15–41)
Albumin: 4.8 g/dL (ref 3.5–5.0)
Alkaline Phosphatase: 44 U/L (ref 38–126)
Anion gap: 11 (ref 5–15)
BUN: 18 mg/dL (ref 6–20)
CO2: 20 mmol/L — ABNORMAL LOW (ref 22–32)
Calcium: 9.1 mg/dL (ref 8.9–10.3)
Chloride: 104 mmol/L (ref 98–111)
Creatinine, Ser: 0.74 mg/dL (ref 0.44–1.00)
GFR, Estimated: >60 mL/min (ref >60)
Glucose, Bld: 146 mg/dL — ABNORMAL HIGH (ref 70–99)
Potassium: 3.6 mmol/L (ref 3.5–5.1)
Sodium: 135 mmol/L (ref 135–145)
Total Bilirubin: 2.2 mg/dL — ABNORMAL HIGH (ref 0.3–1.2)
Total Protein: 8.2 g/dL — ABNORMAL HIGH (ref 6.5–8.1)

## 2023-02-18 LAB — LIPASE, BLOOD: Lipase: 20 U/L (ref 11–51)

## 2023-02-18 LAB — POC URINE PREG, ED: Preg Test, Ur: NEGATIVE

## 2023-02-18 MED ORDER — CEPHALEXIN 500 MG PO CAPS
500.0000 mg | ORAL_CAPSULE | Freq: Once | ORAL | Status: AC
  Administered 2023-02-18: 500 mg via ORAL
  Filled 2023-02-18: qty 1

## 2023-02-18 MED ORDER — ONDANSETRON 4 MG PO TBDP
4.0000 mg | ORAL_TABLET | Freq: Once | ORAL | Status: AC
  Administered 2023-02-18: 4 mg via ORAL
  Filled 2023-02-18: qty 1

## 2023-02-18 MED ORDER — ONDANSETRON HCL 4 MG/2ML IJ SOLN
4.0000 mg | Freq: Once | INTRAMUSCULAR | Status: AC | PRN
  Administered 2023-02-18: 4 mg via INTRAVENOUS
  Filled 2023-02-18: qty 2

## 2023-02-18 MED ORDER — LOPERAMIDE HCL 2 MG PO CAPS
4.0000 mg | ORAL_CAPSULE | Freq: Once | ORAL | Status: AC
  Administered 2023-02-18: 4 mg via ORAL
  Filled 2023-02-18: qty 2

## 2023-02-18 MED ORDER — SODIUM CHLORIDE 0.9 % IV BOLUS
1000.0000 mL | Freq: Once | INTRAVENOUS | Status: AC
  Administered 2023-02-18: 1000 mL via INTRAVENOUS

## 2023-02-18 MED ORDER — ONDANSETRON 4 MG PO TBDP: 4.0000 mg | ORAL_TABLET | Freq: Three times a day (TID) | ORAL | 0 refills | Status: DC | PRN

## 2023-02-18 MED ORDER — LOPERAMIDE HCL 2 MG PO CAPS: 2.0000 mg | ORAL_CAPSULE | Freq: Four times a day (QID) | ORAL | 0 refills | Status: DC | PRN

## 2023-02-18 MED ORDER — CEPHALEXIN 500 MG PO CAPS: 500.0000 mg | ORAL_CAPSULE | Freq: Two times a day (BID) | ORAL | 0 refills | Status: AC

## 2023-02-18 MED ORDER — ONDANSETRON HCL 4 MG PO TABS: 4.0000 mg | ORAL_TABLET | Freq: Four times a day (QID) | ORAL | 0 refills | Status: DC

## 2023-02-18 NOTE — Progress Notes (Signed)
We are sorry that you are not feeling well.  Here is how we plan to help!  Based on what you have shared with me it looks like you have Acute Infectious Diarrhea.  Most cases of acute diarrhea are due to infections with virus and bacteria and are self-limited conditions lasting less than 14 days.  For your symptoms you may take Imodium 2 mg tablets that are over the counter at your local pharmacy. Take two tablet now and then one after each loose stool up to 6 a day.  Antibiotics are not needed for most people with diarrhea.  Optional: Zofran 4 mg 1 tablet every 8 hours as needed for nausea and vomiting   HOME CARE We recommend changing your diet to help with your symptoms for the next few days. Drink plenty of fluids that contain water salt and sugar. Sports drinks such as Gatorade may help.  You may try broths, soups, bananas, applesauce, soft breads, mashed potatoes or crackers.  You are considered infectious for as long as the diarrhea continues. Hand washing or use of alcohol based hand sanitizers is recommend. It is best to stay out of work or school until your symptoms stop.   GET HELP RIGHT AWAY If you have dark yellow colored urine or do not pass urine frequently you should drink more fluids.   If your symptoms worsen  If you feel like you are going to pass out (faint) You have a new problem  MAKE SURE YOU  Understand these instructions. Will watch your condition. Will get help right away if you are not doing well or get worse.  Thank you for choosing an e-visit.  Your e-visit answers were reviewed by a board certified advanced clinical practitioner to complete your personal care plan. Depending upon the condition, your plan could have included both over the counter or prescription medications.  Please review your pharmacy choice. Make sure the pharmacy is open so you can pick up prescription now. If there is a problem, you may contact your provider through [employer] and have the prescription routed to another pharmacy.  Your safety is important to [name]. If you have drug allergies check your prescription carefully.   For the next 24 hours you can use MyChart to ask questions about today's visit, request a non-urgent call back, or ask for a work or school excuse. You will get an email in the next two days asking about your experience. I hope that your e-visit has been valuable and will speed your recovery.  I have spent 5 minutes in review of e-visit questionnaire, review and updating patient chart, medical decision making and response to patient.   Mar Daring, PA-C

## 2023-02-18 NOTE — ED Notes (Signed)
Pt sleeping without distress.  No vomiting or c/o nausea

## 2023-02-18 NOTE — ED Triage Notes (Signed)
Pt states vomiting and diarrhea since 9am. Pt did an E visit and was given zofran and something for diarrhea but nothing has helped. Last took zofran at 1pm.

## 2023-02-18 NOTE — Discharge Instructions (Addendum)
Take your next dose of the antibiotic tomorrow morning.  You are also being prescribed additional nausea medication, Imodium to take as well if your diarrhea persists.  Make sure you are drinking plenty of fluids to stay hydrated.    Return here if you develop any worsened symptoms.

## 2023-02-18 NOTE — ED Notes (Signed)
Pt resting.  Drinking ginger ale and water without any nausea

## 2023-02-18 NOTE — ED Notes (Signed)
Patient denies pain and is resting comfortably.  

## 2023-02-18 NOTE — ED Provider Notes (Signed)
[facility] Provider Note   CSN: [id number] Arrival date & time: 02/18/23  1719     History {Add pertinent medical, surgical, social history, OB history to HPI:1} Chief Complaint  Patient presents with  . Emesis  . Diarrhea    Kathleen Grimes is a 22 y.o. female with a history of hypothyroidism, history of abdominal migraines, surgical history significant for cholecystectomy presenting with a 1 day history of too numerous to count episodes of nonbloody vomiting and diarrhea, the vomiting resolved after being given a dose of Zofran after arriving here.  She woke with her symptoms around 9 AM today.  She denies fevers, chest pain, shortness of breath, she has had no URI symptoms including nasal congestion, discharge, sore throat.  She endorses aching across her lower back, denies dysuria, increased urinary frequency or urgency.  She has had no treatment prior to arrival.  No known exposures to others with similar symptoms.  The history is provided by the patient.       Home Medications Prior to Admission medications   Medication Sig Start Date End Date Taking? Authorizing Provider  levothyroxine (SYNTHROID) 75 MCG tablet Take 1 tablet (75 mcg total) by mouth daily before breakfast. 01/09/23   Vanessa Kick, MD  ondansetron (ZOFRAN-ODT) 4 MG disintegrating tablet Take 1 tablet (4 mg total) by mouth every 8 (eight) hours as needed. 02/18/23   Mar Daring, PA-C  predniSONE (DELTASONE) 20 MG tablet Take 2 tablets (40 mg total) by mouth daily with breakfast. 01/30/23   Volney American, PA-C      Allergies    Hydrocodone and Hydrocodone    Review of Systems   Review of Systems  Physical Exam Updated Vital Signs BP (!) 110/99   Pulse 98   Temp 98.9 F (37.2 C) (Temporal)   Resp 18   Ht '5\' 7"'$  (1.702 m)   Wt 61.2 kg   LMP 01/30/2023 (Approximate) Comment: pt reports came off [facility] in january and reports "body is still adjusting."   SpO2 100%   BMI 21.14 kg/m  Physical Exam  ED Results / Procedures / Treatments   Labs (all labs ordered are listed, but only abnormal results are displayed) Labs Reviewed  COMPREHENSIVE METABOLIC PANEL - Abnormal; Notable for the following components:      Result Value   CO2 20 (*)    Glucose, Bld 146 (*)    Total Protein 8.2 (*)    Total Bilirubin 2.2 (*)    All other components within normal limits  CBC - Abnormal; Notable for the following components:   Hemoglobin 16.3 (*)    HCT 46.9 (*)    All other components within normal limits  URINALYSIS, ROUTINE W REFLEX MICROSCOPIC - Abnormal; Notable for the following components:   Color, Urine RED (*)    APPearance TURBID (*)    Specific Gravity, Urine 1.032 (*)    Protein, ur 100 (*)    Bacteria, UA MANY (*)    All other components within normal limits  RESP PANEL BY RT-PCR (RSV, FLU A&B, COVID)  RVPGX2  LIPASE, BLOOD  POC URINE PREG, ED    EKG None  Radiology No results found.  Procedures Procedures  {Document cardiac monitor, telemetry assessment procedure when appropriate:1}  Medications Ordered in ED Medications  sodium chloride 0.9 % bolus 1,000 mL (has no administration in time range)  ondansetron (ZOFRAN) injection 4 mg (4 mg Intravenous Given 02/18/23 1954)  ED Course/ Medical Decision Making/ A&P   {   Click here for ABCD2, HEART and other calculatorsREFRESH Note before signing :1}                          Medical Decision Making Amount and/or Complexity of Data Reviewed Labs: ordered.  Risk Prescription drug management.   ***  {Document critical care time when appropriate:1} {Document review of labs and clinical decision tools ie heart score, Chads2Vasc2 etc:1}  {Document your independent review of radiology images, and any outside records:1} {Document your discussion with family members, caretakers, and with consultants:1} {Document social determinants of health affecting pt's  care:1} {Document your decision making why or why not admission, treatments were needed:1} Final Clinical Impression(s) / ED Diagnoses Final diagnoses:  None    Rx / DC Orders ED Discharge Orders     None

## 2023-02-20 LAB — URINE CULTURE: Culture: NO GROWTH

## 2023-02-22 ENCOUNTER — Telehealth: Payer: Medicaid Other | Admitting: Physician Assistant

## 2023-02-22 DIAGNOSIS — H109 Unspecified conjunctivitis: Secondary | ICD-10-CM

## 2023-02-22 MED ORDER — OFLOXACIN 0.3 % OP SOLN: 1.0000 [drp] | Freq: Four times a day (QID) | OPHTHALMIC | 0 refills | Status: DC

## 2023-02-22 NOTE — Progress Notes (Signed)
E-Visit for Pink Eye   We are sorry that you are not feeling well.  Here is how we plan to help!  Based on what you have shared with me it looks like you have conjunctivitis.  Conjunctivitis is a common inflammatory or infectious condition of the eye that is often referred to as "pink eye".  In most cases it is contagious (viral or bacterial). However, not all conjunctivitis requires antibiotics (ex. Allergic).  We have made appropriate suggestions for you based upon your presentation.  I have prescribed Oflaxacin 1-2 drops 4 times a day times 5 days   Pink eye can be highly contagious.  It is typically spread through direct contact with secretions, or contaminated objects or surfaces that one may have touched.  Strict handwashing is suggested with soap and water is urged.  If not available, use alcohol based had sanitizer.  Avoid unnecessary touching of the eye.  If you wear contact lenses, you will need to refrain from wearing them until you see no white discharge from the eye for at least 24 hours after being on medication.  You should see symptom improvement in 1-2 days after starting the medication regimen.  Call us if symptoms are not improved in 1-2 days.  Home Care: Wash your hands often! Do not wear your contacts until you complete your treatment plan. Avoid sharing towels, bed linen, personal items with a person who has pink eye. See attention for anyone in your home with similar symptoms.  Get Help Right Away If: Your symptoms do not improve. You develop blurred or loss of vision. Your symptoms worsen (increased discharge, pain or redness)   Thank you for choosing an e-visit.  Your e-visit answers were reviewed by a board certified advanced clinical practitioner to complete your personal care plan. Depending upon the condition, your plan could have included both over the counter or prescription medications.  Please review your pharmacy choice. Make sure the pharmacy is open so  you can pick up prescription now. If there is a problem, you may contact your provider through MyChart messaging and have the prescription routed to another pharmacy.  Your safety is important to us. If you have drug allergies check your prescription carefully.   For the next 24 hours you can use MyChart to ask questions about today's visit, request a non-urgent call back, or ask for a work or school excuse. You will get an email in the next two days asking about your experience. I hope that your e-visit has been valuable and will speed your recovery.   I have spent 5 minutes in review of e-visit questionnaire, review and updating patient chart, medical decision making and response to patient.   Makenzie Vittorio M Skylah Delauter, PA-C  

## 2023-03-07 ENCOUNTER — Encounter: Payer: Self-pay | Admitting: Emergency Medicine

## 2023-03-07 ENCOUNTER — Ambulatory Visit
Admission: EM | Admit: 2023-03-07 | Discharge: 2023-03-07 | Disposition: A | Discharge status: Home / Self Care | Payer: Medicaid Other | Attending: Nurse Practitioner | Admitting: Nurse Practitioner

## 2023-03-07 DIAGNOSIS — L209 Atopic dermatitis, unspecified: Secondary | ICD-10-CM

## 2023-03-07 MED ORDER — TRIAMCINOLONE ACETONIDE 0.1 % EX OINT: 1.0000 | TOPICAL_OINTMENT | Freq: Two times a day (BID) | CUTANEOUS | 0 refills | Status: DC

## 2023-03-07 NOTE — ED Provider Notes (Signed)
[facility]    CSN: [id number] Arrival date & time: 03/07/23  1635      History   Chief Complaint No chief complaint on file.   HPI Kathleen Grimes is a 22 y.o. female.   Patient presents today for rash to bilateral lower extremities and upper extremities that has been present for the past 2 weeks and is worsening.  Reports she has a large area on her ankle that is very itchy and small dots on her legs and arms that are very itchy.  No recent change in detergents, soaps, personal care products.  No new medications or supplements.  No shortness of breath or throat/tongue swelling.  Has not tried anything for symptoms so far.  Denies history of eczema.  Reports she is currently breast-feeding.    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   Elevated LFTs 04/26/2020   Hx of cholecystectomy 04/25/2020   Calculus of gallbladder without cholecystitis without obstruction 04/06/2020   Abdominal migraine [id number]   Cyclical vomiting [id number]   Allergic rhinitis 03/13/2013    Past Surgical History:  Procedure Laterality Date   CHOLECYSTECTOMY N/A 04/13/2020   Procedure: LAPAROSCOPIC CHOLECYSTECTOMY;  Surgeon: Virl Cagey, MD;  Location: [facility];  Service: General;  Laterality: N/A;   CHOLECYSTECTOMY     CHOLECYSTECTOMY     WISDOM TOOTH EXTRACTION      OB History     Gravida  1   Para  1   Term  1   Preterm  0   AB  0   Living  1      SAB  0   IAB  0   Ectopic  0   Multiple  0   Live Births  1            Home Medications    Prior to Admission medications   Medication Sig Start Date End Date Taking? Authorizing Provider  triamcinolone ointment (KENALOG) 0.1 % Apply 1 Application  topically 2 (two) times daily. Do not use for more than 14 days in a row 03/07/23  Yes Eulogio Bear, NP  levothyroxine (SYNTHROID) 75 MCG tablet Take 1 tablet (75 mcg total) by mouth daily before breakfast. 01/09/23   Vanessa Kick, MD    Family History Family History  Problem Relation Age of Onset   Cancer Father    Hypertension Father    Hyperlipidemia Father    Heart attack Father    Breast cancer Paternal Grandmother    Cancer Maternal Great-grandmother        bone marrow   Colon cancer Neg Hx    Esophageal cancer Neg Hx    Stomach cancer Neg Hx     Social History Social History   Tobacco Use   Smoking status: Never   Smokeless tobacco: Never  Vaping Use   Vaping Use: Never used  Substance Use Topics   Alcohol use: Yes    Comment: 1-2 drinks a month   Drug use: Not Currently     Allergies   Hydrocodone and Hydrocodone   Review of Systems Review of Systems Per HPI  Physical Exam Triage Vital Signs ED Triage Vitals  Enc Vitals Group  BP 03/07/23 1638 105/66     Pulse Rate 03/07/23 1638 83     Resp 03/07/23 1638 18     Temp 03/07/23 1638 98.2 F (36.8 C)     Temp Source 03/07/23 1638 Oral     SpO2 03/07/23 1638 97 %     Weight --      Height --      Head Circumference --      Peak Flow --      Pain Score 03/07/23 1639 0     Pain Loc --      Pain Edu? --      Excl. in [place]? --    No data found.  Updated Vital Signs BP 105/66 (BP Location: Right Arm)   Pulse 83   Temp 98.2 F (36.8 C) (Oral)   Resp 18   LMP 02/25/2023 (Approximate)   SpO2 97%   Visual Acuity Right Eye Distance:   Left Eye Distance:   Bilateral Distance:    Right Eye Near:   Left Eye Near:    Bilateral Near:     Physical Exam Vitals and nursing note reviewed.  Constitutional:      General: She is not in acute distress.    Appearance: Normal appearance. She is not toxic-appearing.  HENT:     Head: Normocephalic and atraumatic.     Mouth/Throat:     Mouth:  Mucous membranes are moist.     Pharynx: Oropharynx is clear.  Pulmonary:     Effort: Pulmonary effort is normal. No respiratory distress.  Skin:    General: Skin is warm and dry.     Capillary Refill: Capillary refill takes less than 2 seconds.     Coloration: Skin is not jaundiced or pale.     Findings: Erythema and rash present.          Comments: Fine, papular rash that is slightly erythematous to bilateral lower extremities and upper extremities diffusely.  There is no drainage, warmth, fluctuance.  There is a plaque to the left ankle in approximately area marked that is approximately 3 x 3 cm.  Neurological:     Mental Status: She is alert and oriented to person, place, and time.  Psychiatric:        Behavior: Behavior is cooperative.      UC Treatments / Results  Labs (all labs ordered are listed, but only abnormal results are displayed) Labs Reviewed - No data to display  EKG   Radiology No results found.  Procedures Procedures (including critical care time)  Medications Ordered in UC Medications - No data to display  Initial Impression / Assessment and Plan / UC Course  I have reviewed the triage vital signs and the nursing notes.  Pertinent labs & imaging results that were available during my care of the patient were reviewed by me and considered in my medical decision making (see chart for details).   Patient is well-appearing, normotensive, afebrile, not tachycardic, not tachypneic, oxygenating well on room air.    1. Atopic dermatitis, unspecified type Suspect atopic dermatitis Supportive care discussed with patient including starting twice daily moisturizers, emollient for as needed itching With no improvement, can use small amount of triamcinolone ointment that was sent to pharmacy Follow-up with primary care provider for persistent or worsening symptoms despite treatment   The patient was given the opportunity to ask questions.  All questions  answered to their satisfaction.  The patient is in agreement to this plan.  Final Clinical Impressions(s) / UC Diagnoses   Final diagnoses:  Atopic dermatitis, unspecified type     Discharge Instructions      Please start moisturizing your skin after showering with a nonalcohol or water-based moisturizer such as CeraVe or Aquaphor Apply a thin layer of petroleum based moisturizer to the itchy areas If this does not help with the rash that is itching over the next couple of days, you can apply a thin layer of the triamcinolone (steroid) ointment twice daily to the itchy areas-do not use this for more than 2 weeks in a row to prevent bacterial infection in the skin Follow-up with primary care provider if symptoms persist or worsen despite treatment    ED Prescriptions     Medication Sig Dispense Auth. Provider   triamcinolone ointment (KENALOG) 0.1 % Apply 1 Application topically 2 (two) times daily. Do not use for more than 14 days in a row 15 g Eulogio Bear, NP      PDMP not reviewed this encounter.   Eulogio Bear, NP 03/07/23 [phone]

## 2023-03-07 NOTE — ED Triage Notes (Signed)
Rash on both arms and left ankle x 2 weeks.  States rash itches.

## 2023-03-07 NOTE — Discharge Instructions (Addendum)
Please start moisturizing your skin after showering with a nonalcohol or water-based moisturizer such as CeraVe or Aquaphor Apply a thin layer of petroleum based moisturizer to the itchy areas If this does not help with the rash that is itching over the next couple of days, you can apply a thin layer of the triamcinolone (steroid) ointment twice daily to the itchy areas-do not use this for more than 2 weeks in a row to prevent bacterial infection in the skin Follow-up with primary care provider if symptoms persist or worsen despite treatment

## 2023-05-01 ENCOUNTER — Other Ambulatory Visit: Payer: Self-pay | Admitting: Nurse Practitioner

## 2023-05-01 DIAGNOSIS — E038 Other specified hypothyroidism: Secondary | ICD-10-CM

## 2023-05-01 NOTE — Patient Instructions (Signed)

## 2023-05-02 LAB — TSH: TSH: 18.4 u[IU]/mL — ABNORMAL HIGH (ref 0.450–4.500)

## 2023-05-02 LAB — T4, FREE: Free T4: 0.81 ng/dL — ABNORMAL LOW (ref 0.82–1.77)

## 2023-05-03 ENCOUNTER — Ambulatory Visit (INDEPENDENT_AMBULATORY_CARE_PROVIDER_SITE_OTHER): Payer: Self-pay | Admitting: Nurse Practitioner

## 2023-05-03 ENCOUNTER — Encounter: Payer: Self-pay | Admitting: Nurse Practitioner

## 2023-05-03 VITALS — BP 104/69 | HR 83 | Ht 67.0 in | Wt 150.2 lb

## 2023-05-03 DIAGNOSIS — E063 Autoimmune thyroiditis: Secondary | ICD-10-CM

## 2023-05-03 DIAGNOSIS — E038 Other specified hypothyroidism: Secondary | ICD-10-CM

## 2023-05-03 MED ORDER — LEVOTHYROXINE SODIUM 75 MCG PO TABS: 75.0000 ug | ORAL_TABLET | Freq: Every day | ORAL | 1 refills | Status: DC

## 2023-05-03 NOTE — Progress Notes (Signed)
Endocrinology Follow Up Note                                         05/03/2023, 9:59 AM  Subjective:   Subjective    Kathleen Grimes is a 22 y.o.-year-old female patient being seen in follow up after being seen in consultation for hypothyroidism referred by Kizzie Furnish D., PA-C.   Past Medical History:  Diagnosis Date   Abdominal migraine    Allergic rhinitis 03/13/2013   Allergy    Anemia    Hypothyroidism     Past Surgical History:  Procedure Laterality Date   CHOLECYSTECTOMY N/A 04/13/2020   Procedure: LAPAROSCOPIC CHOLECYSTECTOMY;  Surgeon: Lucretia Roers, MD;  Location: [facility];  Service: General;  Laterality: N/A;   CHOLECYSTECTOMY     CHOLECYSTECTOMY     WISDOM TOOTH EXTRACTION      Social History   Socioeconomic History   Marital status: Single    Spouse name: Not on file   Number of children: 1   Years of education: Not on file   Highest education level: Not on file  Occupational History   Not on file  Tobacco Use   Smoking status: Never   Smokeless tobacco: Never  Vaping Use   Vaping Use: Never used  Substance and Sexual Activity   Alcohol use: Yes    Comment: 1-2 drinks a month   Drug use: Not Currently   Sexual activity: Not Currently    Birth control/protection: None  Other Topics Concern   Not on file  Social History Narrative   ** Merged History Encounter **       Social Determinants of Health   Financial Resource Strain: Medium Risk (03/29/2021)   Overall Financial Resource Strain (CARDIA)    Difficulty of Paying Living Expenses: Somewhat hard  Food Insecurity: No Food Insecurity (03/29/2021)   Hunger Vital Sign    Worried About Running Out of Food in the Last Year: Never true    Ran Out of Food in the Last Year: Never true  Transportation Needs: No Transportation Needs (03/29/2021)   PRAPARE - Administrator, Civil Service (Medical): No    Lack of  Transportation (Non-Medical): No  Physical Activity: Sufficiently Active (03/29/2021)   Exercise Vital Sign    Days of Exercise per Week: 4 days    Minutes of Exercise per Session: 40 min  Stress: No Stress Concern Present (03/29/2021)   [employer] of Occupational Health - Occupational Stress Questionnaire    Feeling of Stress : Only a little  Social Connections: Socially Isolated (03/29/2021)   Social Connection and Isolation Panel [NHANES]    Frequency of Communication with Friends and Family: More than three times a week    Frequency of Social Gatherings with Friends and Family: Twice a week    Attends Religious Services: Never    Database administrator or Organizations: No    Attends Banker Meetings: Never    Marital Status:  Never married    Family History  Problem Relation Age of Onset   Cancer Father    Hypertension Father    Hyperlipidemia Father    Heart attack Father    Breast cancer Paternal Grandmother    Cancer Maternal Great-grandmother        bone marrow   Colon cancer Neg Hx    Esophageal cancer Neg Hx    Stomach cancer Neg Hx     Outpatient Encounter Medications as of 05/03/2023  Medication Sig   levothyroxine (SYNTHROID) 75 MCG tablet Take 1 tablet (75 mcg total) by mouth daily before breakfast.   triamcinolone ointment (KENALOG) 0.1 % Apply 1 Application topically 2 (two) times daily. Do not use for more than 14 days in a row (Patient not taking: Reported on 05/03/2023)   [DISCONTINUED] levothyroxine (SYNTHROID) 75 MCG tablet Take 1 tablet (75 mcg total) by mouth daily before breakfast. (Patient not taking: Reported on 05/03/2023)   No facility-administered encounter medications on file as of 05/03/2023.    ALLERGIES: Allergies  Allergen Reactions   Hydrocodone Nausea Only   Hydrocodone Nausea And Vomiting   VACCINATION STATUS: Immunization History  Administered Date(s) Administered   DTaP 03/06/2000, 05/07/2000, 07/05/2000,  08/06/2001, 06/06/2005   HIB (PRP-OMP) 03/06/2000, 05/07/2000, 07/05/2000, 08/06/2001   Hepatitis B 2000/06/19, 02/09/2000, 10/15/2000   IPV 03/06/2000, 05/07/2000, 10/15/2000, 06/06/2005   Influenza Nasal 11/25/2007, 01/19/2008, 10/08/2008   Influenza Whole 10/11/2009   MMR 01/06/2001, 06/06/2005   Pneumococcal Conjugate-13 03/06/2000, 05/07/2000, 07/05/2000, 01/22/2003   Td 08/09/2011   Tdap 08/09/2011, 08/08/2020   Varicella 01/06/2001     HPI   Kathleen Grimes  is a patient with the above medical history. she was diagnosed with hypothyroidism at approximate age of 20 years while at her 6 week postpartum OBGYN visit, which required subsequent initiation of thyroid hormone replacement. she was given various doses of Levothyroxine since, had brief period of time were she did not take it at all, currently on 75 micrograms but she admits she does not take it every day for no specific reason.  I reviewed patient's thyroid tests:  Lab Results  Component Value Date   TSH 18.400 (H) 05/01/2023   TSH 3.210 03/29/2022   TSH 2.590 11/24/2021   TSH 8.500 (H) 08/24/2021   TSH 100.280 (H) 06/19/2021   TSH 63.700 (H) 03/29/2021   FREET4 0.81 (L) 05/01/2023   FREET4 1.22 03/29/2022   FREET4 1.49 11/24/2021   FREET4 1.22 08/24/2021   FREET4 0.33 (L) 03/29/2021     Pt denies feeling nodules in neck, hoarseness, dysphagia/odynophagia, SOB with lying down.  she denies family history of thyroid disorders.  No family history of thyroid cancer.  No history of radiation therapy to head or neck.  No recent use of iodine supplements.  Denies use of Biotin containing supplements.   Review of systems  Constitutional: + increasing body weight,  current Body mass index is 23.52 kg/m. , + fatigue, no subjective hyperthermia, no subjective hypothermia Eyes: no blurry vision, no xerophthalmia ENT: + intermittent sore throat, no nodules palpated in throat, no dysphagia/odynophagia, no  hoarseness Cardiovascular: no chest pain, no shortness of breath, no palpitations, no leg swelling Respiratory: no cough, no shortness of breath Gastrointestinal: no nausea/vomiting/diarrhea Musculoskeletal: no muscle/joint aches Skin: no rashes, no hyperemia Neurological: no tremors, no numbness, no tingling, no dizziness Psychiatric: no depression, no anxiety   Objective:   Objective     BP 104/69 (BP Location: Left Arm, Patient Position: Sitting,  Cuff Size: Normal)   Pulse 83   Ht 5\' 7"  (1.702 m)   Wt 150 lb 3.2 oz (68.1 kg)   BMI 23.52 kg/m  Wt Readings from Last 3 Encounters:  05/03/23 150 lb 3.2 oz (68.1 kg)  02/18/23 135 lb (61.2 kg)  07/29/22 135 lb (61.2 kg)    BP Readings from Last 3 Encounters:  05/03/23 104/69  03/07/23 105/66  02/18/23 107/70      Physical Exam- Limited  Constitutional:  Body mass index is 23.52 kg/m. , not in acute distress, normal state of mind Eyes:  EOMI, no exophthalmos Thyroid: + gross goiter Musculoskeletal: no gross deformities, strength intact in all four extremities, no gross restriction of joint movements Skin:  no rashes, no hyperemia Neurological: no tremor with outstretched hands   CMP ( most recent) CMP     Component Value Date/Time   NA 135 02/18/2023 1857   NA 139 08/29/2020 1111   K 3.6 02/18/2023 1857   CL 104 02/18/2023 1857   CO2 20 (L) 02/18/2023 1857   GLUCOSE 146 (H) 02/18/2023 1857   BUN 18 02/18/2023 1857   BUN 5 (L) 08/29/2020 1111   CREATININE 0.74 02/18/2023 1857   CREATININE 0.66 08/02/2017 1458   CALCIUM 9.1 02/18/2023 1857   PROT 8.2 (H) 02/18/2023 1857   PROT 6.4 08/29/2020 1111   ALBUMIN 4.8 02/18/2023 1857   ALBUMIN 3.8 (L) 08/29/2020 1111   AST 34 02/18/2023 1857   ALT 38 02/18/2023 1857   ALKPHOS 44 02/18/2023 1857   BILITOT 2.2 (H) 02/18/2023 1857   BILITOT 0.3 08/29/2020 1111   GFRNONAA >60 02/18/2023 1857   GFRNONAA SEE NOTE 08/02/2017 1458   GFRAA 155 08/29/2020 1111   GFRAA  SEE NOTE 08/02/2017 1458     Diabetic Labs (most recent): No results found for: "HGBA1C", "MICROALBUR"   Lipid Panel ( most recent) Lipid Panel  No results found for: "CHOL", "TRIG", "HDL", "CHOLHDL", "VLDL", "LDLCALC", "LDLDIRECT", "LABVLDL"     Lab Results  Component Value Date   TSH 18.400 (H) 05/01/2023   TSH 3.210 03/29/2022   TSH 2.590 11/24/2021   TSH 8.500 (H) 08/24/2021   TSH 100.280 (H) 06/19/2021   TSH 63.700 (H) 03/29/2021   FREET4 0.81 (L) 05/01/2023   FREET4 1.22 03/29/2022   FREET4 1.49 11/24/2021   FREET4 1.22 08/24/2021   FREET4 0.33 (L) 03/29/2021    Thyroid US from 04/06/21  CLINICAL DATA:  Thyromegaly and hypothyroidism   EXAM: THYROID ULTRASOUND   TECHNIQUE: Ultrasound examination of the thyroid gland and adjacent soft tissues was performed.   COMPARISON:  04/07/2018   FINDINGS: Parenchymal Echotexture: Moderately heterogeneous   Isthmus: 0.6 cm   Right lobe: 5.2 x 2.1 x 2.3 cm   Left lobe: 5.1 x 1.8 x 1.9 cm   _________________________________________________________   Estimated total number of nodules >/= 1 cm: 0   Number of spongiform nodules >/=  2 cm not described below (TR1): 0   Number of mixed cystic and solid nodules >/= 1.5 cm not described below (TR2): 0   _________________________________________________________   No discrete nodules are seen within the thyroid gland.   IMPRESSION: Diffusely heterogeneous thyroid without discrete nodule.   The above is in keeping with the ACR TI-RADS recommendations - J Am Coll Radiol 2017;14:587-595.     Electronically Signed   By: Acquanetta Belling M.D.   On: 04/06/2021 16:57    Latest Reference Range & Units 08/24/21 13:33 11/24/21 13:28 03/29/22 11:26  05/01/23 09:13  TSH 0.450 - 4.500 uIU/mL 8.500 (H) 2.590 3.210 18.400 (H)  T4,Free(Direct) 0.82 - 1.77 ng/dL 4.09 8.11 9.14 7.82 (L)  Thyroperoxidase Ab SerPl-aCnc 0 - 34 IU/mL 104 (H)     Thyroglobulin Antibody 0.0 - 0.9 IU/mL  <1.0     (H): Data is abnormally high (L): Data is abnormally low  Assessment & Plan:   ASSESSMENT / PLAN:  1. Hypothyroidism-r/t Hashimotos Thyroiditis   Patient with relatively new hypothyroidism, on levothyroxine therapy- but she does not take it routinely.   -Her previsit thyroid function tests are consistent with under-replacement, consistent with her not taking her medications routinely.  She is advised to restart Levothyroxine 75 mcg po daily before breakfast.  - We discussed about correct intake of levothyroxine, at fasting, with water, separated by at least 30 minutes from breakfast, and separated by more than 4 hours from calcium, iron, multivitamins, acid reflux medications (PPIs). -Patient is made aware of the fact that thyroid hormone replacement is needed for life, dose to be adjusted by periodic monitoring of thyroid function tests.      I spent  19  minutes in the care of the patient today including review of labs from Thyroid Function, CMP, and other relevant labs ; imaging/biopsy records (current and previous including abstractions from other facilities); face-to-face time discussing  her lab results and symptoms, medications doses, her options of short and long term treatment based on the latest standards of care / guidelines;   and documenting the encounter.  Marcell Barlow Gonet  participated in the discussions, expressed understanding, and voiced agreement with the above plans.  All questions were answered to her satisfaction. she is encouraged to contact clinic should she have any questions or concerns prior to her return visit.   FOLLOW UP PLAN:  Return in about 3 months (around 08/03/2023) for Thyroid follow up, Previsit labs.   Ronny Bacon, [facility] [address] [place], [place] [address] Phone: [phone] Fax: [phone]  05/03/2023, 9:59 AM

## 2023-06-28 ENCOUNTER — Telehealth: Payer: Self-pay | Admitting: Physician Assistant

## 2023-06-28 DIAGNOSIS — B9689 Other specified bacterial agents as the cause of diseases classified elsewhere: Secondary | ICD-10-CM

## 2023-06-28 DIAGNOSIS — J028 Acute pharyngitis due to other specified organisms: Secondary | ICD-10-CM

## 2023-06-28 MED ORDER — AMOXICILLIN 500 MG PO CAPS: 500.0000 mg | ORAL_CAPSULE | Freq: Two times a day (BID) | ORAL | 0 refills | Status: AC

## 2023-06-28 NOTE — Progress Notes (Signed)
E-Visit for Sore Throat - Bacterial Pharyngitis  We are sorry that you are not feeling well.  Here is how we plan to help!  Based on what you have shared with me it is likely that you have bacterial pharyngitis.  Bacterial pharyngitis is inflammation and infection in the back of the throat.  This is an infection cause by bacteria and is treated with antibiotics.  I have prescribed Amoxicillin 500 mg twice a day for 10 days. For throat pain, we recommend over the counter oral pain relief medications such as acetaminophen or aspirin, or anti-inflammatory medications such as ibuprofen or naproxen sodium. Topical treatments such as oral throat lozenges or sprays may be used as needed. Bacterial infections are not as easily transmitted as other respiratory infections, however we still recommend that you avoid close contact with loved ones, especially the very young and elderly.  Remember to wash your hands thoroughly throughout the day as this is the number one way to prevent the spread of infection and wipe down door knobs and counters with disinfectant.   Home Care: Only take medications as instructed by your medical team. Complete the entire course of an antibiotic. Do not take these medications with alcohol. A steam or ultrasonic humidifier can help congestion.  You can place a towel over your head and breathe in the steam from hot water coming from a faucet. Avoid close contacts especially the very young and the elderly. Cover your mouth when you cough or sneeze. Always remember to wash your hands.  Get Help Right Away If: You develop worsening fever or sinus pain. You develop a severe head ache or visual changes. Your symptoms persist after you have completed your treatment plan.  Make sure you Understand these instructions. Will watch your condition. Will get help right away if you are not doing well or get worse.   Thank you for choosing an e-visit.  Your e-visit answers were reviewed  by a board certified advanced clinical practitioner to complete your personal care plan. Depending upon the condition, your plan could have included both over the counter or prescription medications.  Please review your pharmacy choice. Make sure the pharmacy is open so you can pick up prescription now. If there is a problem, you may contact your provider through [employer] and have the prescription routed to another pharmacy.  Your safety is important to [name]. If you have drug allergies check your prescription carefully.   For the next 24 hours you can use MyChart to ask questions about today's visit, request a non-urgent call back, or ask for a work or school excuse. You will get an email in the next two days asking about your experience. I hope that your e-visit has been valuable and will speed your recovery.   I have spent 5 minutes in review of e-visit questionnaire, review and updating patient chart, medical decision making and response to patient.   Margaretann Loveless, PA-C

## 2023-08-09 ENCOUNTER — Ambulatory Visit: Payer: Self-pay | Admitting: Nurse Practitioner

## 2023-08-23 ENCOUNTER — Encounter: Payer: Self-pay | Admitting: Emergency Medicine

## 2023-08-23 ENCOUNTER — Ambulatory Visit
Admission: EM | Admit: 2023-08-23 | Discharge: 2023-08-23 | Disposition: A | Discharge status: Home / Self Care | Payer: Self-pay | Attending: Physician Assistant | Admitting: Physician Assistant

## 2023-08-23 DIAGNOSIS — J4521 Mild intermittent asthma with (acute) exacerbation: Secondary | ICD-10-CM

## 2023-08-23 MED ORDER — CETIRIZINE HCL 10 MG PO TABS: 10.0000 mg | ORAL_TABLET | Freq: Every day | ORAL | 0 refills | Status: DC

## 2023-08-23 MED ORDER — PREDNISONE 20 MG PO TABS: 40.0000 mg | ORAL_TABLET | Freq: Every day | ORAL | 0 refills | Status: AC

## 2023-08-23 MED ORDER — ALBUTEROL SULFATE HFA 108 (90 BASE) MCG/ACT IN AERS
2.0000 | INHALATION_SPRAY | Freq: Once | RESPIRATORY_TRACT | Status: AC
  Administered 2023-08-23: 2 via RESPIRATORY_TRACT

## 2023-08-23 MED ORDER — METHYLPREDNISOLONE SODIUM SUCC 125 MG IJ SOLR
80.0000 mg | Freq: Once | INTRAMUSCULAR | Status: AC
  Administered 2023-08-23: 80 mg via INTRAMUSCULAR

## 2023-08-23 NOTE — ED Provider Notes (Signed)
[facility]    CSN: [id number] Arrival date & time: 08/23/23  1438      History   Chief Complaint No chief complaint on file.   HPI Kathleen Grimes is a 22 y.o. female.   Patient presents today with a 23-hour history of cough and chest tightness.  She has felt a wheezing but denies any shortness of breath, chest pain, fever, nausea, vomiting.  She does report that her son has been sick with similar symptoms but he has not been evaluated.  She does have seasonal allergies but is not taking any medication for this regularly.  Denies any recent antibiotics or steroids.  She denies history of asthma, COPD, smoking.  She is confident that she is not pregnant.  She is not breast-feeding.  She reports that the discomfort is rated 4/5 on a certain pain scale, described as a tightness, worse with deep breathing, no alleviating factors identified.  She denies any associated palpitations, lightheadedness.  Denies any recent immobilization, surgical procedure, exogenous hormone use, history of malignancy.    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   Elevated LFTs 04/26/2020   Hx of cholecystectomy 04/25/2020   Calculus of gallbladder without cholecystitis without obstruction 04/06/2020   Abdominal migraine 02/22/2015   Cyclical vomiting 02/22/2015   Allergic rhinitis 03/13/2013    Past Surgical History:  Procedure Laterality Date   CHOLECYSTECTOMY N/A 04/13/2020   Procedure: LAPAROSCOPIC CHOLECYSTECTOMY;  Surgeon: Lucretia Roers, MD;  Location: [facility];  Service: General;  Laterality: N/A;   CHOLECYSTECTOMY     CHOLECYSTECTOMY     WISDOM TOOTH EXTRACTION      OB History     Gravida  1   Para  1   Term  1   Preterm  0    AB  0   Living  1      SAB  0   IAB  0   Ectopic  0   Multiple  0   Live Births  1            Home Medications    Prior to Admission medications   Medication Sig Start Date End Date Taking? Authorizing Provider  cetirizine (ZYRTEC ALLERGY) 10 MG tablet Take 1 tablet (10 mg total) by mouth at bedtime. 08/23/23  Yes Irvin Lizama K, PA-C  predniSONE (DELTASONE) 20 MG tablet Take 2 tablets (40 mg total) by mouth daily for 5 days. 08/23/23 08/28/23 Yes Loisann Roach, Noberto Retort, PA-C  levothyroxine (SYNTHROID) 75 MCG tablet Take 1 tablet (75 mcg total) by mouth daily before breakfast. 05/03/23   Dani Gobble, NP    Family History Family History  Problem Relation Age of Onset   Cancer Father    Hypertension Father    Hyperlipidemia Father    Heart attack Father    Breast cancer Paternal Grandmother    Cancer Maternal Great-grandmother        bone marrow   Colon cancer Neg Hx    Esophageal cancer Neg Hx    Stomach cancer Neg Hx     Social History Social History   Tobacco Use   Smoking status: Never   Smokeless tobacco: Never  Vaping Use  Vaping status: Never Used  Substance Use Topics   Alcohol use: Yes    Comment: 1-2 drinks a month   Drug use: Not Currently     Allergies   Hydrocodone and Hydrocodone   Review of Systems Review of Systems  Constitutional:  Positive for activity change. Negative for appetite change, fatigue and fever.  HENT:  Negative for congestion, sinus pressure, sneezing and sore throat.   Respiratory:  Positive for chest tightness, shortness of breath and wheezing. Negative for cough.   Cardiovascular:  Negative for chest pain.  Gastrointestinal:  Negative for abdominal pain, diarrhea, nausea and vomiting.  Neurological:  Negative for dizziness, light-headedness and headaches.     Physical Exam Triage Vital Signs ED Triage Vitals [08/23/23 1445]  Encounter Vitals Group     BP 116/68     Systolic BP Percentile      Diastolic BP  Percentile      Pulse Rate 69     Resp 18     Temp 99 F (37.2 C)     Temp Source Oral     SpO2 97 %     Weight      Height      Head Circumference      Peak Flow      Pain Score 7     Pain Loc      Pain Education      Exclude from Growth Chart    No data found.  Updated Vital Signs BP 116/68 (BP Location: Right Arm)   Pulse 69   Temp 99 F (37.2 C) (Oral)   Resp 18   LMP 08/02/2023 (Exact Date)   SpO2 97%   Visual Acuity Right Eye Distance:   Left Eye Distance:   Bilateral Distance:    Right Eye Near:   Left Eye Near:    Bilateral Near:     Physical Exam Vitals reviewed.  Constitutional:      General: She is awake. She is not in acute distress.    Appearance: Normal appearance. She is well-developed. She is not ill-appearing.     Comments: Very pleasant female appears stated age in no acute distress   HENT:     Head: Normocephalic and atraumatic.     Right Ear: Tympanic membrane, ear canal and external ear normal. Tympanic membrane is not erythematous or bulging.     Left Ear: Tympanic membrane, ear canal and external ear normal. Tympanic membrane is not erythematous or bulging.     Nose:     Right Sinus: No maxillary sinus tenderness or frontal sinus tenderness.     Left Sinus: No maxillary sinus tenderness or frontal sinus tenderness.     Mouth/Throat:     Pharynx: Uvula midline. No oropharyngeal exudate or posterior oropharyngeal erythema.  Cardiovascular:     Rate and Rhythm: Normal rate and regular rhythm.     Heart sounds: Normal heart sounds, S1 normal and S2 normal. No murmur heard. Pulmonary:     Effort: Pulmonary effort is normal.     Breath sounds: Examination of the right-lower field reveals decreased breath sounds. Examination of the left-lower field reveals decreased breath sounds. Decreased breath sounds present. No wheezing, rhonchi or rales.     Comments: Reactive cough with deep breathing.  Decreased breath sounds and cough improved  following albuterol in clinic today. Lymphadenopathy:     Head:     Right side of head: No submental, submandibular or tonsillar adenopathy.  Left side of head: No submental, submandibular or tonsillar adenopathy.     Cervical: No cervical adenopathy.  Psychiatric:        Behavior: Behavior is cooperative.      UC Treatments / Results  Labs (all labs ordered are listed, but only abnormal results are displayed) Labs Reviewed - No data to display  EKG   Radiology No results found.  Procedures Procedures (including critical care time)  Medications Ordered in UC Medications  methylPREDNISolone sodium succinate (SOLU-MEDROL) 125 mg/2 mL injection 80 mg (80 mg Intramuscular Given 08/23/23 1506)  albuterol (VENTOLIN HFA) 108 (90 Base) MCG/ACT inhaler 2 puff (2 puffs Inhalation Given 08/23/23 1506)    Initial Impression / Assessment and Plan / UC Course  I have reviewed the triage vital signs and the nursing notes.  Pertinent labs & imaging results that were available during my care of the patient were reviewed by me and considered in my medical decision making (see chart for details).     Patient is well-appearing, afebrile, nontoxic, nontachycardic.  No evidence of acute infection on physical exam that would warrant initiation of antibiotics.  Viral testing was deferred as she denies any significant URI symptoms.  Discussed symptoms are more consistent with allergy induced asthma.  She was given Solu-Medrol and albuterol in clinic with resolution of symptoms.  She was sent home with albuterol with instruction to use this every 4-6 hours as needed.  Will start cetirizine to help manage congestion/allergy symptoms as well as a prednisone burst to begin tomorrow (08/24/2023).  We discussed that she is not to take NSAIDs with this medication due to risk of GI bleeding.  She can use over-the-counter medications as needed.  If symptoms or not improving within a few days she is to return for  reevaluation.  Discussed that if she has recurrent symptoms she should follow-up with PCP for additional testing and management options.  Strict return precautions given.  Work excuse note provided.  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  Use the albuterol inhaler every 4-6 hours as needed.  We gave an injection of steroids today so start prednisone tomorrow (08/24/2023).  Do not take NSAIDs with this medication including aspirin, ibuprofen/Advil, naproxen/Aleve.  Use cetirizine daily to help with allergy symptoms.  Make sure that you rest and drink plenty of fluid.  Use a humidifier in the room as well as nasal saline/sinus rinses.  If anything worsens and you have increasing shortness of breath, worsening cough, recurrent chest pain/chest tightness, high fever you need to be seen immediately.     ED Prescriptions     Medication Sig Dispense Auth. Provider   cetirizine (ZYRTEC ALLERGY) 10 MG tablet Take 1 tablet (10 mg total) by mouth at bedtime. 30 tablet Bowen Goyal K, PA-C   predniSONE (DELTASONE) 20 MG tablet Take 2 tablets (40 mg total) by mouth daily for 5 days. 10 tablet Treshaun Carrico, Noberto Retort, PA-C      PDMP not reviewed this encounter.   Jeani Hawking, PA-C 08/23/23 1538

## 2023-08-23 NOTE — ED Triage Notes (Signed)
Woke up this morning with mid chest pain.  States she has a wheezing feeling in chest.

## 2023-08-23 NOTE — Discharge Instructions (Signed)
As we discussed, I am concerned that you might be developing asthma or something similar.  Use the albuterol inhaler every 4-6 hours as needed.  We gave an injection of steroids today so start prednisone tomorrow (08/24/2023).  Do not take NSAIDs with this medication including aspirin, ibuprofen/Advil, naproxen/Aleve.  Use cetirizine daily to help with allergy symptoms.  Make sure that you rest and drink plenty of fluid.  Use a humidifier in the room as well as nasal saline/sinus rinses.  If anything worsens and you have increasing shortness of breath, worsening cough, recurrent chest pain/chest tightness, high fever you need to be seen immediately.

## 2023-08-25 ENCOUNTER — Encounter (HOSPITAL_COMMUNITY): Payer: Self-pay

## 2023-08-25 ENCOUNTER — Emergency Department (HOSPITAL_COMMUNITY)
Admission: EM | Admit: 2023-08-25 | Discharge: 2023-08-25 | Disposition: A | Discharge status: Home / Self Care | Payer: Self-pay | Attending: Student | Admitting: Student

## 2023-08-25 ENCOUNTER — Emergency Department (HOSPITAL_COMMUNITY): Payer: Self-pay

## 2023-08-25 ENCOUNTER — Emergency Department (HOSPITAL_COMMUNITY)
Admission: EM | Admit: 2023-08-25 | Discharge: 2023-08-26 | Disposition: A | Discharge status: Home / Self Care | Payer: Self-pay | Attending: Emergency Medicine | Admitting: Emergency Medicine

## 2023-08-25 ENCOUNTER — Other Ambulatory Visit: Payer: Self-pay

## 2023-08-25 ENCOUNTER — Encounter (HOSPITAL_COMMUNITY): Payer: Self-pay | Admitting: Emergency Medicine

## 2023-08-25 DIAGNOSIS — Z1152 Encounter for screening for COVID-19: Secondary | ICD-10-CM | POA: Insufficient documentation

## 2023-08-25 DIAGNOSIS — J069 Acute upper respiratory infection, unspecified: Secondary | ICD-10-CM | POA: Insufficient documentation

## 2023-08-25 LAB — RESP PANEL BY RT-PCR (RSV, FLU A&B, COVID)  RVPGX2
Influenza A by PCR: NEGATIVE
Influenza B by PCR: NEGATIVE
Resp Syncytial Virus by PCR: NEGATIVE
SARS Coronavirus 2 by RT PCR: NEGATIVE

## 2023-08-25 NOTE — ED Triage Notes (Signed)
Pt c/o cough and congestion x3 days.  Pt was seen at [facility] x2 days ago for same and diagnosed w/ asthma.  Pt was prescribed prednisone and an inhaler.  Pt reports no relief.  Pain score 4/10.

## 2023-08-25 NOTE — Discharge Instructions (Signed)
It was a pleasure taking care of you today.  You are seen for cough, your chest x-ray is normal there is no sign of pneumonia.  Your COVID flu and RSV test are in process and you can view the results on your MyChart when they become available later today.  If you are positive for any of these viruses you just need to continue drink plenty fluids, use over-the-counter medication as needed for symptoms and follow-up with your doctor.  Avoid contact with other people so you do not get any sick.

## 2023-08-25 NOTE — ED Provider Notes (Signed)
[place] EMERGENCY DEPARTMENT AT [facility] Provider Note   CSN: [id number] Arrival date & time: 08/25/23  1135     History  Chief Complaint  Patient presents with   Cough   Nasal Congestion    Kathleen Grimes is a 22 y.o. female.  Presents the ER today complaining of cough for about 4 days no fever, no sputum production, no shortness of breath but states she also has runny nose.  Her son has similar symptoms that she took him to the ER and he has pneumonia and she is concerned she could have the same.  Went to urgent care couple of days ago and was told she has asthma, she is never had asthma past, is not a smoker, she is prescribed steroids and inhaler and states this is not helping her.   Cough      Home Medications Prior to Admission medications   Medication Sig Start Date End Date Taking? Authorizing Provider  cetirizine (ZYRTEC ALLERGY) 10 MG tablet Take 1 tablet (10 mg total) by mouth at bedtime. 08/23/23   Raspet, Noberto Retort, PA-C  levothyroxine (SYNTHROID) 75 MCG tablet Take 1 tablet (75 mcg total) by mouth daily before breakfast. 05/03/23   Dani Gobble, NP  predniSONE (DELTASONE) 20 MG tablet Take 2 tablets (40 mg total) by mouth daily for 5 days. 08/23/23 08/28/23  Raspet, Noberto Retort, PA-C      Allergies    Hydrocodone and Hydrocodone    Review of Systems   Review of Systems  Respiratory:  Positive for cough.     Physical Exam Updated Vital Signs BP 119/79 (BP Location: Right Arm)   Pulse 73   Temp 98.7 F (37.1 C) (Oral)   Resp 16   Ht 5\' 7"  (1.702 m)   Wt 72.6 kg   LMP 08/02/2023 (Exact Date)   SpO2 100%   BMI 25.06 kg/m  Physical Exam Vitals and nursing note reviewed.  Constitutional:      General: She is not in acute distress.    Appearance: She is well-developed.  HENT:     Head: Normocephalic and atraumatic.     Right Ear: Ear canal and external ear normal. A middle ear effusion is present. Tympanic membrane is not injected,  erythematous, retracted or bulging.     Left Ear: Ear canal and external ear normal. A middle ear effusion is present. Tympanic membrane is not injected, erythematous, retracted or bulging.     Mouth/Throat:     Mouth: Mucous membranes are moist.     Pharynx: Oropharynx is clear.  Eyes:     Conjunctiva/sclera: Conjunctivae normal.  Cardiovascular:     Rate and Rhythm: Normal rate and regular rhythm.     Heart sounds: No murmur heard. Pulmonary:     Effort: Pulmonary effort is normal. No respiratory distress.     Breath sounds: Normal breath sounds.  Abdominal:     Palpations: Abdomen is soft.     Tenderness: There is no abdominal tenderness.  Musculoskeletal:        General: No swelling.     Cervical back: Neck supple.  Skin:    General: Skin is warm and dry.     Capillary Refill: Capillary refill takes less than 2 seconds.  Neurological:     General: No focal deficit present.     Mental Status: She is alert and oriented to person, place, and time.     Motor: No weakness.  Psychiatric:  Mood and Affect: Mood normal.     ED Results / Procedures / Treatments   Labs (all labs ordered are listed, but only abnormal results are displayed) Labs Reviewed  RESP PANEL BY RT-PCR (RSV, FLU A&B, COVID)  RVPGX2    EKG None  Radiology DG Chest 2 View  Result Date: 08/25/2023 CLINICAL DATA:  Cough EXAM: CHEST - 2 VIEW COMPARISON:  12/26/2018 FINDINGS: The heart size and mediastinal contours are within normal limits. Both lungs are clear. The visualized skeletal structures are unremarkable. IMPRESSION: No active cardiopulmonary disease. Electronically Signed   By: Duanne Guess D.O.   On: 08/25/2023 13:51    Procedures Procedures    Medications Ordered in ED Medications - No data to display  ED Course/ Medical Decision Making/ A&P                                 Medical Decision Making This patient presents to the ED for concern of cough, son has pneumonia, patient  concerned she has pneumonia as well, this involves an extensive number of treatment options, and is a complaint that carries with it a high risk of complications and morbidity.  The differential diagnosis includes pneumonia, bronchitis, URI, asthma exacerbation, sinusitis, other    Additional history obtained:  Additional history obtained from EMR External records from outside source obtained and reviewed including notes   Lab Tests:  I Ordered, and personally interpreted labs.  The pertinent results include: Negative COVID flu and RSV   Imaging Studies ordered:  I ordered imaging studies including chest x-ray I independently visualized and interpreted imaging which showed infiltrate, no pulmonary edema I agree with the radiologist interpretation     Problem List / ED Course / Critical interventions / Medication management  Patient here for intermittent cough, no fevers, no chills, no sputum production, well-appearing well-hydrated.  Was seen in urgent care and told she had asthma, she has been on steroids and inhaler without change in symptoms.  She is not having any wheezing, no history of asthma she does not smoke.  Feels like she has developed asthma that she could have been having some reactive bronchospasm.  Likely URI.  She will do chest x-ray to make sure no pneumonia because child had x-ray recently with pneumonia and he has similar symptoms.  Advised on follow-up and return precautions. I  I have reviewed the patients home medicines and have made adjustments as needed       Amount and/or Complexity of Data Reviewed Radiology: ordered.           Final Clinical Impression(s) / ED Diagnoses Final diagnoses:  Viral upper respiratory tract infection    Rx / DC Orders ED Discharge Orders     None         Josem Kaufmann 08/25/23 1841    Glendora Score, MD 08/27/23 [phone]

## 2023-08-25 NOTE — ED Triage Notes (Signed)
Pt seen earlier today and diagnosed with URI. States that she is here because she had a fever after discharge. Temp at home 103, took 1000mg  tylenol around 8pm.

## 2023-08-26 MED ORDER — IBUPROFEN 200 MG PO TABS
400.0000 mg | ORAL_TABLET | Freq: Once | ORAL | Status: AC
  Administered 2023-08-26: 400 mg via ORAL
  Filled 2023-08-26: qty 2

## 2023-08-26 NOTE — ED Provider Notes (Signed)
[place] EMERGENCY DEPARTMENT AT [facility] Provider Note   CSN: [id number] Arrival date & time: 08/25/23  2158     History  Chief Complaint  Patient presents with   URI    Kathleen Grimes is a 22 y.o. female.  The history is provided by the patient.  URI Presenting symptoms: cough and fever   Severity:  Moderate Onset quality:  Gradual Timing:  Intermittent Progression:  Waxing and waning Chronicity:  New Relieved by:  Nothing Worsened by:  Nothing Ineffective treatments:  OTC medications Associated symptoms: no myalgias   Risk factors: not elderly   Seen earlier for URI, negative CXR.  Now fever.  Son is also a patient.       Home Medications Prior to Admission medications   Medication Sig Start Date End Date Taking? Authorizing Provider  cetirizine (ZYRTEC ALLERGY) 10 MG tablet Take 1 tablet (10 mg total) by mouth at bedtime. 08/23/23   Raspet, Noberto Retort, PA-C  levothyroxine (SYNTHROID) 75 MCG tablet Take 1 tablet (75 mcg total) by mouth daily before breakfast. 05/03/23   Dani Gobble, NP  predniSONE (DELTASONE) 20 MG tablet Take 2 tablets (40 mg total) by mouth daily for 5 days. 08/23/23 08/28/23  Raspet, Noberto Retort, PA-C      Allergies    Hydrocodone and Hydrocodone    Review of Systems   Review of Systems  Constitutional:  Positive for fever.  HENT:  Negative for drooling and facial swelling.   Respiratory:  Positive for cough. Negative for shortness of breath.   Gastrointestinal:  Negative for vomiting.  Musculoskeletal:  Negative for myalgias.  All other systems reviewed and are negative.   Physical Exam Updated Vital Signs BP 116/72 (BP Location: Right Arm)   Pulse (!) 111   Temp 99.3 F (37.4 C) (Oral)   Resp 18   LMP 08/02/2023 (Exact Date)   SpO2 98%  Physical Exam Vitals and nursing note reviewed.  Constitutional:      General: She is not in acute distress.    Appearance: She is well-developed.  HENT:     Head: Normocephalic and  atraumatic.     Nose: Nose normal.  Eyes:     Pupils: Pupils are equal, round, and reactive to light.  Cardiovascular:     Rate and Rhythm: Normal rate and regular rhythm.     Pulses: Normal pulses.     Heart sounds: Normal heart sounds.  Pulmonary:     Effort: Pulmonary effort is normal. No respiratory distress.     Breath sounds: Normal breath sounds.  Abdominal:     General: Bowel sounds are normal. There is no distension.     Palpations: Abdomen is soft.     Tenderness: There is no abdominal tenderness. There is no guarding or rebound.  Musculoskeletal:        General: Normal range of motion.     Cervical back: Normal range of motion and neck supple.  Skin:    General: Skin is dry.     Capillary Refill: Capillary refill takes less than 2 seconds.     Findings: No erythema or rash.  Neurological:     General: No focal deficit present.     Deep Tendon Reflexes: Reflexes normal.  Psychiatric:        Mood and Affect: Mood normal.     ED Results / Procedures / Treatments   Labs (all labs ordered are listed, but only abnormal results are displayed) Labs  Reviewed - No data to display  EKG None  Radiology DG Chest 2 View  Result Date: 08/25/2023 CLINICAL DATA:  Cough EXAM: CHEST - 2 VIEW COMPARISON:  12/26/2018 FINDINGS: The heart size and mediastinal contours are within normal limits. Both lungs are clear. The visualized skeletal structures are unremarkable. IMPRESSION: No active cardiopulmonary disease. Electronically Signed   By: Duanne Guess D.O.   On: 08/25/2023 13:51    Procedures Procedures    Medications Ordered in ED Medications  ibuprofen (ADVIL) tablet 400 mg (400 mg Oral Given 08/26/23 0052)    ED Course/ Medical Decision Making/ A&P                                 Medical Decision Making Patient with URI now with fever  Amount and/or Complexity of Data Reviewed External Data Reviewed: radiology and notes.    Details: Previous notes and CXR  reviewed   Risk OTC drugs. Risk Details: Well appearing.  Lungs clear.  Afebrile.  Stable for discharge.  Tylenol and motrin sheet provided.  Stable for discharge.      Final Clinical Impression(s) / ED Diagnoses Final diagnoses:  Upper respiratory tract infection, unspecified type   Return for intractable cough, coughing up blood, fevers > 100.4 unrelieved by medication, shortness of breath, intractable vomiting, chest pain, shortness of breath, weakness, numbness, changes in speech, facial asymmetry, abdominal pain, passing out, Inability to tolerate liquids or food, cough, altered mental status or any concerns. No signs of systemic illness or infection. The patient is nontoxic-appearing on exam and vital signs are within normal limits.  I have reviewed the triage vital signs and the nursing notes. Pertinent labs & imaging results that were available during my care of the patient were reviewed by me and considered in my medical decision making (see chart for details). After history, exam, and medical workup I feel the patient has been appropriately medically screened and is safe for discharge home. Pertinent diagnoses were discussed with the patient. Patient was given return precautions.    Rx / DC Orders ED Discharge Orders     None         Rhodesia Stanger, MD 08/26/23 1610

## 2023-09-27 ENCOUNTER — Ambulatory Visit: Payer: Self-pay | Admitting: Nurse Practitioner

## 2023-11-28 ENCOUNTER — Ambulatory Visit: Payer: Self-pay | Admitting: Nurse Practitioner

## 2024-01-04 ENCOUNTER — Emergency Department (HOSPITAL_COMMUNITY)
Admission: EM | Admit: 2024-01-04 | Discharge: 2024-01-04 | Disposition: A | Discharge status: Home / Self Care | Attending: Emergency Medicine | Admitting: Emergency Medicine

## 2024-01-04 ENCOUNTER — Other Ambulatory Visit: Payer: Self-pay

## 2024-01-04 ENCOUNTER — Emergency Department (HOSPITAL_COMMUNITY)

## 2024-01-04 DIAGNOSIS — Z20822 Contact with and (suspected) exposure to covid-19: Secondary | ICD-10-CM | POA: Diagnosis not present

## 2024-01-04 DIAGNOSIS — R1012 Left upper quadrant pain: Secondary | ICD-10-CM | POA: Insufficient documentation

## 2024-01-04 DIAGNOSIS — R110 Nausea: Secondary | ICD-10-CM | POA: Diagnosis not present

## 2024-01-04 DIAGNOSIS — R1013 Epigastric pain: Secondary | ICD-10-CM | POA: Insufficient documentation

## 2024-01-04 DIAGNOSIS — R7401 Elevation of levels of liver transaminase levels: Secondary | ICD-10-CM | POA: Insufficient documentation

## 2024-01-04 DIAGNOSIS — R519 Headache, unspecified: Secondary | ICD-10-CM | POA: Insufficient documentation

## 2024-01-04 LAB — CBC WITH DIFFERENTIAL/PLATELET
Abs Immature Granulocytes: 0.02 10*3/uL (ref 0.00–0.07)
Basophils Absolute: 0 10*3/uL (ref 0.0–0.1)
Basophils Relative: 0 %
Eosinophils Absolute: 0.1 10*3/uL (ref 0.0–0.5)
Eosinophils Relative: 1 %
HCT: 39.4 % (ref 36.0–46.0)
Hemoglobin: 13.7 g/dL (ref 12.0–15.0)
Immature Granulocytes: 0 %
Lymphocytes Relative: 13 %
Lymphs Abs: 0.9 10*3/uL (ref 0.7–4.0)
MCH: 32.1 pg (ref 26.0–34.0)
MCHC: 34.8 g/dL (ref 30.0–36.0)
MCV: 92.3 fL (ref 80.0–100.0)
Monocytes Absolute: 0.4 10*3/uL (ref 0.1–1.0)
Monocytes Relative: 6 %
Neutro Abs: 5.3 10*3/uL (ref 1.7–7.7)
Neutrophils Relative %: 80 %
Platelets: 158 10*3/uL (ref 150–400)
RBC: 4.27 MIL/uL (ref 3.87–5.11)
RDW: 12.2 % (ref 11.5–15.5)
WBC: 6.8 10*3/uL (ref 4.0–10.5)
nRBC: 0 % (ref 0.0–0.2)

## 2024-01-04 LAB — URINALYSIS, ROUTINE W REFLEX MICROSCOPIC
Bilirubin Urine: NEGATIVE
Glucose, UA: NEGATIVE mg/dL
Hgb urine dipstick: NEGATIVE
Ketones, ur: NEGATIVE mg/dL
Leukocytes,Ua: NEGATIVE
Nitrite: NEGATIVE
Protein, ur: NEGATIVE mg/dL
Specific Gravity, Urine: 1.016 (ref 1.005–1.030)
pH: 6 (ref 5.0–8.0)

## 2024-01-04 LAB — COMPREHENSIVE METABOLIC PANEL
ALT: 55 U/L — ABNORMAL HIGH (ref 0–44)
AST: 92 U/L — ABNORMAL HIGH (ref 15–41)
Albumin: 4 g/dL (ref 3.5–5.0)
Alkaline Phosphatase: 43 U/L (ref 38–126)
Anion gap: 4 — ABNORMAL LOW (ref 5–15)
BUN: 11 mg/dL (ref 6–20)
CO2: 25 mmol/L (ref 22–32)
Calcium: 8.7 mg/dL — ABNORMAL LOW (ref 8.9–10.3)
Chloride: 108 mmol/L (ref 98–111)
Creatinine, Ser: 0.62 mg/dL (ref 0.44–1.00)
GFR, Estimated: >60 mL/min (ref >60)
Glucose, Bld: 123 mg/dL — ABNORMAL HIGH (ref 70–99)
Potassium: 3.7 mmol/L (ref 3.5–5.1)
Sodium: 137 mmol/L (ref 135–145)
Total Bilirubin: 1.4 mg/dL — ABNORMAL HIGH (ref 0.0–1.2)
Total Protein: 6.9 g/dL (ref 6.5–8.1)

## 2024-01-04 LAB — POC URINE PREG, ED: Preg Test, Ur: NEGATIVE

## 2024-01-04 LAB — RESP PANEL BY RT-PCR (RSV, FLU A&B, COVID)  RVPGX2
Influenza A by PCR: NEGATIVE
Influenza B by PCR: NEGATIVE
Resp Syncytial Virus by PCR: NEGATIVE
SARS Coronavirus 2 by RT PCR: NEGATIVE

## 2024-01-04 LAB — LIPASE, BLOOD: Lipase: 25 U/L (ref 11–51)

## 2024-01-04 MED ORDER — SODIUM CHLORIDE 0.9 % IV BOLUS
1000.0000 mL | Freq: Once | INTRAVENOUS | Status: AC
  Administered 2024-01-04: 1000 mL via INTRAVENOUS

## 2024-01-04 MED ORDER — ONDANSETRON HCL 4 MG/2ML IJ SOLN
4.0000 mg | Freq: Once | INTRAMUSCULAR | Status: AC
  Administered 2024-01-04: 4 mg via INTRAVENOUS
  Filled 2024-01-04: qty 2

## 2024-01-04 MED ORDER — DROPERIDOL 2.5 MG/ML IJ SOLN
1.2500 mg | Freq: Once | INTRAMUSCULAR | Status: AC
  Administered 2024-01-04: 1.25 mg via INTRAVENOUS
  Filled 2024-01-04: qty 2

## 2024-01-04 MED ORDER — PANTOPRAZOLE SODIUM 20 MG PO TBEC: 20.0000 mg | DELAYED_RELEASE_TABLET | Freq: Every day | ORAL | 0 refills | Status: DC

## 2024-01-04 MED ORDER — IOHEXOL 300 MG/ML  SOLN
100.0000 mL | Freq: Once | INTRAMUSCULAR | Status: AC | PRN
  Administered 2024-01-04: 100 mL via INTRAVENOUS

## 2024-01-04 MED ORDER — FAMOTIDINE IN NACL 20-0.9 MG/50ML-% IV SOLN
20.0000 mg | Freq: Once | INTRAVENOUS | Status: AC
  Administered 2024-01-04: 20 mg via INTRAVENOUS
  Filled 2024-01-04: qty 50

## 2024-01-04 NOTE — ED Notes (Signed)
POC urine pregnancy was negative. 

## 2024-01-04 NOTE — ED Provider Notes (Signed)
[place] EMERGENCY DEPARTMENT AT [facility] Provider Note   CSN: [id number] Arrival date & time: 01/04/24  3329     History  Chief Complaint  Patient presents with   Abdominal Pain    Kathleen Grimes is a 23 y.o. female presenting for eval ration of epigastric to left upper abdominal burning pain which woke her from sleep around 7 AM today.  She endorses nausea without emesis, no diarrhea, last bowel movement was after she woke this morning which did not seem to affect her abdominal pain symptoms.  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  She does endorse a mild headache since waking.  She last ate pizza and a hot pocket around 9 PM last night.  There is no radiation of pain, denies dysuria, hematuria, back or flank pain.  Her abdominal pain seems to worsen when she takes a deep breath, denies shortness of breath or chest pain, no recent cough fevers or URI type symptoms.  Has had no medications for symptoms prior to arrival.  The history is provided by the patient.       Home Medications Prior to Admission medications   Medication Sig Start Date End Date Taking? Authorizing Provider  pantoprazole (PROTONIX) 20 MG tablet Take 1 tablet (20 mg total) by mouth daily. 01/04/24  Yes Deneise Getty, Raynelle Fanning, PA-C  cetirizine (ZYRTEC ALLERGY) 10 MG tablet Take 1 tablet (10 mg total) by mouth at bedtime. 08/23/23   Raspet, Noberto Retort, PA-C  levothyroxine (SYNTHROID) 75 MCG tablet Take 1 tablet (75 mcg total) by mouth daily before breakfast. 05/03/23   Dani Gobble, NP      Allergies    Hydrocodone and Hydrocodone    Review of Systems   Review of Systems  Constitutional:  Negative for chills and fever.  HENT:  Negative for congestion.   Eyes: Negative.   Respiratory:  Negative for chest  tightness and shortness of breath.   Cardiovascular:  Negative for chest pain.  Gastrointestinal:  Positive for abdominal pain and nausea. Negative for constipation, diarrhea and vomiting.  Genitourinary: Negative.   Musculoskeletal:  Negative for arthralgias, joint swelling and neck pain.  Skin: Negative.  Negative for rash and wound.  Neurological:  Positive for headaches. Negative for dizziness, weakness, light-headedness and numbness.  Psychiatric/Behavioral: Negative.      Physical Exam Updated Vital Signs BP 105/69   Pulse 92   Temp 98.6 F (37 C) (Oral)   Resp 16   SpO2 99%  Physical Exam Vitals and nursing note reviewed.  Constitutional:      Appearance: She is well-developed.  HENT:     Head: Normocephalic and atraumatic.  Eyes:     Conjunctiva/sclera: Conjunctivae normal.  Cardiovascular:     Rate and Rhythm: Normal rate and regular rhythm.     Heart sounds: Normal heart sounds.  Pulmonary:     Effort: Pulmonary effort is normal.     Breath sounds: Normal breath sounds. No wheezing.  Abdominal:     General: Bowel sounds are normal.     Palpations: Abdomen is soft.     Tenderness: There is abdominal tenderness in the epigastric area and left upper quadrant. There is no guarding or rebound.  Musculoskeletal:        General: Normal range of motion.  Cervical back: Normal range of motion.  Skin:    General: Skin is warm and dry.  Neurological:     Mental Status: She is alert.     ED Results / Procedures / Treatments   Labs (all labs ordered are listed, but only abnormal results are displayed) Labs Reviewed  COMPREHENSIVE METABOLIC PANEL - Abnormal; Notable for the following components:      Result Value   Glucose, Bld 123 (*)    Calcium 8.7 (*)    AST 92 (*)    ALT 55 (*)    Total Bilirubin 1.4 (*)    Anion gap 4 (*)    All other components within normal limits  RESP PANEL BY RT-PCR (RSV, FLU A&B, COVID)  RVPGX2  URINALYSIS, ROUTINE W REFLEX  MICROSCOPIC  CBC WITH DIFFERENTIAL/PLATELET  LIPASE, BLOOD  POC URINE PREG, ED    EKG None  Radiology CT ABDOMEN PELVIS W CONTRAST Result Date: 01/04/2024 CLINICAL DATA:  Left upper quadrant abdominal pain. Central abdominal pain. Burning. Some nausea. EXAM: CT ABDOMEN AND PELVIS WITH CONTRAST TECHNIQUE: Multidetector CT imaging of the abdomen and pelvis was performed using the standard protocol following bolus administration of intravenous contrast. RADIATION DOSE REDUCTION: This exam was performed according to the departmental dose-optimization program which includes automated exposure control, adjustment of the mA and/or kV according to patient size and/or use of iterative reconstruction technique. CONTRAST:  OMNIPAQUE IOHEXOL 300 MG/ML  SOLN COMPARISON:  Ultrasound 04/06/2020 FINDINGS: Lower chest: Normal Hepatobiliary: Liver parenchyma is normal. Previous cholecystectomy. Pancreas: Normal Spleen: Normal Adrenals/Urinary Tract: Adrenal glands are normal. Kidneys are normal. Bladder is normal. Stomach/Bowel: Stomach and small intestine are normal. Normal appendix or appendiceal stump. Remainder of the colon is normal. Vascular/Lymphatic: Aorta and IVC are normal.  No adenopathy. Reproductive: Uterus and adnexal regions are normal by CT. Other: No free fluid or air. Musculoskeletal: Normal IMPRESSION: No acute finding by CT. Previous cholecystectomy. No abnormality seen to explain the presenting symptoms. Electronically Signed   By: Paulina Fusi M.D.   On: 01/04/2024 13:16    Procedures Procedures    Medications Ordered in ED Medications  ondansetron (ZOFRAN) injection 4 mg (4 mg Intravenous Given 01/04/24 1046)  famotidine (PEPCID) IVPB 20 mg premix (0 mg Intravenous Stopped 01/04/24 1119)  droperidol (INAPSINE) 2.5 MG/ML injection 1.25 mg (1.25 mg Intravenous Given 01/04/24 1153)  sodium chloride 0.9 % bolus 1,000 mL (0 mLs Intravenous Stopped 01/04/24 1342)  iohexol (OMNIPAQUE) 300  MG/ML solution 100 mL (100 mLs Intravenous Contrast Given 01/04/24 1249)    ED Course/ Medical Decision Making/ A&P                                 Medical Decision Making Pt presenting with epigastric to LUQ abd pain, nausea, afebrile, with stable labs, no emesis. PT does not have a gallbladder.  DDX including gastritis, pancreatitis, retained CBD stone, constipation.  Labs and imaging reassuring. Discussed mild elevation in her LFT's - discussed etoh,  no recent exposures. No sig tylenol use.  No ruq pain.  Exam c/w gastrititis - PPI started,  referral to GI for f/u care if sx persist.  Return precautions outlined for worsened sx.   12:36 PM At re-exam, pt sleeping, nausea gone after receiving droperidol,  still with ttp, no guarding localizing to LUQ. CT abd/pelvis ordered.   Additionally,  bp was found to be reduced 79/55 after receiving droperidol.  IV NS bolus given.  Additional bp measurements taken,  still low although pt denies weakness or lightheadness, was able to ambulate to the bathroom without assistance.  Her bp cuff was over her sweatshirt.  When bp taken properly bp 105/69, which is consistent with her prior bps upon review of chart.   Amount and/or Complexity of Data Reviewed Labs: ordered.    Details: Cmet sig for mild elevated LFT's,  elevated billirubin at 1.4 is chronic. Norml wbc count at 6.8.. urine negative.   Radiology: ordered.    Details: Ct abd/pelvis negative.   Risk Prescription drug management.           Final Clinical Impression(s) / ED Diagnoses Final diagnoses:  Epigastric pain    Rx / DC Orders ED Discharge Orders          Ordered    pantoprazole (PROTONIX) 20 MG tablet  Daily        01/04/24 1408              Burgess Amor, PA-C 01/04/24 1457    Anders Simmonds T, DO 01/05/24 [phone]

## 2024-01-04 NOTE — ED Notes (Signed)
Provider ordered to hold fluid challenge.

## 2024-01-04 NOTE — ED Triage Notes (Signed)
Pt arrived via [facility] from home c/o Upper mid abd pain x 0700 today that is burning in nature. Rates pain a 2/10. Endorses HA, denies blurry vision. Also endorses nausea but denies any vomiting at this time. Pt states "its a possibility I might be pregnant", inconsistent with birth control pills pt states

## 2024-01-04 NOTE — Discharge Instructions (Signed)
Your lab tests exam and CT imaging are reassuring today.  You are being prescribed Protonix to help rule out your symptoms being secondary to GERD or acid reflux.  I do recommend follow-up with her primary MD if you have any new or persistent symptoms.  I am also referring you to a gastroenterologist if your symptoms are not improving with this treatment plan.

## 2024-01-04 NOTE — ED Notes (Signed)
Verbal order to disregard orthostatic vital signs.

## 2024-01-04 NOTE — ED Notes (Signed)
Pt requested nausea and pain medication. Provider notified.

## 2024-01-20 ENCOUNTER — Other Ambulatory Visit: Payer: Self-pay | Admitting: *Deleted

## 2024-01-20 DIAGNOSIS — E063 Autoimmune thyroiditis: Secondary | ICD-10-CM

## 2024-01-20 DIAGNOSIS — E039 Hypothyroidism, unspecified: Secondary | ICD-10-CM

## 2024-01-21 NOTE — Patient Instructions (Incomplete)

## 2024-01-22 ENCOUNTER — Ambulatory Visit: Payer: Self-pay | Admitting: Nurse Practitioner

## 2024-01-22 DIAGNOSIS — E063 Autoimmune thyroiditis: Secondary | ICD-10-CM

## 2024-02-15 LAB — T4, FREE: Free T4: 1.11 ng/dL (ref 0.82–1.77)

## 2024-02-15 LAB — TSH: TSH: 3.02 u[IU]/mL (ref 0.450–4.500)

## 2024-02-18 ENCOUNTER — Telehealth: Payer: Self-pay | Admitting: Nurse Practitioner

## 2024-02-18 ENCOUNTER — Ambulatory Visit: Admitting: Nurse Practitioner

## 2024-02-18 DIAGNOSIS — E063 Autoimmune thyroiditis: Secondary | ICD-10-CM

## 2024-02-18 NOTE — Telephone Encounter (Signed)
 Pt missed appt on 02/18/24, rescheduled for next available which is 5/15.  Can you put in new lab orders since they will need to be redone by then?

## 2024-02-18 NOTE — Telephone Encounter (Signed)
 done

## 2024-02-21 ENCOUNTER — Ambulatory Visit: Payer: Self-pay | Admitting: Nurse Practitioner

## 2024-04-15 ENCOUNTER — Other Ambulatory Visit: Payer: Self-pay

## 2024-04-15 ENCOUNTER — Emergency Department (HOSPITAL_COMMUNITY)
Admission: EM | Admit: 2024-04-15 | Discharge: 2024-04-16 | Disposition: A | Discharge status: Home / Self Care | Attending: Emergency Medicine | Admitting: Emergency Medicine

## 2024-04-15 ENCOUNTER — Encounter (HOSPITAL_COMMUNITY): Payer: Self-pay

## 2024-04-15 DIAGNOSIS — E039 Hypothyroidism, unspecified: Secondary | ICD-10-CM | POA: Diagnosis not present

## 2024-04-15 DIAGNOSIS — W228XXA Striking against or struck by other objects, initial encounter: Secondary | ICD-10-CM | POA: Diagnosis not present

## 2024-04-15 DIAGNOSIS — S8011XA Contusion of right lower leg, initial encounter: Secondary | ICD-10-CM | POA: Diagnosis not present

## 2024-04-15 DIAGNOSIS — S8991XA Unspecified injury of right lower leg, initial encounter: Secondary | ICD-10-CM | POA: Diagnosis present

## 2024-04-15 NOTE — ED Triage Notes (Signed)
 Pov from home. Cc of right upper leg pain. States a month ago she had a trailer hitch fall on her leg, says it was healing but recently accidentally hit again.  Has a knot above right knee for over a week. Able to ambulate on it but hurts.  Went to UC 2 days ago and was given NSAID but hasn't helped.

## 2024-04-16 ENCOUNTER — Emergency Department (HOSPITAL_COMMUNITY)

## 2024-04-16 LAB — CBC
HCT: 34.4 % — ABNORMAL LOW (ref 36.0–46.0)
Hemoglobin: 11.7 g/dL — ABNORMAL LOW (ref 12.0–15.0)
MCH: 31.6 pg (ref 26.0–34.0)
MCHC: 34 g/dL (ref 30.0–36.0)
MCV: 93 fL (ref 80.0–100.0)
Platelets: 162 10*3/uL (ref 150–400)
RBC: 3.7 MIL/uL — ABNORMAL LOW (ref 3.87–5.11)
RDW: 12.1 % (ref 11.5–15.5)
WBC: 5.6 10*3/uL (ref 4.0–10.5)
nRBC: 0 % (ref 0.0–0.2)

## 2024-04-16 LAB — BASIC METABOLIC PANEL WITH GFR
Anion gap: 6 (ref 5–15)
BUN: 19 mg/dL (ref 6–20)
CO2: 23 mmol/L (ref 22–32)
Calcium: 8.7 mg/dL — ABNORMAL LOW (ref 8.9–10.3)
Chloride: 108 mmol/L (ref 98–111)
Creatinine, Ser: 0.63 mg/dL (ref 0.44–1.00)
GFR, Estimated: >60 mL/min (ref >60)
Glucose, Bld: 115 mg/dL — ABNORMAL HIGH (ref 70–99)
Potassium: 3.4 mmol/L — ABNORMAL LOW (ref 3.5–5.1)
Sodium: 137 mmol/L (ref 135–145)

## 2024-04-16 MED ORDER — KETOROLAC TROMETHAMINE 15 MG/ML IJ SOLN
15.0000 mg | Freq: Once | INTRAMUSCULAR | Status: AC
  Administered 2024-04-16: 15 mg via INTRAVENOUS
  Filled 2024-04-16: qty 1

## 2024-04-16 MED ORDER — IOHEXOL 300 MG/ML  SOLN
100.0000 mL | Freq: Once | INTRAMUSCULAR | Status: AC | PRN
  Administered 2024-04-16: 100 mL via INTRAVENOUS

## 2024-04-16 NOTE — Discharge Instructions (Signed)
 You were evaluated in the Emergency Department and after careful evaluation, we did not find any emergent condition requiring admission or further testing in the hospital.  Your exam/testing today was overall reassuring.  Symptoms seem to be due to a fluid or blood collection within the leg.  Recommend follow-up with the surgery team for further management.  Rest the leg, continue the ketorolac  at home.  Please return to the Emergency Department if you experience any worsening of your condition.  Thank you for allowing us  to be a part of your care.

## 2024-04-16 NOTE — ED Provider Notes (Signed)
 [facility] Emergency Department Provider Note MRN:  [id number]  Arrival date & time: 04/16/24     Chief Complaint   Leg Injury (right)   History of Present Illness   Kathleen Grimes is a 23 y.o. year-old female with no pertinent past medical history presenting to the ED with chief complaint of leg injury.  Patient dropped a trailer hitch on her right thigh a few weeks ago.  This caused significant bruising and swelling to the area.  It was healing slowly but then she sustained a new more minor trauma about a week ago and this has caused the area to swell up traumatically, causing worsening pain by the day.  Denies any other injuries or complaints.  Review of Systems  A thorough review of systems was obtained and all systems are negative except as noted in the HPI and PMH.   Patient's Health History    Past Medical History:  Diagnosis Date   Abdominal migraine    Allergic rhinitis 03/13/2013   Allergy    Anemia    Hypothyroidism     Past Surgical History:  Procedure Laterality Date   CHOLECYSTECTOMY N/A 04/13/2020   Procedure: LAPAROSCOPIC CHOLECYSTECTOMY;  Surgeon: Awilda Bogus, MD;  Location: [facility];  Service: General;  Laterality: N/A;   CHOLECYSTECTOMY     CHOLECYSTECTOMY     WISDOM TOOTH EXTRACTION      Family History  Problem Relation Age of Onset   Cancer Father    Hypertension Father    Hyperlipidemia Father    Heart attack Father    Breast cancer Paternal Grandmother    Cancer Maternal Great-grandmother        bone marrow   Colon cancer Neg Hx    Esophageal cancer Neg Hx    Stomach cancer Neg Hx     Social History   Socioeconomic History   Marital status: Single    Spouse name: Not on file   Number of children: 1   Years of education: Not on file   Highest education level: Not on file  Occupational History   Not on file  Tobacco Use   Smoking status: Never   Smokeless tobacco: Never  Vaping Use   Vaping status:  Never Used  Substance and Sexual Activity   Alcohol use: Yes    Comment: 1-2 drinks a month   Drug use: Not Currently   Sexual activity: Not Currently    Birth control/protection: None  Other Topics Concern   Not on file  Social History Narrative   ** Merged History Encounter **       Social Drivers of Health   Financial Resource Strain: Medium Risk (03/29/2021)   Overall Financial Resource Strain (CARDIA)    Difficulty of Paying Living Expenses: Somewhat hard  Food Insecurity: No Food Insecurity (03/29/2021)   Hunger Vital Sign    Worried About Running Out of Food in the Last Year: Never true    Ran Out of Food in the Last Year: Never true  Transportation Needs: No Transportation Needs (03/29/2021)   PRAPARE - Administrator, Civil Service (Medical): No    Lack of Transportation (Non-Medical): No  Physical Activity: Sufficiently Active (03/29/2021)   Exercise Vital Sign    Days of Exercise per Week: 4 days    Minutes of Exercise per Session: 40 min  Stress: No Stress Concern Present (03/29/2021)   [employer] of Occupational Health - Occupational Stress Questionnaire  Feeling of Stress : Only a little  Social Connections: Socially Isolated (03/29/2021)   Social Connection and Isolation Panel [NHANES]    Frequency of Communication with Friends and Family: More than three times a week    Frequency of Social Gatherings with Friends and Family: Twice a week    Attends Religious Services: Never    Database administrator or Organizations: No    Attends Banker Meetings: Never    Marital Status: Never married  Intimate Partner Violence: Not At Risk (03/29/2021)   Humiliation, Afraid, Rape, and Kick questionnaire    Fear of Current or Ex-Partner: No    Emotionally Abused: No    Physically Abused: No    Sexually Abused: No     Physical Exam   Vitals:   04/15/24 2324 04/16/24 0140  BP: 105/61 110/65  Pulse: 65 63  Resp: 17 18  Temp: 98.1 F  (36.7 C) 98.1 F (36.7 C)  SpO2: 100% 98%    CONSTITUTIONAL: Well-appearing, NAD NEURO/PSYCH:  Alert and oriented x 3, no focal deficits EYES:  eyes equal and reactive ENT/NECK:  no LAD, no JVD CARDIO: Regular rate, well-perfused, normal S1 and S2 PULM:  CTAB no wheezing or rhonchi GI/GU:  non-distended, non-tender MSK/SPINE:  No gross deformities, no edema SKIN:  no rash, atraumatic   *Additional and/or pertinent findings included in MDM below  Diagnostic and Interventional Summary    EKG Interpretation Date/Time:    Ventricular Rate:    PR Interval:    QRS Duration:    QT Interval:    QTC Calculation:   R Axis:      Text Interpretation:         Labs Reviewed  CBC - Abnormal; Notable for the following components:      Result Value   RBC 3.70 (*)    Hemoglobin 11.7 (*)    HCT 34.4 (*)    All other components within normal limits  BASIC METABOLIC PANEL WITH GFR - Abnormal; Notable for the following components:   Potassium 3.4 (*)    Glucose, Bld 115 (*)    Calcium 8.7 (*)    All other components within normal limits    CT FEMUR RIGHT W CONTRAST  Final Result      Medications  ketorolac  (TORADOL ) 15 MG/ML injection 15 mg (15 mg Intravenous Given 04/16/24 0054)  iohexol  (OMNIPAQUE ) 300 MG/ML solution 100 mL (100 mLs Intravenous Contrast Given 04/16/24 0213)     Procedures  /  Critical Care Procedures  ED Course and Medical Decision Making  Initial Impression and Ddx Patient has a large palpable likely hematoma to the right lower thigh.  The leg is neurovascularly intact distally.  The concern would be that she sustained some type of vascular trauma during the initial injury weeks ago and now it is easily irritated and there may be some active venous extravasation.  Obtaining CT.  Past medical/surgical history that increases complexity of ED encounter: None  Interpretation of Diagnostics I personally reviewed the Laboratory Testing and my interpretation is as  follows: No significant blood count or electrolyte disturbance.  CT confirms either seroma or hematoma, no signs of active extravasation  Patient Reassessment and Ultimate Disposition/Management     Patient is appropriate for discharge with follow-up.  Patient management required discussion with the following services or consulting groups:  None  Complexity of Problems Addressed Acute illness or injury that poses threat of life of bodily function  Additional Data Reviewed  and Analyzed Further history obtained from: Prior labs/imaging results  Additional Factors Impacting ED Encounter Risk Consideration of hospitalization  Merrick Abe. Harless Lien, MD [facility] Emergency Medicine [facility] [email or web address]  Final Clinical Impressions(s) / ED Diagnoses     ICD-10-CM   1. Hematoma of right lower extremity, initial encounter  S80.11XA       ED Discharge Orders     None        Discharge Instructions Discussed with and Provided to Patient:     Discharge Instructions      You were evaluated in the Emergency Department and after careful evaluation, we did not find any emergent condition requiring admission or further testing in the hospital.  Your exam/testing today was overall reassuring.  Symptoms seem to be due to a fluid or blood collection within the leg.  Recommend follow-up with the surgery team for further management.  Rest the leg, continue the ketorolac  at home.  Please return to the Emergency Department if you experience any worsening of your condition.  Thank you for allowing us  to be a part of your care.        Edson Graces, MD 04/16/24 [phone]

## 2024-04-21 ENCOUNTER — Encounter: Payer: Self-pay | Admitting: Orthopedic Surgery

## 2024-04-21 ENCOUNTER — Ambulatory Visit (INDEPENDENT_AMBULATORY_CARE_PROVIDER_SITE_OTHER): Admitting: Orthopedic Surgery

## 2024-04-21 VITALS — Ht 67.0 in | Wt 169.0 lb

## 2024-04-21 DIAGNOSIS — T148XXA Other injury of unspecified body region, initial encounter: Secondary | ICD-10-CM

## 2024-04-21 NOTE — Patient Instructions (Signed)

## 2024-04-21 NOTE — Progress Notes (Signed)
 New Patient Visit  Assessment: Kathleen Grimes is a 23 y.o. female with the following: 1. Hematoma and contusion  Plan: Kathleen Grimes sustained a contusion, with associated hematoma in the distal right thigh.  Injury was sustained about a month ago.  There is been slow resolution of her symptoms.  She was seen in the ED, and CT scan demonstrated a fluid collection.  On physical exam today, I feel as though that fluid collection is smaller.  I do not think that the skin is compromised.  No surgical intervention is warranted.  I did recommend compression, and working on range of motion.  Continue with icing, and medications as needed.  I would like to see her back in approximately 3 weeks.  If she continues to have issues at that time, I would recommend physical therapy.  Follow-up: Return in about 3 weeks (around 05/12/2024).  Subjective:  Chief Complaint  Patient presents with   Leg Injury    R leg after having a trailer fall on leg above the knee. Pt states injury happened approx 03/28/24. Went to ED had CT done because pt still has a lot of swelling, numbness and pain after prolonged standing. 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     History of Present Illness: Kathleen Grimes is a 24 y.o. female who presents for evaluation of right thigh pain.  Approximately 1 month ago, a trailer fell on her right leg.  She had a superficial abrasion, with slow improvement of her symptoms.  There was a lot of bruising and swelling.  More recently, her husband accidentally hit her right leg, which worsened her pain.  As a result, she did end up seeking evaluation at the emergency department.  In the ED, CT scan was obtained.  She has been taking NSAIDs.  She she got a shot of Toradol .  She has not tried any compression.  She has been icing her leg.  She has not worked with therapy, work on the exercises on her own.  She states that she works long hours, and is on her feet.  Pain tends to get worse  throughout the day.   Review of Systems: No fevers or chills No numbness or tingling No chest pain No shortness of breath No bowel or bladder dysfunction No GI distress No headaches   Medical History:  Past Medical History:  Diagnosis Date   Abdominal migraine    Allergic rhinitis 03/13/2013   Allergy    Anemia    Hypothyroidism     Past Surgical History:  Procedure Laterality Date   CHOLECYSTECTOMY N/A 04/13/2020   Procedure: LAPAROSCOPIC CHOLECYSTECTOMY;  Surgeon: Awilda Bogus, MD;  Location: [facility];  Service: General;  Laterality: N/A;   CHOLECYSTECTOMY     CHOLECYSTECTOMY     WISDOM TOOTH EXTRACTION      Family History  Problem Relation Age of Onset   Cancer Father    Hypertension Father    Hyperlipidemia Father    Heart attack Father    Breast cancer Paternal Grandmother    Cancer Maternal Great-grandmother        bone marrow   Colon cancer Neg Hx    Esophageal cancer Neg Hx    Stomach cancer Neg Hx    Social History   Tobacco Use   Smoking status: Never   Smokeless tobacco: Never  Vaping Use   Vaping status: Never Used  Substance Use Topics   Alcohol  use: Yes    Comment: 1-2 drinks a month   Drug use: Not Currently    Allergies  Allergen Reactions   Hydrocodone  Nausea Only   Hydrocodone  Nausea And Vomiting    Current Meds  Medication Sig   cetirizine  (ZYRTEC  ALLERGY) 10 MG tablet Take 1 tablet (10 mg total) by mouth at bedtime.   levothyroxine  (SYNTHROID ) 75 MCG tablet Take 1 tablet (75 mcg total) by mouth daily before breakfast.   pantoprazole  (PROTONIX ) 20 MG tablet Take 1 tablet (20 mg total) by mouth daily.    Objective: Ht 5\' 7"  (1.702 m)   Wt 169 lb (76.7 kg)   LMP 04/15/2024   BMI 26.47 kg/m   Physical Exam:  General: Alert and oriented. and No acute distress. Gait: Right sided antalgic gait.  Evaluation of the right thigh demonstrates residual bruising over the distal thigh, extending to the medial aspect of the  right knee.  There is a superficial abrasion, that is almost completely healed.  She does have some tenderness in the distal thigh.  Small fluid collection.  There is no firm fluid collection.  There is no skin compromise.  Passively, I can fully extend her knee.  She is able to actively extend her knee, but it is painful.  IMAGING: I personally reviewed images previously obtained from the ED  CT scan femur  IMPRESSION: 1. No acute osseous abnormality of the right lower extremity. 2. Homogeneous water attenuation subcutaneous fluid collection within the suprapatellar region measuring 2.7 x 5.5 x 7.8 cm in greatest dimension most in keeping with a remote hematoma or posttraumatic seroma. 3. Periligamentous edema surrounding the medial and lateral patellar retinacular which may relate to inflammation or trauma involving the structures. The structures do, however, appear intact.    New Medications:  No orders of the defined types were placed in this encounter.     Tonita Frater, MD  04/21/2024 9:52 AM

## 2024-04-29 NOTE — Patient Instructions (Incomplete)

## 2024-04-30 ENCOUNTER — Ambulatory Visit: Admitting: Nurse Practitioner

## 2024-05-12 ENCOUNTER — Ambulatory Visit: Admitting: Orthopedic Surgery

## 2024-05-20 ENCOUNTER — Encounter: Payer: Self-pay | Admitting: Orthopedic Surgery

## 2024-05-20 ENCOUNTER — Ambulatory Visit: Admitting: Orthopedic Surgery

## 2024-05-20 DIAGNOSIS — T148XXA Other injury of unspecified body region, initial encounter: Secondary | ICD-10-CM

## 2024-05-20 NOTE — Progress Notes (Signed)
 Return Patient Visit  Assessment: Kathleen Grimes is a 23 y.o. female with the following: 1. Hematoma and contusion  Plan: Kathleen Grimes sustained a contusion, with associated hematoma in the distal right thigh.  Injury was sustained a couple of months ago.  Swelling is much better.  Mild residual bruising.  Continue with medications as needed.  Compression and ice.  No surgical intervention is needed.  Will continue to resolve.  Pain, tenderness and numbness will gradually resolve.   Follow-up: Return if symptoms worsen or fail to improve.  Subjective:  Chief Complaint  Patient presents with   Leg Pain    R leg after an injury DOI 03/28/24 does feel better but still has pain with walking and numbness below area of bruise.     History of Present Illness: Kathleen Grimes is a 23 y.o. female who returns for evaluation of right thigh pain.  Approximately 2 months ago, a trailer fell on her right leg.  Swelling and bruising is much better.  She still has some pain in the area of the abrasion.  Residual bruising.  She notes some numbness in the area.  She has been compressing with a sleeve.  She feels better overall.    Review of Systems: No fevers or chills No numbness or tingling No chest pain No shortness of breath No bowel or bladder dysfunction No GI distress No headaches    Objective: There were no vitals taken for this visit.  Physical Exam:  General: Alert and oriented. and No acute distress. Gait: Right sided antalgic gait.  Evaluation of the right thigh demonstrates residual bruising over the distal thigh, comparing to prior.  There is a superficial abrasion, that is barely visible.  She does have some tenderness in the distal thigh.  Numbness in the area of the healed laceration.  Good range of motion.  Ambulating well.   IMAGING: No new imaging obtained today   New Medications:  No orders of the defined types were placed in this encounter.     Tonita Frater, MD  05/20/2024 10:59 PM

## 2024-07-03 ENCOUNTER — Encounter: Payer: Self-pay | Admitting: Obstetrics and Gynecology

## 2024-07-03 ENCOUNTER — Ambulatory Visit: Admitting: Obstetrics and Gynecology

## 2024-07-03 VITALS — Ht 67.0 in | Wt 175.4 lb

## 2024-07-03 DIAGNOSIS — N939 Abnormal uterine and vaginal bleeding, unspecified: Secondary | ICD-10-CM | POA: Diagnosis not present

## 2024-07-03 DIAGNOSIS — Z8639 Personal history of other endocrine, nutritional and metabolic disease: Secondary | ICD-10-CM

## 2024-07-03 NOTE — Progress Notes (Signed)
   GYNECOLOGY PROGRESS NOTE  History:  23 y.o. G1P1001 presents to [facility] Has been off birth control for around one year. Reports irregular periods. Will have cycle for one week, go off cycle and then bleed for another 2-7 days a few days later.   Hx of hypothyroidism she reports she is not managing well. Has not taken medication for thyroid  in a long time.  Has endocrinologist, but has not been seen d/t not having lab work completed  She reports she is TTC for the past 8 months   Had a pap smear beginning this year at the health [facility]  Health Maintenance Due  Topic Date Due   Pneumococcal Vaccine 49-75 Years old (1 of 1 - PPSV23, PCV20, or PCV21) 01/03/2006   HPV VACCINES (1 - 3-dose series) Never done   CHLAMYDIA SCREENING  03/29/2022   COVID-19 Vaccine (1 - 2024-25 season) Never done   Cervical Cancer Screening (Pap smear)  03/29/2024     Review of Systems:  Pertinent items are noted in HPI.   Objective:  Physical Exam Height 5' 7 (1.702 m), weight 175 lb 6.4 oz (79.6 kg), last menstrual period 06/06/2024. VS reviewed, nursing note reviewed,  Constitutional: well developed, well nourished, no distress HEENT: normocephalic Pulm/chest wall: normal effort Abdomen: soft Neuro: alert and oriented  Skin: warm, dry Psych: affect normal Pelvic exam: deferred  Assessment & Plan:  1. History of hypothyroidism (Primary) Checking blood work today given hx of hypothyroidism - TSH Rfx on Abnormal to Free T4 - T3, free  2. Abnormal uterine bleeding (AUB) Does not desire hormonal management at this time due to TTC - TSH Rfx on Abnormal to Free T4 - CBC - Prolactin - Testosterone ,Free and Total - FSH/LH    Nidia Daring, FNP

## 2024-07-04 LAB — T3, FREE: T3, Free: 3 pg/mL (ref 2.0–4.4)

## 2024-07-07 ENCOUNTER — Ambulatory Visit: Payer: Self-pay | Admitting: Obstetrics and Gynecology

## 2024-07-07 LAB — CBC
Hematocrit: 39.6 % (ref 34.0–46.6)
Hemoglobin: 13.5 g/dL (ref 11.1–15.9)
MCH: 32.7 pg (ref 26.6–33.0)
MCHC: 34.1 g/dL (ref 31.5–35.7)
MCV: 96 fL (ref 79–97)
Platelets: 171 x10E3/uL (ref 150–450)
RBC: 4.13 x10E6/uL (ref 3.77–5.28)
RDW: 12 % (ref 11.7–15.4)
WBC: 4.4 x10E3/uL (ref 3.4–10.8)

## 2024-07-07 LAB — FSH/LH
FSH: 4.2 m[IU]/mL
LH: 1.8 m[IU]/mL

## 2024-07-07 LAB — PROLACTIN: Prolactin: 10.8 ng/mL (ref 4.8–33.4)

## 2024-07-07 LAB — TESTOSTERONE,FREE AND TOTAL
Testosterone, Free: 0.3 pg/mL (ref 0.0–4.2)
Testosterone: 17 ng/dL (ref 13–71)

## 2024-07-07 LAB — TSH RFX ON ABNORMAL TO FREE T4: TSH: 3.88 u[IU]/mL (ref 0.450–4.500)

## 2024-07-10 ENCOUNTER — Ambulatory Visit

## 2024-07-10 ENCOUNTER — Other Ambulatory Visit (HOSPITAL_COMMUNITY)
Admission: RE | Admit: 2024-07-10 | Discharge: 2024-07-10 | Disposition: A | Discharge status: Home / Self Care | Source: Ambulatory Visit | Attending: Obstetrics & Gynecology | Admitting: Obstetrics & Gynecology

## 2024-07-10 DIAGNOSIS — N926 Irregular menstruation, unspecified: Secondary | ICD-10-CM | POA: Insufficient documentation

## 2024-07-10 DIAGNOSIS — Z113 Encounter for screening for infections with a predominantly sexual mode of transmission: Secondary | ICD-10-CM

## 2024-07-10 NOTE — Progress Notes (Signed)
   NURSE VISIT- VAGINITIS/STD  SUBJECTIVE:  Kathleen Grimes is a 23 y.o. G1P1001 GYN patientfemale here for a vaginal swab for vaginitis screening.  She reports the following symptoms: abnormal bleeding: irregular bleeding . Sometimes has bleeding twice in a month.  Denies abnormal vaginal bleeding, significant pelvic pain, fever, or UTI symptoms.  OBJECTIVE:  LMP 06/06/2024   Appears well, in no apparent distress  ASSESSMENT: Vaginal swab for vaginitis screening  PLAN: Self-collected vaginal probe for Gonorrhea, Chlamydia, Trichomonas, Bacterial Vaginosis, Yeast sent to lab Treatment: to be determined once results are received Follow-up as needed if symptoms persist/worsen, or new symptoms develop  Kamden Reber  07/10/2024 9:25 AM

## 2024-07-14 ENCOUNTER — Ambulatory Visit: Payer: Self-pay | Admitting: Obstetrics & Gynecology

## 2024-07-14 LAB — CERVICOVAGINAL ANCILLARY ONLY
Bacterial Vaginitis (gardnerella): NEGATIVE
Candida Glabrata: NEGATIVE
Candida Vaginitis: NEGATIVE
Chlamydia: NEGATIVE
Comment: NEGATIVE
Comment: NEGATIVE
Comment: NEGATIVE
Comment: NEGATIVE
Comment: NEGATIVE
Comment: NORMAL
Neisseria Gonorrhea: NEGATIVE
Trichomonas: NEGATIVE

## 2024-11-30 ENCOUNTER — Ambulatory Visit

## 2024-11-30 DIAGNOSIS — Z3201 Encounter for pregnancy test, result positive: Secondary | ICD-10-CM

## 2024-11-30 LAB — POCT URINE PREGNANCY: Preg Test, Ur: POSITIVE — AB

## 2024-11-30 NOTE — Progress Notes (Signed)
° °  NURSE VISIT- PREGNANCY CONFIRMATION   SUBJECTIVE:  Kathleen Grimes is a 23 y.o. G36P1001 female at [redacted]w[redacted]d by certain LMP of Patient's last menstrual period was 10/27/2024. Here for pregnancy confirmation.  Home pregnancy test: positive x 4  She reports nausea.  She is taking prenatal vitamins.    OBJECTIVE:  LMP 10/27/2024   Appears well, in no apparent distress  Results for orders placed or performed in visit on 11/30/24 (from the past 24 hours)  POCT urine pregnancy   Collection Time: 11/30/24 10:07 AM  Result Value Ref Range   Preg Test, Ur Positive (A) Negative    ASSESSMENT: Positive pregnancy test, [redacted]w[redacted]d by LMP    PLAN: Schedule for dating ultrasound in 3 weeks Prenatal vitamins: continue   Nausea medicines: not currently needed   OB packet given: Yes  Alan LITTIE Fischer  11/30/2024 10:11 AM

## 2024-12-04 ENCOUNTER — Ambulatory Visit
Admission: EM | Admit: 2024-12-04 | Discharge: 2024-12-04 | Disposition: A | Discharge status: Home / Self Care | Attending: Family Medicine | Admitting: Family Medicine

## 2024-12-04 ENCOUNTER — Other Ambulatory Visit: Payer: Self-pay

## 2024-12-04 DIAGNOSIS — J029 Acute pharyngitis, unspecified: Secondary | ICD-10-CM

## 2024-12-04 DIAGNOSIS — J069 Acute upper respiratory infection, unspecified: Secondary | ICD-10-CM | POA: Diagnosis not present

## 2024-12-04 LAB — POC COVID19/FLU A&B COMBO
Covid Antigen, POC: NEGATIVE
Influenza A Antigen, POC: NEGATIVE
Influenza B Antigen, POC: NEGATIVE

## 2024-12-04 LAB — POCT RAPID STREP A (OFFICE): Rapid Strep A Screen: NEGATIVE

## 2024-12-04 MED ORDER — LIDOCAINE VISCOUS HCL 2 % MT SOLN: 10.0000 mL | OROMUCOSAL | 0 refills | Status: AC | PRN

## 2024-12-04 NOTE — ED Provider Notes (Signed)
 " [facility]    CSN: [id number] Arrival date & time: 12/04/24  1342      History   Chief Complaint Chief Complaint  Patient presents with   Sore Throat    HPI Kathleen Grimes is a 23 y.o. female.   Patient presenting today with a sore throat, headache, fatigue that started this morning.  She denies significant congestion, cough, chest pain, shortness of breath, abdominal pain, vomiting, diarrhea.  So far not trying anything over-the-counter for symptoms.  Recent sick contact at work with similar symptoms.  Currently [redacted] weeks pregnant.    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   Elevated LFTs 04/26/2020   Hx of cholecystectomy 04/25/2020   Calculus of gallbladder without cholecystitis without obstruction 04/06/2020   Abdominal migraine 02/22/2015   Cyclical vomiting 02/22/2015   Allergic rhinitis 03/13/2013    Past Surgical History:  Procedure Laterality Date   CHOLECYSTECTOMY N/A 04/13/2020   Procedure: LAPAROSCOPIC CHOLECYSTECTOMY;  Surgeon: Kallie Manuelita BROCKS, MD;  Location: [facility];  Service: General;  Laterality: N/A;   CHOLECYSTECTOMY     CHOLECYSTECTOMY     WISDOM TOOTH EXTRACTION      OB History     Gravida  2   Para  1   Term  1   Preterm  0   AB  0   Living  1      SAB  0   IAB  0   Ectopic  0   Multiple  0   Live Births  1            Home Medications    Prior to Admission medications  Medication Sig Start Date End Date Taking? Authorizing Provider  lidocaine  (XYLOCAINE ) 2 % solution Use as directed 10 mLs in the mouth or throat every 3 (three) hours as needed. 12/04/24  Yes Stuart Vernell Norris, PA-C    Family History Family History  Problem Relation Age of Onset    Cancer Father    Hypertension Father    Hyperlipidemia Father    Heart attack Father    Breast cancer Paternal Grandmother    Cancer Maternal Great-grandmother        bone marrow   Colon cancer Neg Hx    Esophageal cancer Neg Hx    Stomach cancer Neg Hx     Social History Social History[1]   Allergies   Hydrocodone  and Hydrocodone    Review of Systems Review of Systems Per HPI  Physical Exam Triage Vital Signs ED Triage Vitals  Encounter Vitals Group     BP 12/04/24 1454 102/63     Girls Systolic BP Percentile --      Girls Diastolic BP Percentile --      Boys Systolic BP Percentile --      Boys Diastolic BP Percentile --      Pulse Rate 12/04/24 1454 81     Resp 12/04/24 1454 17     Temp 12/04/24 1454 98.9 F (37.2 C)     Temp Source 12/04/24 1454 Oral     SpO2 12/04/24 1454 98 %     Weight --      Height --  Head Circumference --      Peak Flow --      Pain Score 12/04/24 1453 0     Pain Loc --      Pain Education --      Exclude from Growth Chart --    No data found.  Updated Vital Signs BP 102/63 (BP Location: Right Arm)   Pulse 81   Temp 98.9 F (37.2 C) (Oral)   Resp 17   LMP 10/27/2024   SpO2 98%   Visual Acuity Right Eye Distance:   Left Eye Distance:   Bilateral Distance:    Right Eye Near:   Left Eye Near:    Bilateral Near:     Physical Exam Vitals and nursing note reviewed.  Constitutional:      Appearance: Normal appearance.  HENT:     Head: Atraumatic.     Right Ear: Tympanic membrane and external ear normal.     Left Ear: Tympanic membrane and external ear normal.     Nose: Nose normal.     Mouth/Throat:     Mouth: Mucous membranes are moist.     Pharynx: Posterior oropharyngeal erythema present. No oropharyngeal exudate.  Eyes:     Extraocular Movements: Extraocular movements intact.     Conjunctiva/sclera: Conjunctivae normal.  Cardiovascular:     Rate and Rhythm: Normal rate and regular rhythm.     Heart  sounds: Normal heart sounds.  Pulmonary:     Effort: Pulmonary effort is normal.     Breath sounds: Normal breath sounds. No wheezing or rales.  Musculoskeletal:        General: Normal range of motion.     Cervical back: Normal range of motion and neck supple.  Lymphadenopathy:     Cervical: No cervical adenopathy.  Skin:    General: Skin is warm and dry.  Neurological:     Mental Status: She is alert and oriented to person, place, and time.  Psychiatric:        Mood and Affect: Mood normal.        Thought Content: Thought content normal.      UC Treatments / Results  Labs (all labs ordered are listed, but only abnormal results are displayed) Labs Reviewed  POCT RAPID STREP A (OFFICE) - Normal  POC COVID19/FLU A&B COMBO - Normal    EKG   Radiology No results found.  Procedures Procedures (including critical care time)  Medications Ordered in UC Medications - No data to display  Initial Impression / Assessment and Plan / UC Course  I have reviewed the triage vital signs and the nursing notes.  Pertinent labs & imaging results that were available during my care of the patient were reviewed by me and considered in my medical decision making (see chart for details).     Rapid strep, COVID and flu all negative.  Suspect viral respiratory infection.  Treat with viscous lidocaine , supportive over-the-counter medications and home care.  Return for worsening or unresolving symptoms.  Work note given.  Final Clinical Impressions(s) / UC Diagnoses   Final diagnoses:  Sore throat  Viral URI   Discharge Instructions   None    ED Prescriptions     Medication Sig Dispense Auth. Provider   lidocaine  (XYLOCAINE ) 2 % solution Use as directed 10 mLs in the mouth or throat every 3 (three) hours as needed. 100 mL Stuart Vernell Norris, [place]      PDMP not reviewed this encounter.    [1]  Social History Tobacco Use   Smoking status: Never   Smokeless tobacco: Never   Vaping Use   Vaping status: Never Used  Substance Use Topics   Alcohol use: Yes    Comment: 1-2 drinks a month   Drug use: Not Currently     Stuart Vernell Norris, PA-C 12/04/24 1601  "

## 2024-12-04 NOTE — ED Triage Notes (Signed)
 Pt is here with a sore throat that started this morning, pt has not taken any meds to relieve discomfort.

## 2024-12-16 ENCOUNTER — Other Ambulatory Visit: Payer: Self-pay | Admitting: Obstetrics & Gynecology

## 2024-12-16 DIAGNOSIS — O3680X1 Pregnancy with inconclusive fetal viability, fetus 1: Secondary | ICD-10-CM

## 2024-12-21 ENCOUNTER — Other Ambulatory Visit

## 2024-12-22 ENCOUNTER — Other Ambulatory Visit: Payer: Self-pay | Admitting: Women's Health

## 2024-12-22 MED ORDER — DOXYLAMINE-PYRIDOXINE 10-10 MG PO TBEC: DELAYED_RELEASE_TABLET | ORAL | 6 refills | Status: AC

## 2024-12-23 ENCOUNTER — Other Ambulatory Visit: Payer: Self-pay | Admitting: Women's Health

## 2024-12-23 MED ORDER — PROMETHAZINE HCL 25 MG PO TABS: 12.5000 mg | ORAL_TABLET | Freq: Four times a day (QID) | ORAL | 6 refills | Status: AC | PRN

## 2024-12-25 ENCOUNTER — Ambulatory Visit

## 2024-12-25 DIAGNOSIS — O3680X1 Pregnancy with inconclusive fetal viability, fetus 1: Secondary | ICD-10-CM

## 2024-12-25 NOTE — Progress Notes (Signed)
 US  8+3 wks,single IUP with yolk sac,CRL 16.68 mm,normal ovaries,FHR 158 bpm

## 2025-01-27 ENCOUNTER — Ambulatory Visit: Admitting: *Deleted

## 2025-01-27 ENCOUNTER — Other Ambulatory Visit

## 2025-01-27 ENCOUNTER — Encounter: Admitting: Advanced Practice Midwife
